# Patient Record
Sex: Female | Born: 1935 | Race: White | Hispanic: No | Smoking: Never smoker
Health system: Southern US, Community
[De-identification: ages and names within clinical notes are randomized; demographics above are authoritative.]

## PROBLEM LIST (undated history)

## (undated) DIAGNOSIS — D509 Iron deficiency anemia, unspecified: Secondary | ICD-10-CM

## (undated) DIAGNOSIS — I10 Essential (primary) hypertension: Secondary | ICD-10-CM

## (undated) DIAGNOSIS — K591 Functional diarrhea: Secondary | ICD-10-CM

## (undated) DIAGNOSIS — R1310 Dysphagia, unspecified: Secondary | ICD-10-CM

## (undated) DIAGNOSIS — R1319 Other dysphagia: Secondary | ICD-10-CM

## (undated) DIAGNOSIS — E039 Hypothyroidism, unspecified: Secondary | ICD-10-CM

## (undated) DIAGNOSIS — K219 Gastro-esophageal reflux disease without esophagitis: Secondary | ICD-10-CM

## (undated) HISTORY — DX: Gastro-esophageal reflux disease without esophagitis: K21.9

## (undated) HISTORY — DX: Iron deficiency anemia, unspecified: D50.9

## (undated) HISTORY — DX: Functional diarrhea: K59.1

## (undated) HISTORY — DX: Essential (primary) hypertension: I10

## (undated) HISTORY — PX: ABDOMINAL HYSTERECTOMY: SHX81

## (undated) HISTORY — DX: Other dysphagia: R13.19

## (undated) HISTORY — DX: Hypothyroidism, unspecified: E03.9

---
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
## 2011-01-09 DIAGNOSIS — R609 Edema, unspecified: Secondary | ICD-10-CM | POA: Diagnosis not present

## 2011-01-09 DIAGNOSIS — I10 Essential (primary) hypertension: Secondary | ICD-10-CM | POA: Diagnosis not present

## 2011-01-09 DIAGNOSIS — Z6828 Body mass index (BMI) 28.0-28.9, adult: Secondary | ICD-10-CM | POA: Diagnosis not present

## 2011-01-16 DIAGNOSIS — I8310 Varicose veins of unspecified lower extremity with inflammation: Secondary | ICD-10-CM | POA: Diagnosis not present

## 2011-01-16 DIAGNOSIS — J309 Allergic rhinitis, unspecified: Secondary | ICD-10-CM | POA: Diagnosis not present

## 2011-02-06 DIAGNOSIS — J309 Allergic rhinitis, unspecified: Secondary | ICD-10-CM | POA: Diagnosis not present

## 2011-02-27 DIAGNOSIS — J309 Allergic rhinitis, unspecified: Secondary | ICD-10-CM | POA: Diagnosis not present

## 2011-03-06 DIAGNOSIS — E785 Hyperlipidemia, unspecified: Secondary | ICD-10-CM | POA: Diagnosis not present

## 2011-03-06 DIAGNOSIS — I10 Essential (primary) hypertension: Secondary | ICD-10-CM | POA: Diagnosis not present

## 2011-03-06 DIAGNOSIS — E039 Hypothyroidism, unspecified: Secondary | ICD-10-CM | POA: Diagnosis not present

## 2011-03-06 DIAGNOSIS — Z79899 Other long term (current) drug therapy: Secondary | ICD-10-CM | POA: Diagnosis not present

## 2011-03-06 DIAGNOSIS — E119 Type 2 diabetes mellitus without complications: Secondary | ICD-10-CM | POA: Diagnosis not present

## 2011-03-06 DIAGNOSIS — Z6828 Body mass index (BMI) 28.0-28.9, adult: Secondary | ICD-10-CM | POA: Diagnosis not present

## 2011-03-06 DIAGNOSIS — R609 Edema, unspecified: Secondary | ICD-10-CM | POA: Diagnosis not present

## 2011-03-06 DIAGNOSIS — J309 Allergic rhinitis, unspecified: Secondary | ICD-10-CM | POA: Diagnosis not present

## 2011-03-10 DIAGNOSIS — M19049 Primary osteoarthritis, unspecified hand: Secondary | ICD-10-CM | POA: Diagnosis not present

## 2011-03-10 DIAGNOSIS — Z6827 Body mass index (BMI) 27.0-27.9, adult: Secondary | ICD-10-CM | POA: Diagnosis not present

## 2011-03-14 DIAGNOSIS — J309 Allergic rhinitis, unspecified: Secondary | ICD-10-CM | POA: Diagnosis not present

## 2011-04-09 DIAGNOSIS — J309 Allergic rhinitis, unspecified: Secondary | ICD-10-CM | POA: Diagnosis not present

## 2011-04-17 DIAGNOSIS — J309 Allergic rhinitis, unspecified: Secondary | ICD-10-CM | POA: Diagnosis not present

## 2011-04-24 DIAGNOSIS — J309 Allergic rhinitis, unspecified: Secondary | ICD-10-CM | POA: Diagnosis not present

## 2011-05-08 DIAGNOSIS — J309 Allergic rhinitis, unspecified: Secondary | ICD-10-CM | POA: Diagnosis not present

## 2011-05-16 DIAGNOSIS — K219 Gastro-esophageal reflux disease without esophagitis: Secondary | ICD-10-CM | POA: Diagnosis not present

## 2011-05-21 DIAGNOSIS — E039 Hypothyroidism, unspecified: Secondary | ICD-10-CM | POA: Diagnosis not present

## 2011-05-21 DIAGNOSIS — I10 Essential (primary) hypertension: Secondary | ICD-10-CM | POA: Diagnosis not present

## 2011-05-21 DIAGNOSIS — K222 Esophageal obstruction: Secondary | ICD-10-CM | POA: Diagnosis not present

## 2011-05-21 DIAGNOSIS — Z79899 Other long term (current) drug therapy: Secondary | ICD-10-CM | POA: Diagnosis not present

## 2011-05-21 DIAGNOSIS — K449 Diaphragmatic hernia without obstruction or gangrene: Secondary | ICD-10-CM | POA: Diagnosis not present

## 2011-05-21 DIAGNOSIS — Z7982 Long term (current) use of aspirin: Secondary | ICD-10-CM | POA: Diagnosis not present

## 2011-05-21 DIAGNOSIS — K219 Gastro-esophageal reflux disease without esophagitis: Secondary | ICD-10-CM | POA: Diagnosis not present

## 2011-05-21 DIAGNOSIS — Q391 Atresia of esophagus with tracheo-esophageal fistula: Secondary | ICD-10-CM | POA: Diagnosis not present

## 2011-05-21 DIAGNOSIS — R1310 Dysphagia, unspecified: Secondary | ICD-10-CM | POA: Diagnosis not present

## 2011-06-09 DIAGNOSIS — J309 Allergic rhinitis, unspecified: Secondary | ICD-10-CM | POA: Diagnosis not present

## 2011-06-11 DIAGNOSIS — R609 Edema, unspecified: Secondary | ICD-10-CM | POA: Diagnosis not present

## 2011-06-11 DIAGNOSIS — I10 Essential (primary) hypertension: Secondary | ICD-10-CM | POA: Diagnosis not present

## 2011-06-11 DIAGNOSIS — E039 Hypothyroidism, unspecified: Secondary | ICD-10-CM | POA: Diagnosis not present

## 2011-06-11 DIAGNOSIS — E785 Hyperlipidemia, unspecified: Secondary | ICD-10-CM | POA: Diagnosis not present

## 2011-06-11 DIAGNOSIS — Z6828 Body mass index (BMI) 28.0-28.9, adult: Secondary | ICD-10-CM | POA: Diagnosis not present

## 2011-06-11 DIAGNOSIS — E119 Type 2 diabetes mellitus without complications: Secondary | ICD-10-CM | POA: Diagnosis not present

## 2011-08-07 DIAGNOSIS — J309 Allergic rhinitis, unspecified: Secondary | ICD-10-CM | POA: Diagnosis not present

## 2011-08-12 DIAGNOSIS — E039 Hypothyroidism, unspecified: Secondary | ICD-10-CM | POA: Diagnosis not present

## 2011-09-03 DIAGNOSIS — H66009 Acute suppurative otitis media without spontaneous rupture of ear drum, unspecified ear: Secondary | ICD-10-CM | POA: Diagnosis not present

## 2011-09-03 DIAGNOSIS — H00019 Hordeolum externum unspecified eye, unspecified eyelid: Secondary | ICD-10-CM | POA: Diagnosis not present

## 2011-10-13 DIAGNOSIS — E119 Type 2 diabetes mellitus without complications: Secondary | ICD-10-CM | POA: Diagnosis not present

## 2011-10-13 DIAGNOSIS — H1045 Other chronic allergic conjunctivitis: Secondary | ICD-10-CM | POA: Diagnosis not present

## 2011-10-13 DIAGNOSIS — H2589 Other age-related cataract: Secondary | ICD-10-CM | POA: Diagnosis not present

## 2011-10-13 DIAGNOSIS — H524 Presbyopia: Secondary | ICD-10-CM | POA: Diagnosis not present

## 2011-10-13 DIAGNOSIS — H04129 Dry eye syndrome of unspecified lacrimal gland: Secondary | ICD-10-CM | POA: Diagnosis not present

## 2011-12-17 DIAGNOSIS — Z6829 Body mass index (BMI) 29.0-29.9, adult: Secondary | ICD-10-CM | POA: Diagnosis not present

## 2011-12-17 DIAGNOSIS — E119 Type 2 diabetes mellitus without complications: Secondary | ICD-10-CM | POA: Diagnosis not present

## 2011-12-17 DIAGNOSIS — I10 Essential (primary) hypertension: Secondary | ICD-10-CM | POA: Diagnosis not present

## 2011-12-17 DIAGNOSIS — R609 Edema, unspecified: Secondary | ICD-10-CM | POA: Diagnosis not present

## 2011-12-17 DIAGNOSIS — E785 Hyperlipidemia, unspecified: Secondary | ICD-10-CM | POA: Diagnosis not present

## 2011-12-17 DIAGNOSIS — E039 Hypothyroidism, unspecified: Secondary | ICD-10-CM | POA: Diagnosis not present

## 2012-01-06 DIAGNOSIS — Z1231 Encounter for screening mammogram for malignant neoplasm of breast: Secondary | ICD-10-CM | POA: Diagnosis not present

## 2012-02-10 DIAGNOSIS — R1013 Epigastric pain: Secondary | ICD-10-CM | POA: Diagnosis not present

## 2012-02-10 DIAGNOSIS — R112 Nausea with vomiting, unspecified: Secondary | ICD-10-CM | POA: Diagnosis not present

## 2012-02-10 DIAGNOSIS — K219 Gastro-esophageal reflux disease without esophagitis: Secondary | ICD-10-CM | POA: Diagnosis not present

## 2012-02-13 DIAGNOSIS — R1013 Epigastric pain: Secondary | ICD-10-CM | POA: Diagnosis not present

## 2012-04-01 DIAGNOSIS — I499 Cardiac arrhythmia, unspecified: Secondary | ICD-10-CM | POA: Diagnosis not present

## 2012-04-01 DIAGNOSIS — R112 Nausea with vomiting, unspecified: Secondary | ICD-10-CM | POA: Diagnosis not present

## 2012-04-01 DIAGNOSIS — K219 Gastro-esophageal reflux disease without esophagitis: Secondary | ICD-10-CM | POA: Diagnosis not present

## 2012-04-01 DIAGNOSIS — K2960 Other gastritis without bleeding: Secondary | ICD-10-CM | POA: Diagnosis not present

## 2012-04-01 DIAGNOSIS — K2289 Other specified disease of esophagus: Secondary | ICD-10-CM | POA: Diagnosis not present

## 2012-04-01 DIAGNOSIS — K449 Diaphragmatic hernia without obstruction or gangrene: Secondary | ICD-10-CM | POA: Diagnosis not present

## 2012-04-01 DIAGNOSIS — R1013 Epigastric pain: Secondary | ICD-10-CM | POA: Diagnosis not present

## 2012-04-01 DIAGNOSIS — R1310 Dysphagia, unspecified: Secondary | ICD-10-CM | POA: Diagnosis not present

## 2012-04-01 DIAGNOSIS — R9431 Abnormal electrocardiogram [ECG] [EKG]: Secondary | ICD-10-CM | POA: Diagnosis not present

## 2012-04-01 DIAGNOSIS — K222 Esophageal obstruction: Secondary | ICD-10-CM | POA: Diagnosis not present

## 2012-04-07 DIAGNOSIS — I10 Essential (primary) hypertension: Secondary | ICD-10-CM | POA: Diagnosis not present

## 2012-04-07 DIAGNOSIS — R9431 Abnormal electrocardiogram [ECG] [EKG]: Secondary | ICD-10-CM | POA: Diagnosis not present

## 2012-04-07 DIAGNOSIS — R0602 Shortness of breath: Secondary | ICD-10-CM | POA: Diagnosis not present

## 2012-04-07 DIAGNOSIS — Z809 Family history of malignant neoplasm, unspecified: Secondary | ICD-10-CM | POA: Diagnosis not present

## 2012-04-07 DIAGNOSIS — R002 Palpitations: Secondary | ICD-10-CM | POA: Diagnosis not present

## 2012-04-07 DIAGNOSIS — Z833 Family history of diabetes mellitus: Secondary | ICD-10-CM | POA: Diagnosis not present

## 2012-04-07 DIAGNOSIS — Z8249 Family history of ischemic heart disease and other diseases of the circulatory system: Secondary | ICD-10-CM | POA: Diagnosis not present

## 2012-04-08 DIAGNOSIS — R0602 Shortness of breath: Secondary | ICD-10-CM | POA: Diagnosis not present

## 2012-04-08 DIAGNOSIS — R9431 Abnormal electrocardiogram [ECG] [EKG]: Secondary | ICD-10-CM | POA: Diagnosis not present

## 2012-04-08 DIAGNOSIS — R002 Palpitations: Secondary | ICD-10-CM | POA: Diagnosis not present

## 2012-04-12 DIAGNOSIS — R112 Nausea with vomiting, unspecified: Secondary | ICD-10-CM | POA: Diagnosis not present

## 2012-04-12 DIAGNOSIS — K219 Gastro-esophageal reflux disease without esophagitis: Secondary | ICD-10-CM | POA: Diagnosis not present

## 2012-04-12 DIAGNOSIS — R1013 Epigastric pain: Secondary | ICD-10-CM | POA: Diagnosis not present

## 2012-05-06 DIAGNOSIS — D509 Iron deficiency anemia, unspecified: Secondary | ICD-10-CM | POA: Diagnosis not present

## 2012-05-12 DIAGNOSIS — D509 Iron deficiency anemia, unspecified: Secondary | ICD-10-CM | POA: Diagnosis not present

## 2012-05-12 DIAGNOSIS — R002 Palpitations: Secondary | ICD-10-CM | POA: Diagnosis not present

## 2012-05-12 DIAGNOSIS — Z6828 Body mass index (BMI) 28.0-28.9, adult: Secondary | ICD-10-CM | POA: Diagnosis not present

## 2012-05-12 DIAGNOSIS — E039 Hypothyroidism, unspecified: Secondary | ICD-10-CM | POA: Diagnosis not present

## 2012-05-12 DIAGNOSIS — I10 Essential (primary) hypertension: Secondary | ICD-10-CM | POA: Diagnosis not present

## 2012-05-12 DIAGNOSIS — R42 Dizziness and giddiness: Secondary | ICD-10-CM | POA: Diagnosis not present

## 2012-05-20 DIAGNOSIS — R002 Palpitations: Secondary | ICD-10-CM | POA: Diagnosis not present

## 2012-05-20 DIAGNOSIS — R0602 Shortness of breath: Secondary | ICD-10-CM | POA: Diagnosis not present

## 2012-05-20 DIAGNOSIS — R9431 Abnormal electrocardiogram [ECG] [EKG]: Secondary | ICD-10-CM | POA: Diagnosis not present

## 2012-06-07 DIAGNOSIS — I10 Essential (primary) hypertension: Secondary | ICD-10-CM | POA: Diagnosis not present

## 2012-06-07 DIAGNOSIS — D126 Benign neoplasm of colon, unspecified: Secondary | ICD-10-CM | POA: Diagnosis not present

## 2012-06-07 DIAGNOSIS — K219 Gastro-esophageal reflux disease without esophagitis: Secondary | ICD-10-CM | POA: Diagnosis not present

## 2012-06-07 DIAGNOSIS — D509 Iron deficiency anemia, unspecified: Secondary | ICD-10-CM | POA: Diagnosis not present

## 2012-06-07 DIAGNOSIS — K648 Other hemorrhoids: Secondary | ICD-10-CM | POA: Diagnosis not present

## 2012-06-07 DIAGNOSIS — E039 Hypothyroidism, unspecified: Secondary | ICD-10-CM | POA: Diagnosis not present

## 2012-06-07 DIAGNOSIS — Z79899 Other long term (current) drug therapy: Secondary | ICD-10-CM | POA: Diagnosis not present

## 2012-06-07 DIAGNOSIS — K5730 Diverticulosis of large intestine without perforation or abscess without bleeding: Secondary | ICD-10-CM | POA: Diagnosis not present

## 2012-06-07 DIAGNOSIS — I4891 Unspecified atrial fibrillation: Secondary | ICD-10-CM | POA: Diagnosis not present

## 2012-06-07 HISTORY — PX: COLONOSCOPY: SHX174

## 2012-06-24 DIAGNOSIS — Z1331 Encounter for screening for depression: Secondary | ICD-10-CM | POA: Diagnosis not present

## 2012-06-24 DIAGNOSIS — E039 Hypothyroidism, unspecified: Secondary | ICD-10-CM | POA: Diagnosis not present

## 2012-06-24 DIAGNOSIS — D509 Iron deficiency anemia, unspecified: Secondary | ICD-10-CM | POA: Diagnosis not present

## 2012-06-24 DIAGNOSIS — E119 Type 2 diabetes mellitus without complications: Secondary | ICD-10-CM | POA: Diagnosis not present

## 2012-06-24 DIAGNOSIS — I10 Essential (primary) hypertension: Secondary | ICD-10-CM | POA: Diagnosis not present

## 2012-06-24 DIAGNOSIS — Z6828 Body mass index (BMI) 28.0-28.9, adult: Secondary | ICD-10-CM | POA: Diagnosis not present

## 2012-06-24 DIAGNOSIS — Z9181 History of falling: Secondary | ICD-10-CM | POA: Diagnosis not present

## 2012-06-24 DIAGNOSIS — E785 Hyperlipidemia, unspecified: Secondary | ICD-10-CM | POA: Diagnosis not present

## 2013-01-07 DIAGNOSIS — Z23 Encounter for immunization: Secondary | ICD-10-CM | POA: Diagnosis not present

## 2013-01-07 DIAGNOSIS — E039 Hypothyroidism, unspecified: Secondary | ICD-10-CM | POA: Diagnosis not present

## 2013-01-07 DIAGNOSIS — E119 Type 2 diabetes mellitus without complications: Secondary | ICD-10-CM | POA: Diagnosis not present

## 2013-01-07 DIAGNOSIS — Z6829 Body mass index (BMI) 29.0-29.9, adult: Secondary | ICD-10-CM | POA: Diagnosis not present

## 2013-01-07 DIAGNOSIS — I10 Essential (primary) hypertension: Secondary | ICD-10-CM | POA: Diagnosis not present

## 2013-01-07 DIAGNOSIS — E785 Hyperlipidemia, unspecified: Secondary | ICD-10-CM | POA: Diagnosis not present

## 2013-01-07 DIAGNOSIS — D509 Iron deficiency anemia, unspecified: Secondary | ICD-10-CM | POA: Diagnosis not present

## 2013-01-26 DIAGNOSIS — H5789 Other specified disorders of eye and adnexa: Secondary | ICD-10-CM | POA: Diagnosis not present

## 2013-02-16 DIAGNOSIS — H01009 Unspecified blepharitis unspecified eye, unspecified eyelid: Secondary | ICD-10-CM | POA: Diagnosis not present

## 2013-03-30 DIAGNOSIS — M19039 Primary osteoarthritis, unspecified wrist: Secondary | ICD-10-CM | POA: Diagnosis not present

## 2013-03-30 DIAGNOSIS — M19049 Primary osteoarthritis, unspecified hand: Secondary | ICD-10-CM | POA: Diagnosis not present

## 2013-03-31 DIAGNOSIS — H04229 Epiphora due to insufficient drainage, unspecified lacrimal gland: Secondary | ICD-10-CM | POA: Diagnosis not present

## 2013-03-31 DIAGNOSIS — H04569 Stenosis of unspecified lacrimal punctum: Secondary | ICD-10-CM | POA: Diagnosis not present

## 2013-07-13 DIAGNOSIS — Z9181 History of falling: Secondary | ICD-10-CM | POA: Diagnosis not present

## 2013-07-13 DIAGNOSIS — Z6829 Body mass index (BMI) 29.0-29.9, adult: Secondary | ICD-10-CM | POA: Diagnosis not present

## 2013-07-13 DIAGNOSIS — E039 Hypothyroidism, unspecified: Secondary | ICD-10-CM | POA: Diagnosis not present

## 2013-07-13 DIAGNOSIS — E119 Type 2 diabetes mellitus without complications: Secondary | ICD-10-CM | POA: Diagnosis not present

## 2013-07-13 DIAGNOSIS — E785 Hyperlipidemia, unspecified: Secondary | ICD-10-CM | POA: Diagnosis not present

## 2013-07-13 DIAGNOSIS — I10 Essential (primary) hypertension: Secondary | ICD-10-CM | POA: Diagnosis not present

## 2013-07-13 DIAGNOSIS — D509 Iron deficiency anemia, unspecified: Secondary | ICD-10-CM | POA: Diagnosis not present

## 2013-07-13 DIAGNOSIS — Z1331 Encounter for screening for depression: Secondary | ICD-10-CM | POA: Diagnosis not present

## 2013-07-19 DIAGNOSIS — H04569 Stenosis of unspecified lacrimal punctum: Secondary | ICD-10-CM | POA: Diagnosis not present

## 2013-08-08 DIAGNOSIS — M810 Age-related osteoporosis without current pathological fracture: Secondary | ICD-10-CM | POA: Diagnosis not present

## 2013-08-08 DIAGNOSIS — Z13820 Encounter for screening for osteoporosis: Secondary | ICD-10-CM | POA: Diagnosis not present

## 2013-08-08 DIAGNOSIS — M899 Disorder of bone, unspecified: Secondary | ICD-10-CM | POA: Diagnosis not present

## 2013-08-16 DIAGNOSIS — H02839 Dermatochalasis of unspecified eye, unspecified eyelid: Secondary | ICD-10-CM | POA: Diagnosis not present

## 2013-08-23 DIAGNOSIS — E039 Hypothyroidism, unspecified: Secondary | ICD-10-CM | POA: Diagnosis not present

## 2013-08-23 DIAGNOSIS — D509 Iron deficiency anemia, unspecified: Secondary | ICD-10-CM | POA: Diagnosis not present

## 2013-09-20 DIAGNOSIS — E039 Hypothyroidism, unspecified: Secondary | ICD-10-CM | POA: Diagnosis not present

## 2013-10-18 DIAGNOSIS — E039 Hypothyroidism, unspecified: Secondary | ICD-10-CM | POA: Diagnosis not present

## 2013-11-01 DIAGNOSIS — H02834 Dermatochalasis of left upper eyelid: Secondary | ICD-10-CM | POA: Diagnosis not present

## 2013-11-01 DIAGNOSIS — H02831 Dermatochalasis of right upper eyelid: Secondary | ICD-10-CM | POA: Diagnosis not present

## 2013-11-22 DIAGNOSIS — E039 Hypothyroidism, unspecified: Secondary | ICD-10-CM | POA: Diagnosis not present

## 2013-11-22 DIAGNOSIS — L720 Epidermal cyst: Secondary | ICD-10-CM | POA: Diagnosis not present

## 2013-11-22 DIAGNOSIS — L821 Other seborrheic keratosis: Secondary | ICD-10-CM | POA: Diagnosis not present

## 2013-11-22 DIAGNOSIS — L820 Inflamed seborrheic keratosis: Secondary | ICD-10-CM | POA: Diagnosis not present

## 2013-12-15 DIAGNOSIS — D485 Neoplasm of uncertain behavior of skin: Secondary | ICD-10-CM | POA: Diagnosis not present

## 2013-12-21 DIAGNOSIS — J189 Pneumonia, unspecified organism: Secondary | ICD-10-CM | POA: Diagnosis not present

## 2013-12-21 DIAGNOSIS — J069 Acute upper respiratory infection, unspecified: Secondary | ICD-10-CM | POA: Diagnosis not present

## 2014-01-17 DIAGNOSIS — E611 Iron deficiency: Secondary | ICD-10-CM | POA: Diagnosis not present

## 2014-01-17 DIAGNOSIS — E039 Hypothyroidism, unspecified: Secondary | ICD-10-CM | POA: Diagnosis not present

## 2014-01-17 DIAGNOSIS — E785 Hyperlipidemia, unspecified: Secondary | ICD-10-CM | POA: Diagnosis not present

## 2014-01-17 DIAGNOSIS — Z23 Encounter for immunization: Secondary | ICD-10-CM | POA: Diagnosis not present

## 2014-01-17 DIAGNOSIS — I10 Essential (primary) hypertension: Secondary | ICD-10-CM | POA: Diagnosis not present

## 2014-01-17 DIAGNOSIS — Z6828 Body mass index (BMI) 28.0-28.9, adult: Secondary | ICD-10-CM | POA: Diagnosis not present

## 2014-01-17 DIAGNOSIS — E119 Type 2 diabetes mellitus without complications: Secondary | ICD-10-CM | POA: Diagnosis not present

## 2014-07-18 DIAGNOSIS — E611 Iron deficiency: Secondary | ICD-10-CM | POA: Diagnosis not present

## 2014-07-18 DIAGNOSIS — E119 Type 2 diabetes mellitus without complications: Secondary | ICD-10-CM | POA: Diagnosis not present

## 2014-07-18 DIAGNOSIS — E039 Hypothyroidism, unspecified: Secondary | ICD-10-CM | POA: Diagnosis not present

## 2014-07-18 DIAGNOSIS — I10 Essential (primary) hypertension: Secondary | ICD-10-CM | POA: Diagnosis not present

## 2014-07-18 DIAGNOSIS — E785 Hyperlipidemia, unspecified: Secondary | ICD-10-CM | POA: Diagnosis not present

## 2014-07-18 DIAGNOSIS — Z9181 History of falling: Secondary | ICD-10-CM | POA: Diagnosis not present

## 2014-07-18 DIAGNOSIS — Z6829 Body mass index (BMI) 29.0-29.9, adult: Secondary | ICD-10-CM | POA: Diagnosis not present

## 2014-08-09 DIAGNOSIS — M545 Low back pain: Secondary | ICD-10-CM | POA: Diagnosis not present

## 2014-08-09 DIAGNOSIS — M9905 Segmental and somatic dysfunction of pelvic region: Secondary | ICD-10-CM | POA: Diagnosis not present

## 2014-08-09 DIAGNOSIS — M9903 Segmental and somatic dysfunction of lumbar region: Secondary | ICD-10-CM | POA: Diagnosis not present

## 2014-08-09 DIAGNOSIS — M9902 Segmental and somatic dysfunction of thoracic region: Secondary | ICD-10-CM | POA: Diagnosis not present

## 2014-08-11 DIAGNOSIS — M545 Low back pain: Secondary | ICD-10-CM | POA: Diagnosis not present

## 2014-08-11 DIAGNOSIS — M9902 Segmental and somatic dysfunction of thoracic region: Secondary | ICD-10-CM | POA: Diagnosis not present

## 2014-08-11 DIAGNOSIS — M9903 Segmental and somatic dysfunction of lumbar region: Secondary | ICD-10-CM | POA: Diagnosis not present

## 2014-08-11 DIAGNOSIS — M9905 Segmental and somatic dysfunction of pelvic region: Secondary | ICD-10-CM | POA: Diagnosis not present

## 2014-08-16 DIAGNOSIS — M9903 Segmental and somatic dysfunction of lumbar region: Secondary | ICD-10-CM | POA: Diagnosis not present

## 2014-08-16 DIAGNOSIS — M9905 Segmental and somatic dysfunction of pelvic region: Secondary | ICD-10-CM | POA: Diagnosis not present

## 2014-08-16 DIAGNOSIS — M545 Low back pain: Secondary | ICD-10-CM | POA: Diagnosis not present

## 2014-08-16 DIAGNOSIS — M9902 Segmental and somatic dysfunction of thoracic region: Secondary | ICD-10-CM | POA: Diagnosis not present

## 2014-08-29 DIAGNOSIS — M25561 Pain in right knee: Secondary | ICD-10-CM | POA: Diagnosis not present

## 2014-08-29 DIAGNOSIS — M25861 Other specified joint disorders, right knee: Secondary | ICD-10-CM | POA: Diagnosis not present

## 2014-08-29 DIAGNOSIS — Z6829 Body mass index (BMI) 29.0-29.9, adult: Secondary | ICD-10-CM | POA: Diagnosis not present

## 2014-08-30 DIAGNOSIS — M25561 Pain in right knee: Secondary | ICD-10-CM | POA: Diagnosis not present

## 2014-08-30 DIAGNOSIS — M1711 Unilateral primary osteoarthritis, right knee: Secondary | ICD-10-CM | POA: Diagnosis not present

## 2014-08-30 DIAGNOSIS — R2241 Localized swelling, mass and lump, right lower limb: Secondary | ICD-10-CM | POA: Diagnosis not present

## 2014-08-30 DIAGNOSIS — M25861 Other specified joint disorders, right knee: Secondary | ICD-10-CM | POA: Diagnosis not present

## 2014-09-15 DIAGNOSIS — M179 Osteoarthritis of knee, unspecified: Secondary | ICD-10-CM | POA: Diagnosis not present

## 2014-09-15 DIAGNOSIS — X58XXXA Exposure to other specified factors, initial encounter: Secondary | ICD-10-CM | POA: Diagnosis not present

## 2014-09-15 DIAGNOSIS — S83281A Other tear of lateral meniscus, current injury, right knee, initial encounter: Secondary | ICD-10-CM | POA: Diagnosis not present

## 2014-09-15 DIAGNOSIS — M94261 Chondromalacia, right knee: Secondary | ICD-10-CM | POA: Diagnosis not present

## 2014-09-15 DIAGNOSIS — S83241A Other tear of medial meniscus, current injury, right knee, initial encounter: Secondary | ICD-10-CM | POA: Diagnosis not present

## 2014-09-15 DIAGNOSIS — M7121 Synovial cyst of popliteal space [Baker], right knee: Secondary | ICD-10-CM | POA: Diagnosis not present

## 2014-09-15 DIAGNOSIS — M25461 Effusion, right knee: Secondary | ICD-10-CM | POA: Diagnosis not present

## 2014-09-25 DIAGNOSIS — S83281A Other tear of lateral meniscus, current injury, right knee, initial encounter: Secondary | ICD-10-CM | POA: Diagnosis not present

## 2014-09-25 DIAGNOSIS — M1711 Unilateral primary osteoarthritis, right knee: Secondary | ICD-10-CM | POA: Diagnosis not present

## 2014-11-08 DIAGNOSIS — E119 Type 2 diabetes mellitus without complications: Secondary | ICD-10-CM | POA: Diagnosis not present

## 2014-11-08 DIAGNOSIS — Z6828 Body mass index (BMI) 28.0-28.9, adult: Secondary | ICD-10-CM | POA: Diagnosis not present

## 2014-11-08 DIAGNOSIS — I4510 Unspecified right bundle-branch block: Secondary | ICD-10-CM | POA: Diagnosis not present

## 2014-11-08 DIAGNOSIS — I10 Essential (primary) hypertension: Secondary | ICD-10-CM | POA: Diagnosis not present

## 2014-11-08 DIAGNOSIS — S83281A Other tear of lateral meniscus, current injury, right knee, initial encounter: Secondary | ICD-10-CM | POA: Diagnosis not present

## 2014-11-08 DIAGNOSIS — M1711 Unilateral primary osteoarthritis, right knee: Secondary | ICD-10-CM | POA: Diagnosis not present

## 2014-11-08 DIAGNOSIS — Z23 Encounter for immunization: Secondary | ICD-10-CM | POA: Diagnosis not present

## 2014-11-17 DIAGNOSIS — E119 Type 2 diabetes mellitus without complications: Secondary | ICD-10-CM | POA: Diagnosis not present

## 2014-11-17 DIAGNOSIS — I10 Essential (primary) hypertension: Secondary | ICD-10-CM | POA: Diagnosis not present

## 2014-11-17 DIAGNOSIS — M659 Synovitis and tenosynovitis, unspecified: Secondary | ICD-10-CM | POA: Diagnosis not present

## 2014-11-17 DIAGNOSIS — J309 Allergic rhinitis, unspecified: Secondary | ICD-10-CM | POA: Diagnosis not present

## 2014-11-17 DIAGNOSIS — S83271A Complex tear of lateral meniscus, current injury, right knee, initial encounter: Secondary | ICD-10-CM | POA: Diagnosis not present

## 2014-11-17 DIAGNOSIS — E785 Hyperlipidemia, unspecified: Secondary | ICD-10-CM | POA: Diagnosis not present

## 2014-11-17 DIAGNOSIS — G4700 Insomnia, unspecified: Secondary | ICD-10-CM | POA: Diagnosis not present

## 2014-11-17 DIAGNOSIS — S83281A Other tear of lateral meniscus, current injury, right knee, initial encounter: Secondary | ICD-10-CM | POA: Diagnosis not present

## 2014-11-17 DIAGNOSIS — Z7982 Long term (current) use of aspirin: Secondary | ICD-10-CM | POA: Diagnosis not present

## 2014-11-17 DIAGNOSIS — E039 Hypothyroidism, unspecified: Secondary | ICD-10-CM | POA: Diagnosis not present

## 2014-11-17 DIAGNOSIS — M23200 Derangement of unspecified lateral meniscus due to old tear or injury, right knee: Secondary | ICD-10-CM | POA: Diagnosis not present

## 2014-11-17 DIAGNOSIS — M1711 Unilateral primary osteoarthritis, right knee: Secondary | ICD-10-CM | POA: Diagnosis not present

## 2014-11-17 DIAGNOSIS — G2581 Restless legs syndrome: Secondary | ICD-10-CM | POA: Diagnosis not present

## 2014-11-17 DIAGNOSIS — Z79899 Other long term (current) drug therapy: Secondary | ICD-10-CM | POA: Diagnosis not present

## 2014-11-17 DIAGNOSIS — K219 Gastro-esophageal reflux disease without esophagitis: Secondary | ICD-10-CM | POA: Diagnosis not present

## 2014-11-20 DIAGNOSIS — M25561 Pain in right knee: Secondary | ICD-10-CM | POA: Diagnosis not present

## 2014-11-20 DIAGNOSIS — R262 Difficulty in walking, not elsewhere classified: Secondary | ICD-10-CM | POA: Diagnosis not present

## 2014-11-22 DIAGNOSIS — J208 Acute bronchitis due to other specified organisms: Secondary | ICD-10-CM | POA: Diagnosis not present

## 2014-11-22 DIAGNOSIS — Z6829 Body mass index (BMI) 29.0-29.9, adult: Secondary | ICD-10-CM | POA: Diagnosis not present

## 2014-11-23 DIAGNOSIS — M25561 Pain in right knee: Secondary | ICD-10-CM | POA: Diagnosis not present

## 2014-11-23 DIAGNOSIS — R262 Difficulty in walking, not elsewhere classified: Secondary | ICD-10-CM | POA: Diagnosis not present

## 2014-11-27 DIAGNOSIS — M79604 Pain in right leg: Secondary | ICD-10-CM | POA: Diagnosis not present

## 2014-11-27 DIAGNOSIS — M79661 Pain in right lower leg: Secondary | ICD-10-CM | POA: Diagnosis not present

## 2014-11-27 DIAGNOSIS — R600 Localized edema: Secondary | ICD-10-CM | POA: Diagnosis not present

## 2014-11-27 DIAGNOSIS — R609 Edema, unspecified: Secondary | ICD-10-CM | POA: Diagnosis not present

## 2014-11-27 DIAGNOSIS — M25561 Pain in right knee: Secondary | ICD-10-CM | POA: Diagnosis not present

## 2014-11-28 DIAGNOSIS — R262 Difficulty in walking, not elsewhere classified: Secondary | ICD-10-CM | POA: Diagnosis not present

## 2014-11-28 DIAGNOSIS — M25561 Pain in right knee: Secondary | ICD-10-CM | POA: Diagnosis not present

## 2014-12-07 DIAGNOSIS — G8929 Other chronic pain: Secondary | ICD-10-CM | POA: Diagnosis not present

## 2014-12-07 DIAGNOSIS — M25561 Pain in right knee: Secondary | ICD-10-CM | POA: Diagnosis not present

## 2014-12-12 DIAGNOSIS — R262 Difficulty in walking, not elsewhere classified: Secondary | ICD-10-CM | POA: Diagnosis not present

## 2014-12-12 DIAGNOSIS — M25561 Pain in right knee: Secondary | ICD-10-CM | POA: Diagnosis not present

## 2014-12-14 DIAGNOSIS — R262 Difficulty in walking, not elsewhere classified: Secondary | ICD-10-CM | POA: Diagnosis not present

## 2014-12-14 DIAGNOSIS — M25561 Pain in right knee: Secondary | ICD-10-CM | POA: Diagnosis not present

## 2014-12-18 DIAGNOSIS — M1711 Unilateral primary osteoarthritis, right knee: Secondary | ICD-10-CM | POA: Diagnosis not present

## 2014-12-21 DIAGNOSIS — Z79899 Other long term (current) drug therapy: Secondary | ICD-10-CM | POA: Diagnosis not present

## 2014-12-21 DIAGNOSIS — Z01818 Encounter for other preprocedural examination: Secondary | ICD-10-CM | POA: Diagnosis not present

## 2014-12-21 DIAGNOSIS — Z01810 Encounter for preprocedural cardiovascular examination: Secondary | ICD-10-CM | POA: Diagnosis not present

## 2014-12-21 DIAGNOSIS — M79609 Pain in unspecified limb: Secondary | ICD-10-CM | POA: Diagnosis not present

## 2014-12-21 DIAGNOSIS — I4510 Unspecified right bundle-branch block: Secondary | ICD-10-CM | POA: Diagnosis not present

## 2014-12-21 DIAGNOSIS — R001 Bradycardia, unspecified: Secondary | ICD-10-CM | POA: Diagnosis not present

## 2015-01-02 DIAGNOSIS — K219 Gastro-esophageal reflux disease without esophagitis: Secondary | ICD-10-CM | POA: Diagnosis present

## 2015-01-02 DIAGNOSIS — M25569 Pain in unspecified knee: Secondary | ICD-10-CM | POA: Diagnosis not present

## 2015-01-02 DIAGNOSIS — Z471 Aftercare following joint replacement surgery: Secondary | ICD-10-CM | POA: Diagnosis not present

## 2015-01-02 DIAGNOSIS — Z96651 Presence of right artificial knee joint: Secondary | ICD-10-CM | POA: Diagnosis not present

## 2015-01-02 DIAGNOSIS — Z96652 Presence of left artificial knee joint: Secondary | ICD-10-CM | POA: Diagnosis not present

## 2015-01-02 DIAGNOSIS — J309 Allergic rhinitis, unspecified: Secondary | ICD-10-CM | POA: Diagnosis present

## 2015-01-02 DIAGNOSIS — Z862 Personal history of diseases of the blood and blood-forming organs and certain disorders involving the immune mechanism: Secondary | ICD-10-CM | POA: Diagnosis not present

## 2015-01-02 DIAGNOSIS — E119 Type 2 diabetes mellitus without complications: Secondary | ICD-10-CM | POA: Diagnosis not present

## 2015-01-02 DIAGNOSIS — Z79899 Other long term (current) drug therapy: Secondary | ICD-10-CM | POA: Diagnosis not present

## 2015-01-02 DIAGNOSIS — Z7982 Long term (current) use of aspirin: Secondary | ICD-10-CM | POA: Diagnosis not present

## 2015-01-02 DIAGNOSIS — I4891 Unspecified atrial fibrillation: Secondary | ICD-10-CM | POA: Diagnosis present

## 2015-01-02 DIAGNOSIS — I10 Essential (primary) hypertension: Secondary | ICD-10-CM | POA: Diagnosis present

## 2015-01-02 DIAGNOSIS — E039 Hypothyroidism, unspecified: Secondary | ICD-10-CM | POA: Diagnosis present

## 2015-01-02 DIAGNOSIS — M1711 Unilateral primary osteoarthritis, right knee: Secondary | ICD-10-CM | POA: Diagnosis not present

## 2015-01-02 DIAGNOSIS — G2581 Restless legs syndrome: Secondary | ICD-10-CM | POA: Diagnosis present

## 2015-01-02 DIAGNOSIS — E785 Hyperlipidemia, unspecified: Secondary | ICD-10-CM | POA: Diagnosis present

## 2015-01-02 HISTORY — PX: TOTAL KNEE ARTHROPLASTY: SHX125

## 2015-01-05 DIAGNOSIS — Z96652 Presence of left artificial knee joint: Secondary | ICD-10-CM | POA: Diagnosis not present

## 2015-01-05 DIAGNOSIS — Z471 Aftercare following joint replacement surgery: Secondary | ICD-10-CM | POA: Diagnosis not present

## 2015-01-05 DIAGNOSIS — R262 Difficulty in walking, not elsewhere classified: Secondary | ICD-10-CM | POA: Diagnosis not present

## 2015-01-05 DIAGNOSIS — M25569 Pain in unspecified knee: Secondary | ICD-10-CM | POA: Diagnosis not present

## 2015-01-05 DIAGNOSIS — G8918 Other acute postprocedural pain: Secondary | ICD-10-CM | POA: Diagnosis not present

## 2015-01-05 DIAGNOSIS — D649 Anemia, unspecified: Secondary | ICD-10-CM | POA: Diagnosis not present

## 2015-01-08 DIAGNOSIS — R262 Difficulty in walking, not elsewhere classified: Secondary | ICD-10-CM | POA: Diagnosis not present

## 2015-01-08 DIAGNOSIS — G8918 Other acute postprocedural pain: Secondary | ICD-10-CM | POA: Diagnosis not present

## 2015-01-08 DIAGNOSIS — D649 Anemia, unspecified: Secondary | ICD-10-CM | POA: Diagnosis not present

## 2015-01-08 DIAGNOSIS — Z471 Aftercare following joint replacement surgery: Secondary | ICD-10-CM | POA: Diagnosis not present

## 2015-01-16 DIAGNOSIS — Z7982 Long term (current) use of aspirin: Secondary | ICD-10-CM | POA: Diagnosis not present

## 2015-01-16 DIAGNOSIS — I10 Essential (primary) hypertension: Secondary | ICD-10-CM | POA: Diagnosis not present

## 2015-01-16 DIAGNOSIS — Z471 Aftercare following joint replacement surgery: Secondary | ICD-10-CM | POA: Diagnosis not present

## 2015-01-16 DIAGNOSIS — Z96651 Presence of right artificial knee joint: Secondary | ICD-10-CM | POA: Diagnosis not present

## 2015-01-16 DIAGNOSIS — Z602 Problems related to living alone: Secondary | ICD-10-CM | POA: Diagnosis not present

## 2015-01-16 DIAGNOSIS — F419 Anxiety disorder, unspecified: Secondary | ICD-10-CM | POA: Diagnosis not present

## 2015-01-18 DIAGNOSIS — Z471 Aftercare following joint replacement surgery: Secondary | ICD-10-CM | POA: Diagnosis not present

## 2015-01-18 DIAGNOSIS — Z7982 Long term (current) use of aspirin: Secondary | ICD-10-CM | POA: Diagnosis not present

## 2015-01-18 DIAGNOSIS — I10 Essential (primary) hypertension: Secondary | ICD-10-CM | POA: Diagnosis not present

## 2015-01-18 DIAGNOSIS — Z602 Problems related to living alone: Secondary | ICD-10-CM | POA: Diagnosis not present

## 2015-01-18 DIAGNOSIS — F419 Anxiety disorder, unspecified: Secondary | ICD-10-CM | POA: Diagnosis not present

## 2015-01-18 DIAGNOSIS — Z96651 Presence of right artificial knee joint: Secondary | ICD-10-CM | POA: Diagnosis not present

## 2015-01-22 DIAGNOSIS — M25561 Pain in right knee: Secondary | ICD-10-CM | POA: Diagnosis not present

## 2015-01-22 DIAGNOSIS — M6281 Muscle weakness (generalized): Secondary | ICD-10-CM | POA: Diagnosis not present

## 2015-01-22 DIAGNOSIS — R2689 Other abnormalities of gait and mobility: Secondary | ICD-10-CM | POA: Diagnosis not present

## 2015-01-22 DIAGNOSIS — M25661 Stiffness of right knee, not elsewhere classified: Secondary | ICD-10-CM | POA: Diagnosis not present

## 2015-01-23 DIAGNOSIS — E039 Hypothyroidism, unspecified: Secondary | ICD-10-CM | POA: Diagnosis not present

## 2015-01-23 DIAGNOSIS — Z6826 Body mass index (BMI) 26.0-26.9, adult: Secondary | ICD-10-CM | POA: Diagnosis not present

## 2015-01-23 DIAGNOSIS — I10 Essential (primary) hypertension: Secondary | ICD-10-CM | POA: Diagnosis not present

## 2015-01-23 DIAGNOSIS — D62 Acute posthemorrhagic anemia: Secondary | ICD-10-CM | POA: Diagnosis not present

## 2015-01-23 DIAGNOSIS — G250 Essential tremor: Secondary | ICD-10-CM | POA: Diagnosis not present

## 2015-01-24 DIAGNOSIS — M25561 Pain in right knee: Secondary | ICD-10-CM | POA: Diagnosis not present

## 2015-01-24 DIAGNOSIS — M25661 Stiffness of right knee, not elsewhere classified: Secondary | ICD-10-CM | POA: Diagnosis not present

## 2015-01-24 DIAGNOSIS — M6281 Muscle weakness (generalized): Secondary | ICD-10-CM | POA: Diagnosis not present

## 2015-01-24 DIAGNOSIS — R2689 Other abnormalities of gait and mobility: Secondary | ICD-10-CM | POA: Diagnosis not present

## 2015-01-26 DIAGNOSIS — R197 Diarrhea, unspecified: Secondary | ICD-10-CM | POA: Diagnosis not present

## 2015-01-30 DIAGNOSIS — R197 Diarrhea, unspecified: Secondary | ICD-10-CM | POA: Diagnosis not present

## 2015-01-30 DIAGNOSIS — R2689 Other abnormalities of gait and mobility: Secondary | ICD-10-CM | POA: Diagnosis not present

## 2015-01-30 DIAGNOSIS — M6281 Muscle weakness (generalized): Secondary | ICD-10-CM | POA: Diagnosis not present

## 2015-01-30 DIAGNOSIS — M25561 Pain in right knee: Secondary | ICD-10-CM | POA: Diagnosis not present

## 2015-01-30 DIAGNOSIS — M25661 Stiffness of right knee, not elsewhere classified: Secondary | ICD-10-CM | POA: Diagnosis not present

## 2015-02-01 DIAGNOSIS — M25561 Pain in right knee: Secondary | ICD-10-CM | POA: Diagnosis not present

## 2015-02-01 DIAGNOSIS — M6281 Muscle weakness (generalized): Secondary | ICD-10-CM | POA: Diagnosis not present

## 2015-02-01 DIAGNOSIS — R2689 Other abnormalities of gait and mobility: Secondary | ICD-10-CM | POA: Diagnosis not present

## 2015-02-01 DIAGNOSIS — M25661 Stiffness of right knee, not elsewhere classified: Secondary | ICD-10-CM | POA: Diagnosis not present

## 2015-02-05 DIAGNOSIS — M25661 Stiffness of right knee, not elsewhere classified: Secondary | ICD-10-CM | POA: Diagnosis not present

## 2015-02-05 DIAGNOSIS — M6281 Muscle weakness (generalized): Secondary | ICD-10-CM | POA: Diagnosis not present

## 2015-02-05 DIAGNOSIS — M25561 Pain in right knee: Secondary | ICD-10-CM | POA: Diagnosis not present

## 2015-02-05 DIAGNOSIS — R2689 Other abnormalities of gait and mobility: Secondary | ICD-10-CM | POA: Diagnosis not present

## 2015-02-08 DIAGNOSIS — M25661 Stiffness of right knee, not elsewhere classified: Secondary | ICD-10-CM | POA: Diagnosis not present

## 2015-02-08 DIAGNOSIS — K591 Functional diarrhea: Secondary | ICD-10-CM | POA: Diagnosis not present

## 2015-02-08 DIAGNOSIS — M6281 Muscle weakness (generalized): Secondary | ICD-10-CM | POA: Diagnosis not present

## 2015-02-08 DIAGNOSIS — R2689 Other abnormalities of gait and mobility: Secondary | ICD-10-CM | POA: Diagnosis not present

## 2015-02-08 DIAGNOSIS — K219 Gastro-esophageal reflux disease without esophagitis: Secondary | ICD-10-CM | POA: Diagnosis not present

## 2015-02-08 DIAGNOSIS — M25561 Pain in right knee: Secondary | ICD-10-CM | POA: Diagnosis not present

## 2015-02-12 DIAGNOSIS — M25661 Stiffness of right knee, not elsewhere classified: Secondary | ICD-10-CM | POA: Diagnosis not present

## 2015-02-12 DIAGNOSIS — R2689 Other abnormalities of gait and mobility: Secondary | ICD-10-CM | POA: Diagnosis not present

## 2015-02-12 DIAGNOSIS — M6281 Muscle weakness (generalized): Secondary | ICD-10-CM | POA: Diagnosis not present

## 2015-02-12 DIAGNOSIS — M25561 Pain in right knee: Secondary | ICD-10-CM | POA: Diagnosis not present

## 2015-02-19 DIAGNOSIS — M25561 Pain in right knee: Secondary | ICD-10-CM | POA: Diagnosis not present

## 2015-02-19 DIAGNOSIS — M6281 Muscle weakness (generalized): Secondary | ICD-10-CM | POA: Diagnosis not present

## 2015-02-19 DIAGNOSIS — M25661 Stiffness of right knee, not elsewhere classified: Secondary | ICD-10-CM | POA: Diagnosis not present

## 2015-02-19 DIAGNOSIS — R2689 Other abnormalities of gait and mobility: Secondary | ICD-10-CM | POA: Diagnosis not present

## 2015-02-20 DIAGNOSIS — Z6826 Body mass index (BMI) 26.0-26.9, adult: Secondary | ICD-10-CM | POA: Diagnosis not present

## 2015-02-20 DIAGNOSIS — D509 Iron deficiency anemia, unspecified: Secondary | ICD-10-CM | POA: Diagnosis not present

## 2015-02-20 DIAGNOSIS — G250 Essential tremor: Secondary | ICD-10-CM | POA: Diagnosis not present

## 2015-02-20 DIAGNOSIS — I10 Essential (primary) hypertension: Secondary | ICD-10-CM | POA: Diagnosis not present

## 2015-02-20 DIAGNOSIS — E119 Type 2 diabetes mellitus without complications: Secondary | ICD-10-CM | POA: Diagnosis not present

## 2015-02-20 DIAGNOSIS — E039 Hypothyroidism, unspecified: Secondary | ICD-10-CM | POA: Diagnosis not present

## 2015-02-20 DIAGNOSIS — Z1389 Encounter for screening for other disorder: Secondary | ICD-10-CM | POA: Diagnosis not present

## 2015-02-20 DIAGNOSIS — E785 Hyperlipidemia, unspecified: Secondary | ICD-10-CM | POA: Diagnosis not present

## 2015-02-22 DIAGNOSIS — M6281 Muscle weakness (generalized): Secondary | ICD-10-CM | POA: Diagnosis not present

## 2015-02-22 DIAGNOSIS — Z96651 Presence of right artificial knee joint: Secondary | ICD-10-CM | POA: Diagnosis not present

## 2015-02-22 DIAGNOSIS — R2689 Other abnormalities of gait and mobility: Secondary | ICD-10-CM | POA: Diagnosis not present

## 2015-02-22 DIAGNOSIS — M25661 Stiffness of right knee, not elsewhere classified: Secondary | ICD-10-CM | POA: Diagnosis not present

## 2015-02-22 DIAGNOSIS — M25561 Pain in right knee: Secondary | ICD-10-CM | POA: Diagnosis not present

## 2015-02-27 DIAGNOSIS — M6281 Muscle weakness (generalized): Secondary | ICD-10-CM | POA: Diagnosis not present

## 2015-02-27 DIAGNOSIS — M25661 Stiffness of right knee, not elsewhere classified: Secondary | ICD-10-CM | POA: Diagnosis not present

## 2015-02-27 DIAGNOSIS — R2689 Other abnormalities of gait and mobility: Secondary | ICD-10-CM | POA: Diagnosis not present

## 2015-02-27 DIAGNOSIS — M25561 Pain in right knee: Secondary | ICD-10-CM | POA: Diagnosis not present

## 2015-03-01 DIAGNOSIS — M6281 Muscle weakness (generalized): Secondary | ICD-10-CM | POA: Diagnosis not present

## 2015-03-01 DIAGNOSIS — R2689 Other abnormalities of gait and mobility: Secondary | ICD-10-CM | POA: Diagnosis not present

## 2015-03-01 DIAGNOSIS — M25661 Stiffness of right knee, not elsewhere classified: Secondary | ICD-10-CM | POA: Diagnosis not present

## 2015-03-01 DIAGNOSIS — M25561 Pain in right knee: Secondary | ICD-10-CM | POA: Diagnosis not present

## 2015-03-06 DIAGNOSIS — M25661 Stiffness of right knee, not elsewhere classified: Secondary | ICD-10-CM | POA: Diagnosis not present

## 2015-03-06 DIAGNOSIS — R2689 Other abnormalities of gait and mobility: Secondary | ICD-10-CM | POA: Diagnosis not present

## 2015-03-06 DIAGNOSIS — M25561 Pain in right knee: Secondary | ICD-10-CM | POA: Diagnosis not present

## 2015-03-06 DIAGNOSIS — M6281 Muscle weakness (generalized): Secondary | ICD-10-CM | POA: Diagnosis not present

## 2015-03-08 DIAGNOSIS — M6281 Muscle weakness (generalized): Secondary | ICD-10-CM | POA: Diagnosis not present

## 2015-03-08 DIAGNOSIS — M25661 Stiffness of right knee, not elsewhere classified: Secondary | ICD-10-CM | POA: Diagnosis not present

## 2015-03-08 DIAGNOSIS — M25561 Pain in right knee: Secondary | ICD-10-CM | POA: Diagnosis not present

## 2015-03-08 DIAGNOSIS — R2689 Other abnormalities of gait and mobility: Secondary | ICD-10-CM | POA: Diagnosis not present

## 2015-03-12 DIAGNOSIS — H40009 Preglaucoma, unspecified, unspecified eye: Secondary | ICD-10-CM | POA: Diagnosis not present

## 2015-03-13 DIAGNOSIS — M25661 Stiffness of right knee, not elsewhere classified: Secondary | ICD-10-CM | POA: Diagnosis not present

## 2015-03-13 DIAGNOSIS — R2689 Other abnormalities of gait and mobility: Secondary | ICD-10-CM | POA: Diagnosis not present

## 2015-03-13 DIAGNOSIS — M25561 Pain in right knee: Secondary | ICD-10-CM | POA: Diagnosis not present

## 2015-03-13 DIAGNOSIS — M6281 Muscle weakness (generalized): Secondary | ICD-10-CM | POA: Diagnosis not present

## 2015-03-15 DIAGNOSIS — M6281 Muscle weakness (generalized): Secondary | ICD-10-CM | POA: Diagnosis not present

## 2015-03-15 DIAGNOSIS — M25661 Stiffness of right knee, not elsewhere classified: Secondary | ICD-10-CM | POA: Diagnosis not present

## 2015-03-15 DIAGNOSIS — M25561 Pain in right knee: Secondary | ICD-10-CM | POA: Diagnosis not present

## 2015-03-15 DIAGNOSIS — R2689 Other abnormalities of gait and mobility: Secondary | ICD-10-CM | POA: Diagnosis not present

## 2015-03-20 DIAGNOSIS — M25661 Stiffness of right knee, not elsewhere classified: Secondary | ICD-10-CM | POA: Diagnosis not present

## 2015-03-20 DIAGNOSIS — M6281 Muscle weakness (generalized): Secondary | ICD-10-CM | POA: Diagnosis not present

## 2015-03-20 DIAGNOSIS — R2689 Other abnormalities of gait and mobility: Secondary | ICD-10-CM | POA: Diagnosis not present

## 2015-03-20 DIAGNOSIS — M25561 Pain in right knee: Secondary | ICD-10-CM | POA: Diagnosis not present

## 2015-03-21 DIAGNOSIS — M25661 Stiffness of right knee, not elsewhere classified: Secondary | ICD-10-CM | POA: Diagnosis not present

## 2015-03-21 DIAGNOSIS — R2689 Other abnormalities of gait and mobility: Secondary | ICD-10-CM | POA: Diagnosis not present

## 2015-03-21 DIAGNOSIS — M25561 Pain in right knee: Secondary | ICD-10-CM | POA: Diagnosis not present

## 2015-03-21 DIAGNOSIS — M6281 Muscle weakness (generalized): Secondary | ICD-10-CM | POA: Diagnosis not present

## 2015-04-03 DIAGNOSIS — K1370 Unspecified lesions of oral mucosa: Secondary | ICD-10-CM | POA: Diagnosis not present

## 2015-04-16 DIAGNOSIS — B001 Herpesviral vesicular dermatitis: Secondary | ICD-10-CM | POA: Diagnosis not present

## 2015-04-16 DIAGNOSIS — Z6826 Body mass index (BMI) 26.0-26.9, adult: Secondary | ICD-10-CM | POA: Diagnosis not present

## 2015-05-17 DIAGNOSIS — Z96651 Presence of right artificial knee joint: Secondary | ICD-10-CM | POA: Diagnosis not present

## 2015-05-17 DIAGNOSIS — M7051 Other bursitis of knee, right knee: Secondary | ICD-10-CM | POA: Diagnosis not present

## 2015-05-17 DIAGNOSIS — M7121 Synovial cyst of popliteal space [Baker], right knee: Secondary | ICD-10-CM | POA: Diagnosis not present

## 2015-05-29 DIAGNOSIS — M7121 Synovial cyst of popliteal space [Baker], right knee: Secondary | ICD-10-CM | POA: Diagnosis not present

## 2015-06-28 DIAGNOSIS — M7121 Synovial cyst of popliteal space [Baker], right knee: Secondary | ICD-10-CM | POA: Diagnosis not present

## 2015-06-28 DIAGNOSIS — M7051 Other bursitis of knee, right knee: Secondary | ICD-10-CM | POA: Diagnosis not present

## 2015-06-28 DIAGNOSIS — Z96651 Presence of right artificial knee joint: Secondary | ICD-10-CM | POA: Diagnosis not present

## 2015-07-05 DIAGNOSIS — S76311D Strain of muscle, fascia and tendon of the posterior muscle group at thigh level, right thigh, subsequent encounter: Secondary | ICD-10-CM | POA: Diagnosis not present

## 2015-07-05 DIAGNOSIS — R2689 Other abnormalities of gait and mobility: Secondary | ICD-10-CM | POA: Diagnosis not present

## 2015-07-05 DIAGNOSIS — M25561 Pain in right knee: Secondary | ICD-10-CM | POA: Diagnosis not present

## 2015-07-18 DIAGNOSIS — R2689 Other abnormalities of gait and mobility: Secondary | ICD-10-CM | POA: Diagnosis not present

## 2015-07-18 DIAGNOSIS — S76311D Strain of muscle, fascia and tendon of the posterior muscle group at thigh level, right thigh, subsequent encounter: Secondary | ICD-10-CM | POA: Diagnosis not present

## 2015-07-18 DIAGNOSIS — M25561 Pain in right knee: Secondary | ICD-10-CM | POA: Diagnosis not present

## 2015-07-23 DIAGNOSIS — R2689 Other abnormalities of gait and mobility: Secondary | ICD-10-CM | POA: Diagnosis not present

## 2015-07-23 DIAGNOSIS — M25561 Pain in right knee: Secondary | ICD-10-CM | POA: Diagnosis not present

## 2015-07-23 DIAGNOSIS — S76311D Strain of muscle, fascia and tendon of the posterior muscle group at thigh level, right thigh, subsequent encounter: Secondary | ICD-10-CM | POA: Diagnosis not present

## 2015-07-25 DIAGNOSIS — R2689 Other abnormalities of gait and mobility: Secondary | ICD-10-CM | POA: Diagnosis not present

## 2015-07-25 DIAGNOSIS — M25561 Pain in right knee: Secondary | ICD-10-CM | POA: Diagnosis not present

## 2015-07-25 DIAGNOSIS — S76311D Strain of muscle, fascia and tendon of the posterior muscle group at thigh level, right thigh, subsequent encounter: Secondary | ICD-10-CM | POA: Diagnosis not present

## 2015-08-02 DIAGNOSIS — S76311D Strain of muscle, fascia and tendon of the posterior muscle group at thigh level, right thigh, subsequent encounter: Secondary | ICD-10-CM | POA: Diagnosis not present

## 2015-08-02 DIAGNOSIS — M25561 Pain in right knee: Secondary | ICD-10-CM | POA: Diagnosis not present

## 2015-08-02 DIAGNOSIS — R2689 Other abnormalities of gait and mobility: Secondary | ICD-10-CM | POA: Diagnosis not present

## 2015-08-08 DIAGNOSIS — S76311D Strain of muscle, fascia and tendon of the posterior muscle group at thigh level, right thigh, subsequent encounter: Secondary | ICD-10-CM | POA: Diagnosis not present

## 2015-08-08 DIAGNOSIS — M25561 Pain in right knee: Secondary | ICD-10-CM | POA: Diagnosis not present

## 2015-08-08 DIAGNOSIS — R2689 Other abnormalities of gait and mobility: Secondary | ICD-10-CM | POA: Diagnosis not present

## 2015-08-16 DIAGNOSIS — S76311D Strain of muscle, fascia and tendon of the posterior muscle group at thigh level, right thigh, subsequent encounter: Secondary | ICD-10-CM | POA: Diagnosis not present

## 2015-08-16 DIAGNOSIS — R2689 Other abnormalities of gait and mobility: Secondary | ICD-10-CM | POA: Diagnosis not present

## 2015-08-16 DIAGNOSIS — M25561 Pain in right knee: Secondary | ICD-10-CM | POA: Diagnosis not present

## 2015-08-21 DIAGNOSIS — J309 Allergic rhinitis, unspecified: Secondary | ICD-10-CM | POA: Diagnosis not present

## 2015-08-21 DIAGNOSIS — M8580 Other specified disorders of bone density and structure, unspecified site: Secondary | ICD-10-CM | POA: Diagnosis not present

## 2015-08-21 DIAGNOSIS — E119 Type 2 diabetes mellitus without complications: Secondary | ICD-10-CM | POA: Diagnosis not present

## 2015-08-21 DIAGNOSIS — M8589 Other specified disorders of bone density and structure, multiple sites: Secondary | ICD-10-CM | POA: Diagnosis not present

## 2015-08-21 DIAGNOSIS — D509 Iron deficiency anemia, unspecified: Secondary | ICD-10-CM | POA: Diagnosis not present

## 2015-08-21 DIAGNOSIS — E663 Overweight: Secondary | ICD-10-CM | POA: Diagnosis not present

## 2015-08-21 DIAGNOSIS — I10 Essential (primary) hypertension: Secondary | ICD-10-CM | POA: Diagnosis not present

## 2015-08-21 DIAGNOSIS — G250 Essential tremor: Secondary | ICD-10-CM | POA: Diagnosis not present

## 2015-08-21 DIAGNOSIS — E039 Hypothyroidism, unspecified: Secondary | ICD-10-CM | POA: Diagnosis not present

## 2015-08-21 DIAGNOSIS — Z6827 Body mass index (BMI) 27.0-27.9, adult: Secondary | ICD-10-CM | POA: Diagnosis not present

## 2015-08-21 DIAGNOSIS — E785 Hyperlipidemia, unspecified: Secondary | ICD-10-CM | POA: Diagnosis not present

## 2015-08-21 DIAGNOSIS — Z9181 History of falling: Secondary | ICD-10-CM | POA: Diagnosis not present

## 2015-08-22 DIAGNOSIS — D509 Iron deficiency anemia, unspecified: Secondary | ICD-10-CM | POA: Diagnosis not present

## 2015-08-22 DIAGNOSIS — E119 Type 2 diabetes mellitus without complications: Secondary | ICD-10-CM | POA: Diagnosis not present

## 2015-08-22 DIAGNOSIS — E039 Hypothyroidism, unspecified: Secondary | ICD-10-CM | POA: Diagnosis not present

## 2015-08-22 DIAGNOSIS — E785 Hyperlipidemia, unspecified: Secondary | ICD-10-CM | POA: Diagnosis not present

## 2015-08-29 DIAGNOSIS — M810 Age-related osteoporosis without current pathological fracture: Secondary | ICD-10-CM | POA: Diagnosis not present

## 2015-08-29 DIAGNOSIS — M8589 Other specified disorders of bone density and structure, multiple sites: Secondary | ICD-10-CM | POA: Diagnosis not present

## 2015-10-23 DIAGNOSIS — E039 Hypothyroidism, unspecified: Secondary | ICD-10-CM | POA: Diagnosis not present

## 2015-11-20 DIAGNOSIS — M810 Age-related osteoporosis without current pathological fracture: Secondary | ICD-10-CM | POA: Diagnosis not present

## 2016-01-02 DIAGNOSIS — Z23 Encounter for immunization: Secondary | ICD-10-CM | POA: Diagnosis not present

## 2016-02-18 DIAGNOSIS — M545 Low back pain: Secondary | ICD-10-CM | POA: Diagnosis not present

## 2016-02-18 DIAGNOSIS — M9902 Segmental and somatic dysfunction of thoracic region: Secondary | ICD-10-CM | POA: Diagnosis not present

## 2016-02-18 DIAGNOSIS — M9905 Segmental and somatic dysfunction of pelvic region: Secondary | ICD-10-CM | POA: Diagnosis not present

## 2016-02-18 DIAGNOSIS — M9903 Segmental and somatic dysfunction of lumbar region: Secondary | ICD-10-CM | POA: Diagnosis not present

## 2016-02-26 DIAGNOSIS — M4726 Other spondylosis with radiculopathy, lumbar region: Secondary | ICD-10-CM | POA: Diagnosis not present

## 2016-02-26 DIAGNOSIS — M810 Age-related osteoporosis without current pathological fracture: Secondary | ICD-10-CM | POA: Diagnosis not present

## 2016-02-26 DIAGNOSIS — E785 Hyperlipidemia, unspecified: Secondary | ICD-10-CM | POA: Diagnosis not present

## 2016-02-26 DIAGNOSIS — Z6827 Body mass index (BMI) 27.0-27.9, adult: Secondary | ICD-10-CM | POA: Diagnosis not present

## 2016-02-26 DIAGNOSIS — K219 Gastro-esophageal reflux disease without esophagitis: Secondary | ICD-10-CM | POA: Diagnosis not present

## 2016-02-26 DIAGNOSIS — E039 Hypothyroidism, unspecified: Secondary | ICD-10-CM | POA: Diagnosis not present

## 2016-02-26 DIAGNOSIS — D509 Iron deficiency anemia, unspecified: Secondary | ICD-10-CM | POA: Diagnosis not present

## 2016-02-26 DIAGNOSIS — E119 Type 2 diabetes mellitus without complications: Secondary | ICD-10-CM | POA: Diagnosis not present

## 2016-02-26 DIAGNOSIS — I10 Essential (primary) hypertension: Secondary | ICD-10-CM | POA: Diagnosis not present

## 2016-02-29 DIAGNOSIS — M4726 Other spondylosis with radiculopathy, lumbar region: Secondary | ICD-10-CM | POA: Diagnosis not present

## 2016-02-29 DIAGNOSIS — M545 Low back pain: Secondary | ICD-10-CM | POA: Diagnosis not present

## 2016-03-03 DIAGNOSIS — Z6827 Body mass index (BMI) 27.0-27.9, adult: Secondary | ICD-10-CM | POA: Diagnosis not present

## 2016-03-03 DIAGNOSIS — R600 Localized edema: Secondary | ICD-10-CM | POA: Diagnosis not present

## 2016-03-04 DIAGNOSIS — R600 Localized edema: Secondary | ICD-10-CM | POA: Diagnosis not present

## 2016-03-04 DIAGNOSIS — M7989 Other specified soft tissue disorders: Secondary | ICD-10-CM | POA: Diagnosis not present

## 2016-03-18 DIAGNOSIS — Z1231 Encounter for screening mammogram for malignant neoplasm of breast: Secondary | ICD-10-CM | POA: Diagnosis not present

## 2016-03-18 DIAGNOSIS — Z1389 Encounter for screening for other disorder: Secondary | ICD-10-CM | POA: Diagnosis not present

## 2016-03-18 DIAGNOSIS — Z136 Encounter for screening for cardiovascular disorders: Secondary | ICD-10-CM | POA: Diagnosis not present

## 2016-03-18 DIAGNOSIS — Z9181 History of falling: Secondary | ICD-10-CM | POA: Diagnosis not present

## 2016-03-18 DIAGNOSIS — N959 Unspecified menopausal and perimenopausal disorder: Secondary | ICD-10-CM | POA: Diagnosis not present

## 2016-03-18 DIAGNOSIS — Z0000 Encounter for general adult medical examination without abnormal findings: Secondary | ICD-10-CM | POA: Diagnosis not present

## 2016-03-18 DIAGNOSIS — Z23 Encounter for immunization: Secondary | ICD-10-CM | POA: Diagnosis not present

## 2016-04-07 DIAGNOSIS — M7051 Other bursitis of knee, right knee: Secondary | ICD-10-CM | POA: Diagnosis not present

## 2016-05-06 DIAGNOSIS — H04123 Dry eye syndrome of bilateral lacrimal glands: Secondary | ICD-10-CM | POA: Diagnosis not present

## 2016-05-06 DIAGNOSIS — H25813 Combined forms of age-related cataract, bilateral: Secondary | ICD-10-CM | POA: Diagnosis not present

## 2016-05-06 DIAGNOSIS — H524 Presbyopia: Secondary | ICD-10-CM | POA: Diagnosis not present

## 2016-05-20 DIAGNOSIS — M810 Age-related osteoporosis without current pathological fracture: Secondary | ICD-10-CM | POA: Diagnosis not present

## 2016-06-10 DIAGNOSIS — E039 Hypothyroidism, unspecified: Secondary | ICD-10-CM | POA: Diagnosis not present

## 2016-06-10 DIAGNOSIS — Z6827 Body mass index (BMI) 27.0-27.9, adult: Secondary | ICD-10-CM | POA: Diagnosis not present

## 2016-06-10 DIAGNOSIS — M419 Scoliosis, unspecified: Secondary | ICD-10-CM | POA: Diagnosis not present

## 2016-07-10 DIAGNOSIS — E039 Hypothyroidism, unspecified: Secondary | ICD-10-CM | POA: Diagnosis not present

## 2016-07-15 DIAGNOSIS — K219 Gastro-esophageal reflux disease without esophagitis: Secondary | ICD-10-CM | POA: Diagnosis not present

## 2016-07-15 DIAGNOSIS — R1310 Dysphagia, unspecified: Secondary | ICD-10-CM | POA: Diagnosis not present

## 2016-07-30 DIAGNOSIS — K2970 Gastritis, unspecified, without bleeding: Secondary | ICD-10-CM | POA: Diagnosis not present

## 2016-07-30 DIAGNOSIS — Q393 Congenital stenosis and stricture of esophagus: Secondary | ICD-10-CM | POA: Diagnosis not present

## 2016-07-30 DIAGNOSIS — K219 Gastro-esophageal reflux disease without esophagitis: Secondary | ICD-10-CM | POA: Diagnosis not present

## 2016-07-30 DIAGNOSIS — D509 Iron deficiency anemia, unspecified: Secondary | ICD-10-CM | POA: Diagnosis not present

## 2016-07-30 DIAGNOSIS — E039 Hypothyroidism, unspecified: Secondary | ICD-10-CM | POA: Diagnosis not present

## 2016-07-30 DIAGNOSIS — Z79899 Other long term (current) drug therapy: Secondary | ICD-10-CM | POA: Diagnosis not present

## 2016-07-30 DIAGNOSIS — K449 Diaphragmatic hernia without obstruction or gangrene: Secondary | ICD-10-CM | POA: Diagnosis not present

## 2016-07-30 DIAGNOSIS — R1310 Dysphagia, unspecified: Secondary | ICD-10-CM | POA: Diagnosis not present

## 2016-07-30 DIAGNOSIS — K222 Esophageal obstruction: Secondary | ICD-10-CM | POA: Diagnosis not present

## 2016-07-30 DIAGNOSIS — K228 Other specified diseases of esophagus: Secondary | ICD-10-CM | POA: Diagnosis not present

## 2016-07-30 DIAGNOSIS — I10 Essential (primary) hypertension: Secondary | ICD-10-CM | POA: Diagnosis not present

## 2016-07-30 HISTORY — PX: ESOPHAGOGASTRODUODENOSCOPY: SHX1529

## 2016-08-19 DIAGNOSIS — E039 Hypothyroidism, unspecified: Secondary | ICD-10-CM | POA: Diagnosis not present

## 2016-08-27 DIAGNOSIS — M810 Age-related osteoporosis without current pathological fracture: Secondary | ICD-10-CM | POA: Diagnosis not present

## 2016-08-27 DIAGNOSIS — E119 Type 2 diabetes mellitus without complications: Secondary | ICD-10-CM | POA: Diagnosis not present

## 2016-08-27 DIAGNOSIS — E785 Hyperlipidemia, unspecified: Secondary | ICD-10-CM | POA: Diagnosis not present

## 2016-08-27 DIAGNOSIS — D509 Iron deficiency anemia, unspecified: Secondary | ICD-10-CM | POA: Diagnosis not present

## 2016-08-27 DIAGNOSIS — Z6827 Body mass index (BMI) 27.0-27.9, adult: Secondary | ICD-10-CM | POA: Diagnosis not present

## 2016-08-27 DIAGNOSIS — I10 Essential (primary) hypertension: Secondary | ICD-10-CM | POA: Diagnosis not present

## 2016-08-27 DIAGNOSIS — E039 Hypothyroidism, unspecified: Secondary | ICD-10-CM | POA: Diagnosis not present

## 2016-09-25 DIAGNOSIS — E039 Hypothyroidism, unspecified: Secondary | ICD-10-CM | POA: Diagnosis not present

## 2016-10-04 DIAGNOSIS — J309 Allergic rhinitis, unspecified: Secondary | ICD-10-CM | POA: Diagnosis not present

## 2016-10-04 DIAGNOSIS — J069 Acute upper respiratory infection, unspecified: Secondary | ICD-10-CM | POA: Diagnosis not present

## 2016-11-26 DIAGNOSIS — M810 Age-related osteoporosis without current pathological fracture: Secondary | ICD-10-CM | POA: Diagnosis not present

## 2017-03-04 DIAGNOSIS — D509 Iron deficiency anemia, unspecified: Secondary | ICD-10-CM | POA: Diagnosis not present

## 2017-03-04 DIAGNOSIS — Z6826 Body mass index (BMI) 26.0-26.9, adult: Secondary | ICD-10-CM | POA: Diagnosis not present

## 2017-03-04 DIAGNOSIS — I10 Essential (primary) hypertension: Secondary | ICD-10-CM | POA: Diagnosis not present

## 2017-03-04 DIAGNOSIS — E785 Hyperlipidemia, unspecified: Secondary | ICD-10-CM | POA: Diagnosis not present

## 2017-03-04 DIAGNOSIS — E039 Hypothyroidism, unspecified: Secondary | ICD-10-CM | POA: Diagnosis not present

## 2017-03-04 DIAGNOSIS — M810 Age-related osteoporosis without current pathological fracture: Secondary | ICD-10-CM | POA: Diagnosis not present

## 2017-03-04 DIAGNOSIS — E119 Type 2 diabetes mellitus without complications: Secondary | ICD-10-CM | POA: Diagnosis not present

## 2017-03-18 DIAGNOSIS — E785 Hyperlipidemia, unspecified: Secondary | ICD-10-CM | POA: Diagnosis not present

## 2017-03-18 DIAGNOSIS — Z1231 Encounter for screening mammogram for malignant neoplasm of breast: Secondary | ICD-10-CM | POA: Diagnosis not present

## 2017-03-18 DIAGNOSIS — N959 Unspecified menopausal and perimenopausal disorder: Secondary | ICD-10-CM | POA: Diagnosis not present

## 2017-03-18 DIAGNOSIS — Z9181 History of falling: Secondary | ICD-10-CM | POA: Diagnosis not present

## 2017-03-18 DIAGNOSIS — Z0000 Encounter for general adult medical examination without abnormal findings: Secondary | ICD-10-CM | POA: Diagnosis not present

## 2017-03-18 DIAGNOSIS — Z1331 Encounter for screening for depression: Secondary | ICD-10-CM | POA: Diagnosis not present

## 2017-03-18 DIAGNOSIS — Z136 Encounter for screening for cardiovascular disorders: Secondary | ICD-10-CM | POA: Diagnosis not present

## 2017-04-20 DIAGNOSIS — H40013 Open angle with borderline findings, low risk, bilateral: Secondary | ICD-10-CM | POA: Diagnosis not present

## 2017-04-20 DIAGNOSIS — E119 Type 2 diabetes mellitus without complications: Secondary | ICD-10-CM | POA: Diagnosis not present

## 2017-04-20 DIAGNOSIS — H25813 Combined forms of age-related cataract, bilateral: Secondary | ICD-10-CM | POA: Diagnosis not present

## 2017-05-01 ENCOUNTER — Encounter (HOSPITAL_COMMUNITY): Payer: Self-pay | Admitting: Certified Registered Nurse Anesthetist

## 2017-05-01 ENCOUNTER — Ambulatory Visit: Admit: 2017-05-01 | Payer: Self-pay | Admitting: Cardiovascular Disease

## 2017-05-01 SURGERY — CARDIOVERSION
Anesthesia: Choice

## 2017-06-08 DIAGNOSIS — M810 Age-related osteoporosis without current pathological fracture: Secondary | ICD-10-CM | POA: Diagnosis not present

## 2017-06-26 DIAGNOSIS — M272 Inflammatory conditions of jaws: Secondary | ICD-10-CM | POA: Diagnosis not present

## 2017-06-26 DIAGNOSIS — M898X8 Other specified disorders of bone, other site: Secondary | ICD-10-CM | POA: Diagnosis not present

## 2017-06-26 DIAGNOSIS — K121 Other forms of stomatitis: Secondary | ICD-10-CM | POA: Diagnosis not present

## 2017-09-02 DIAGNOSIS — I10 Essential (primary) hypertension: Secondary | ICD-10-CM | POA: Diagnosis not present

## 2017-09-02 DIAGNOSIS — E119 Type 2 diabetes mellitus without complications: Secondary | ICD-10-CM | POA: Diagnosis not present

## 2017-09-02 DIAGNOSIS — E785 Hyperlipidemia, unspecified: Secondary | ICD-10-CM | POA: Diagnosis not present

## 2017-09-02 DIAGNOSIS — E039 Hypothyroidism, unspecified: Secondary | ICD-10-CM | POA: Diagnosis not present

## 2017-09-02 DIAGNOSIS — D509 Iron deficiency anemia, unspecified: Secondary | ICD-10-CM | POA: Diagnosis not present

## 2017-12-14 DIAGNOSIS — M810 Age-related osteoporosis without current pathological fracture: Secondary | ICD-10-CM | POA: Diagnosis not present

## 2017-12-16 DIAGNOSIS — M810 Age-related osteoporosis without current pathological fracture: Secondary | ICD-10-CM | POA: Diagnosis not present

## 2017-12-16 DIAGNOSIS — Z23 Encounter for immunization: Secondary | ICD-10-CM | POA: Diagnosis not present

## 2018-03-10 DIAGNOSIS — K122 Cellulitis and abscess of mouth: Secondary | ICD-10-CM | POA: Diagnosis not present

## 2018-03-11 DIAGNOSIS — E119 Type 2 diabetes mellitus without complications: Secondary | ICD-10-CM | POA: Diagnosis not present

## 2018-03-11 DIAGNOSIS — D509 Iron deficiency anemia, unspecified: Secondary | ICD-10-CM | POA: Diagnosis not present

## 2018-03-11 DIAGNOSIS — E785 Hyperlipidemia, unspecified: Secondary | ICD-10-CM | POA: Diagnosis not present

## 2018-03-11 DIAGNOSIS — I10 Essential (primary) hypertension: Secondary | ICD-10-CM | POA: Diagnosis not present

## 2018-03-11 DIAGNOSIS — E039 Hypothyroidism, unspecified: Secondary | ICD-10-CM | POA: Diagnosis not present

## 2018-03-18 DIAGNOSIS — M419 Scoliosis, unspecified: Secondary | ICD-10-CM | POA: Diagnosis not present

## 2018-03-18 DIAGNOSIS — M4156 Other secondary scoliosis, lumbar region: Secondary | ICD-10-CM | POA: Diagnosis not present

## 2018-03-18 DIAGNOSIS — M549 Dorsalgia, unspecified: Secondary | ICD-10-CM | POA: Diagnosis not present

## 2018-03-23 DIAGNOSIS — Z9181 History of falling: Secondary | ICD-10-CM | POA: Diagnosis not present

## 2018-03-23 DIAGNOSIS — Z136 Encounter for screening for cardiovascular disorders: Secondary | ICD-10-CM | POA: Diagnosis not present

## 2018-03-23 DIAGNOSIS — Z0000 Encounter for general adult medical examination without abnormal findings: Secondary | ICD-10-CM | POA: Diagnosis not present

## 2018-03-23 DIAGNOSIS — E785 Hyperlipidemia, unspecified: Secondary | ICD-10-CM | POA: Diagnosis not present

## 2018-03-23 DIAGNOSIS — Z1331 Encounter for screening for depression: Secondary | ICD-10-CM | POA: Diagnosis not present

## 2018-03-23 DIAGNOSIS — Z1231 Encounter for screening mammogram for malignant neoplasm of breast: Secondary | ICD-10-CM | POA: Diagnosis not present

## 2018-05-28 DIAGNOSIS — E119 Type 2 diabetes mellitus without complications: Secondary | ICD-10-CM | POA: Diagnosis not present

## 2018-05-28 DIAGNOSIS — H40013 Open angle with borderline findings, low risk, bilateral: Secondary | ICD-10-CM | POA: Diagnosis not present

## 2018-05-28 DIAGNOSIS — H25813 Combined forms of age-related cataract, bilateral: Secondary | ICD-10-CM | POA: Diagnosis not present

## 2018-06-17 DIAGNOSIS — M419 Scoliosis, unspecified: Secondary | ICD-10-CM | POA: Diagnosis not present

## 2018-06-17 DIAGNOSIS — Z6824 Body mass index (BMI) 24.0-24.9, adult: Secondary | ICD-10-CM | POA: Diagnosis not present

## 2018-06-17 DIAGNOSIS — M4156 Other secondary scoliosis, lumbar region: Secondary | ICD-10-CM | POA: Diagnosis not present

## 2018-08-11 DIAGNOSIS — Z1231 Encounter for screening mammogram for malignant neoplasm of breast: Secondary | ICD-10-CM | POA: Diagnosis not present

## 2018-09-10 DIAGNOSIS — E785 Hyperlipidemia, unspecified: Secondary | ICD-10-CM | POA: Diagnosis not present

## 2018-09-10 DIAGNOSIS — I10 Essential (primary) hypertension: Secondary | ICD-10-CM | POA: Diagnosis not present

## 2018-09-10 DIAGNOSIS — E1169 Type 2 diabetes mellitus with other specified complication: Secondary | ICD-10-CM | POA: Diagnosis not present

## 2018-09-10 DIAGNOSIS — E039 Hypothyroidism, unspecified: Secondary | ICD-10-CM | POA: Diagnosis not present

## 2018-09-10 DIAGNOSIS — Z139 Encounter for screening, unspecified: Secondary | ICD-10-CM | POA: Diagnosis not present

## 2018-09-10 DIAGNOSIS — D509 Iron deficiency anemia, unspecified: Secondary | ICD-10-CM | POA: Diagnosis not present

## 2018-09-30 DIAGNOSIS — M419 Scoliosis, unspecified: Secondary | ICD-10-CM | POA: Diagnosis not present

## 2018-09-30 DIAGNOSIS — M4156 Other secondary scoliosis, lumbar region: Secondary | ICD-10-CM | POA: Diagnosis not present

## 2018-10-14 DIAGNOSIS — Z23 Encounter for immunization: Secondary | ICD-10-CM | POA: Diagnosis not present

## 2018-11-22 ENCOUNTER — Encounter: Payer: Self-pay | Admitting: Gastroenterology

## 2018-11-30 ENCOUNTER — Telehealth: Payer: Self-pay

## 2018-11-30 NOTE — Telephone Encounter (Signed)
Called patient and lvm to call back.  Patient ov for 11/30 needs to be changed to virtual and phone numbers need to be checked to see if they are correct

## 2018-12-06 ENCOUNTER — Ambulatory Visit: Payer: Self-pay | Admitting: Gastroenterology

## 2019-03-10 DIAGNOSIS — D509 Iron deficiency anemia, unspecified: Secondary | ICD-10-CM | POA: Diagnosis not present

## 2019-03-10 DIAGNOSIS — E039 Hypothyroidism, unspecified: Secondary | ICD-10-CM | POA: Diagnosis not present

## 2019-03-10 DIAGNOSIS — E1169 Type 2 diabetes mellitus with other specified complication: Secondary | ICD-10-CM | POA: Diagnosis not present

## 2019-03-10 DIAGNOSIS — I10 Essential (primary) hypertension: Secondary | ICD-10-CM | POA: Diagnosis not present

## 2019-03-10 DIAGNOSIS — E785 Hyperlipidemia, unspecified: Secondary | ICD-10-CM | POA: Diagnosis not present

## 2019-04-12 DIAGNOSIS — E039 Hypothyroidism, unspecified: Secondary | ICD-10-CM | POA: Diagnosis not present

## 2019-04-21 DIAGNOSIS — M4156 Other secondary scoliosis, lumbar region: Secondary | ICD-10-CM | POA: Diagnosis not present

## 2019-04-21 DIAGNOSIS — M419 Scoliosis, unspecified: Secondary | ICD-10-CM | POA: Diagnosis not present

## 2019-05-16 DIAGNOSIS — E039 Hypothyroidism, unspecified: Secondary | ICD-10-CM | POA: Diagnosis not present

## 2019-09-13 DIAGNOSIS — E039 Hypothyroidism, unspecified: Secondary | ICD-10-CM | POA: Diagnosis not present

## 2019-09-13 DIAGNOSIS — M810 Age-related osteoporosis without current pathological fracture: Secondary | ICD-10-CM | POA: Diagnosis not present

## 2019-09-13 DIAGNOSIS — I10 Essential (primary) hypertension: Secondary | ICD-10-CM | POA: Diagnosis not present

## 2019-09-13 DIAGNOSIS — Z9181 History of falling: Secondary | ICD-10-CM | POA: Diagnosis not present

## 2019-09-13 DIAGNOSIS — Z79899 Other long term (current) drug therapy: Secondary | ICD-10-CM | POA: Diagnosis not present

## 2019-09-13 DIAGNOSIS — E1169 Type 2 diabetes mellitus with other specified complication: Secondary | ICD-10-CM | POA: Diagnosis not present

## 2019-09-13 DIAGNOSIS — D509 Iron deficiency anemia, unspecified: Secondary | ICD-10-CM | POA: Diagnosis not present

## 2019-09-13 DIAGNOSIS — G2581 Restless legs syndrome: Secondary | ICD-10-CM | POA: Diagnosis not present

## 2019-09-13 DIAGNOSIS — E785 Hyperlipidemia, unspecified: Secondary | ICD-10-CM | POA: Diagnosis not present

## 2019-10-05 DIAGNOSIS — Z139 Encounter for screening, unspecified: Secondary | ICD-10-CM | POA: Diagnosis not present

## 2019-10-05 DIAGNOSIS — Z0000 Encounter for general adult medical examination without abnormal findings: Secondary | ICD-10-CM | POA: Diagnosis not present

## 2019-10-05 DIAGNOSIS — Z9181 History of falling: Secondary | ICD-10-CM | POA: Diagnosis not present

## 2019-10-05 DIAGNOSIS — E785 Hyperlipidemia, unspecified: Secondary | ICD-10-CM | POA: Diagnosis not present

## 2019-10-05 DIAGNOSIS — Z1331 Encounter for screening for depression: Secondary | ICD-10-CM | POA: Diagnosis not present

## 2019-10-07 DIAGNOSIS — Z23 Encounter for immunization: Secondary | ICD-10-CM | POA: Diagnosis not present

## 2019-10-17 DIAGNOSIS — E039 Hypothyroidism, unspecified: Secondary | ICD-10-CM | POA: Diagnosis not present

## 2019-10-20 DIAGNOSIS — M419 Scoliosis, unspecified: Secondary | ICD-10-CM | POA: Diagnosis not present

## 2019-10-20 DIAGNOSIS — M4156 Other secondary scoliosis, lumbar region: Secondary | ICD-10-CM | POA: Diagnosis not present

## 2019-11-15 DIAGNOSIS — H25813 Combined forms of age-related cataract, bilateral: Secondary | ICD-10-CM | POA: Diagnosis not present

## 2019-11-15 DIAGNOSIS — H18413 Arcus senilis, bilateral: Secondary | ICD-10-CM | POA: Diagnosis not present

## 2019-11-15 DIAGNOSIS — H25043 Posterior subcapsular polar age-related cataract, bilateral: Secondary | ICD-10-CM | POA: Diagnosis not present

## 2019-11-15 DIAGNOSIS — H02831 Dermatochalasis of right upper eyelid: Secondary | ICD-10-CM | POA: Diagnosis not present

## 2019-11-28 DIAGNOSIS — E039 Hypothyroidism, unspecified: Secondary | ICD-10-CM | POA: Diagnosis not present

## 2020-03-16 DIAGNOSIS — G4700 Insomnia, unspecified: Secondary | ICD-10-CM | POA: Diagnosis not present

## 2020-03-16 DIAGNOSIS — E785 Hyperlipidemia, unspecified: Secondary | ICD-10-CM | POA: Diagnosis not present

## 2020-03-16 DIAGNOSIS — M810 Age-related osteoporosis without current pathological fracture: Secondary | ICD-10-CM | POA: Diagnosis not present

## 2020-03-16 DIAGNOSIS — I10 Essential (primary) hypertension: Secondary | ICD-10-CM | POA: Diagnosis not present

## 2020-03-16 DIAGNOSIS — D509 Iron deficiency anemia, unspecified: Secondary | ICD-10-CM | POA: Diagnosis not present

## 2020-03-16 DIAGNOSIS — E1169 Type 2 diabetes mellitus with other specified complication: Secondary | ICD-10-CM | POA: Diagnosis not present

## 2020-03-16 DIAGNOSIS — E039 Hypothyroidism, unspecified: Secondary | ICD-10-CM | POA: Diagnosis not present

## 2020-06-12 DIAGNOSIS — N390 Urinary tract infection, site not specified: Secondary | ICD-10-CM | POA: Diagnosis not present

## 2020-09-05 ENCOUNTER — Encounter: Payer: Self-pay | Admitting: Gastroenterology

## 2020-09-17 DIAGNOSIS — M810 Age-related osteoporosis without current pathological fracture: Secondary | ICD-10-CM | POA: Diagnosis not present

## 2020-09-17 DIAGNOSIS — E1169 Type 2 diabetes mellitus with other specified complication: Secondary | ICD-10-CM | POA: Diagnosis not present

## 2020-09-17 DIAGNOSIS — I10 Essential (primary) hypertension: Secondary | ICD-10-CM | POA: Diagnosis not present

## 2020-09-17 DIAGNOSIS — D509 Iron deficiency anemia, unspecified: Secondary | ICD-10-CM | POA: Diagnosis not present

## 2020-09-17 DIAGNOSIS — E039 Hypothyroidism, unspecified: Secondary | ICD-10-CM | POA: Diagnosis not present

## 2020-09-17 DIAGNOSIS — G4700 Insomnia, unspecified: Secondary | ICD-10-CM | POA: Diagnosis not present

## 2020-09-17 DIAGNOSIS — F419 Anxiety disorder, unspecified: Secondary | ICD-10-CM | POA: Diagnosis not present

## 2020-09-17 DIAGNOSIS — E785 Hyperlipidemia, unspecified: Secondary | ICD-10-CM | POA: Diagnosis not present

## 2020-09-17 DIAGNOSIS — Z79899 Other long term (current) drug therapy: Secondary | ICD-10-CM | POA: Diagnosis not present

## 2020-09-17 DIAGNOSIS — G2581 Restless legs syndrome: Secondary | ICD-10-CM | POA: Diagnosis not present

## 2020-10-08 DIAGNOSIS — E785 Hyperlipidemia, unspecified: Secondary | ICD-10-CM | POA: Diagnosis not present

## 2020-10-08 DIAGNOSIS — Z139 Encounter for screening, unspecified: Secondary | ICD-10-CM | POA: Diagnosis not present

## 2020-10-08 DIAGNOSIS — Z1331 Encounter for screening for depression: Secondary | ICD-10-CM | POA: Diagnosis not present

## 2020-10-08 DIAGNOSIS — Z9181 History of falling: Secondary | ICD-10-CM | POA: Diagnosis not present

## 2020-10-08 DIAGNOSIS — Z0000 Encounter for general adult medical examination without abnormal findings: Secondary | ICD-10-CM | POA: Diagnosis not present

## 2020-10-18 DIAGNOSIS — M419 Scoliosis, unspecified: Secondary | ICD-10-CM | POA: Diagnosis not present

## 2020-10-18 DIAGNOSIS — M4156 Other secondary scoliosis, lumbar region: Secondary | ICD-10-CM | POA: Diagnosis not present

## 2020-10-19 DIAGNOSIS — E039 Hypothyroidism, unspecified: Secondary | ICD-10-CM | POA: Diagnosis not present

## 2020-10-24 DIAGNOSIS — E039 Hypothyroidism, unspecified: Secondary | ICD-10-CM | POA: Diagnosis not present

## 2020-11-05 ENCOUNTER — Ambulatory Visit: Payer: Medicare Other | Admitting: Gastroenterology

## 2020-11-22 ENCOUNTER — Encounter: Payer: Self-pay | Admitting: Nurse Practitioner

## 2020-11-22 ENCOUNTER — Ambulatory Visit (INDEPENDENT_AMBULATORY_CARE_PROVIDER_SITE_OTHER): Payer: Medicare Other | Admitting: Nurse Practitioner

## 2020-11-22 VITALS — BP 104/60 | HR 54 | Ht 63.0 in | Wt 125.2 lb

## 2020-11-22 DIAGNOSIS — R1310 Dysphagia, unspecified: Secondary | ICD-10-CM | POA: Diagnosis not present

## 2020-11-22 DIAGNOSIS — K219 Gastro-esophageal reflux disease without esophagitis: Secondary | ICD-10-CM | POA: Diagnosis not present

## 2020-11-22 NOTE — Patient Instructions (Signed)
If you are age 85 or older, your body mass index should be between 23-30. Your Body mass index is 22.19 kg/m. If this is out of the aforementioned range listed, please consider follow up with your Primary Care Provider.  The [place] [facility] providers would like to encourage you to use [facility] to communicate with providers for non-urgent requests or questions.  Due to long hold times on the telephone, sending your provider a message by [facility] may be faster and more efficient way to get a response. Please allow 48 business hours for a response.  Please remember that this is for non-urgent requests/questions.  PROCEDURES: You have been scheduled for a EGD. Please follow the written instructions given to you at your visit today. If you use inhalers (even only as needed), please bring them with you on the day of your procedure.  It was great seeing you today! Thank you for entrusting me with your care and choosing [facility].  Tye Savoy, NP

## 2020-11-22 NOTE — Progress Notes (Signed)
Agree with assessment/plan RG 

## 2020-11-22 NOTE — Progress Notes (Addendum)
ASSESSMENT AND PLAN    # 87 year old female with intermittent solid food dysphagia and breakthrough reflux symptoms on PPI.  Previously followed by Dr. Lyndel Safe in [place].  She has a known history of a large hiatal hernia, Schatzki's and a highly tortuous esophagus in 2018. Recurrent dysphagia probably multifactorial --Anti-reflux measures discussed including avoidance of trigger foods and evening meals / bedtime snacks. If able, elevate head of bed 6-8 inches. If unable to elevate the head of the bed consider a wedge pillow --Schedule for EGD for possible dilation.The risks and benefits of EGD with possible biopsies were discussed with the patient who agrees to proceed. Her advanced age increases risk of procedures but she seems to be in relatively good health.  --Continue daily PPI and Mylanta as needed for now. At some point we may add famotidine at bedtime to help better control nocturnal symptoms.    HISTORY OF PRESENT ILLNESS     Chief Complaint : heartburn and recurrent problems swallowing.   Cheryl Stafford is a 87 y.o. female with a past medical history significant for DM,  hypothyroidism, GERD, hiatal hernia, Schatzki's ring, diverticulosis, IBS, arthritis, hysterectomy.  See PMH below for any additional history.   Patient previously followed by Dr. Lyndel Safe when he was in [place]. Patient tells me Dr. Lyndel Safe has stretched her esophagus twice. I do not have access to those records but she per [facility] she had an EGD 2018>>> highly tortuous esophagus suggestive of motility disorder / presbyesophagus. Schatzki's ring. Large hiatal hernia  Over the last few months patient has begun having recurrent dysphagia to solids and also medications. Bread and meat are main culprits. No problems swallowing liquid. She is also having breakthrough pyrosis, mainly postprandial. She has nocturnal regurgitation of "hot acid". Sleeps on several pillow. She takes daily protonix and taking Mylanta as needed which  helps.   Received some records / labs from Dr. Nelda Bucks with [facility].  September -October 2022 TSH 23 CBC normal.  Not very legible but hemoglobin is 12. 1 Creatinine 0.71 Liver chemistries normal TIBC 308 Iron percent saturation 31%    Past Medical History:  Diagnosis Date   Esophageal dysphagia    Functional diarrhea    GERD (gastroesophageal reflux disease)    Hypertension    Hypothyroid    IDA (iron deficiency anemia)     Past Surgical History:  Procedure Laterality Date   ABDOMINAL HYSTERECTOMY     COLONOSCOPY  06/07/2012   Colonic polyp status post polypectomy, Pancolonic diverticulosis redominantly in the sigmoid colon. Examination was limited due to qualitiy of preparation.   ESOPHAGOGASTRODUODENOSCOPY  07/30/2016   Highly torturous esophagus suggestive of motility disorder/presbyesophagus. Schatzki's ring status post esophageal dilatation. Large hiatal hernia. Mild gastritis.    TOTAL KNEE ARTHROPLASTY  01/02/2015   Family History  Problem Relation Age of Onset   Colon cancer Neg Hx    Stomach cancer Neg Hx    Social History   Tobacco Use   Smoking status: Never   Smokeless tobacco: Never  Vaping Use   Vaping Use: Never used  Substance Use Topics   Alcohol use: Never   Drug use: Never   Current Outpatient Medications  Medication Sig Dispense Refill   amLODipine-valsartan (EXFORGE) 5-160 MG tablet Take 1 tablet by mouth daily.     escitalopram (LEXAPRO) 20 MG tablet Take 20 mg by mouth daily.     gabapentin (NEURONTIN) 300 MG capsule Take 300 mg by mouth daily.  levothyroxine (SYNTHROID) 137 MCG tablet Take 137 mcg by mouth daily.     LORazepam (ATIVAN) 1 MG tablet Take 1 mg by mouth 3 (three) times daily.     pantoprazole (PROTONIX) 40 MG tablet 40 mg daily.     pramipexole (MIRAPEX) 0.25 MG tablet Take 0.25 mg by mouth at bedtime.     propranolol (INDERAL) 10 MG tablet Take 10 mg by mouth 2 (two) times daily.      rOPINIRole (REQUIP) 0.5 MG tablet 0.5 mg.     zolpidem (AMBIEN) 10 MG tablet 10 mg at bedtime as needed.     No current facility-administered medications for this visit.   Allergies  Allergen Reactions   Codeine Nausea Only     Review of Systems: Positive for arthritis, back pain, muscle pain and cramps, swelling of feet and legs. All other systems reviewed and negative except where noted in HPI.    PHYSICAL EXAM :    Wt Readings from Last 3 Encounters:  11/22/20 125 lb 4 oz (56.8 kg)    BP 104/60   Pulse (!) 54   Ht 5\' 3"  (1.6 m)   Wt 125 lb 4 oz (56.8 kg)   BMI 22.19 kg/m  Constitutional:  Generally well appearing female in no acute distress. Psychiatric: Pleasant. Normal mood and affect. Behavior is normal. EENT: Pupils normal.  Conjunctivae are normal. No scleral icterus. Neck supple.  Cardiovascular: Normal rate, regular rhythm. No edema Pulmonary/chest: Effort normal and breath sounds normal. No wheezing, rales or rhonchi. Abdominal: Soft, nondistended, nontender. Bowel sounds active throughout. There are no masses palpable. No hepatomegaly. Neurological: Alert and oriented to person place and time. Skin: Skin is warm and dry. No rashes noted.  Tye Savoy, NP  11/22/2020, 11:43 AM

## 2020-11-26 DIAGNOSIS — E039 Hypothyroidism, unspecified: Secondary | ICD-10-CM | POA: Diagnosis not present

## 2020-12-07 ENCOUNTER — Ambulatory Visit: Payer: Medicare Other | Admitting: Gastroenterology

## 2020-12-11 ENCOUNTER — Emergency Department (HOSPITAL_COMMUNITY): Payer: Medicare Other

## 2020-12-11 ENCOUNTER — Other Ambulatory Visit: Payer: Self-pay

## 2020-12-11 ENCOUNTER — Inpatient Hospital Stay (HOSPITAL_COMMUNITY)
Admission: EM | Admit: 2020-12-11 | Discharge: 2020-12-18 | DRG: 086 | Disposition: A | Discharge status: Rehabilitation Facility | Payer: Medicare Other | Attending: Internal Medicine | Admitting: Internal Medicine

## 2020-12-11 DIAGNOSIS — Z885 Allergy status to narcotic agent status: Secondary | ICD-10-CM | POA: Diagnosis not present

## 2020-12-11 DIAGNOSIS — S0990XA Unspecified injury of head, initial encounter: Secondary | ICD-10-CM | POA: Diagnosis not present

## 2020-12-11 DIAGNOSIS — K5901 Slow transit constipation: Secondary | ICD-10-CM | POA: Diagnosis not present

## 2020-12-11 DIAGNOSIS — W1830XA Fall on same level, unspecified, initial encounter: Secondary | ICD-10-CM | POA: Diagnosis present

## 2020-12-11 DIAGNOSIS — I340 Nonrheumatic mitral (valve) insufficiency: Secondary | ICD-10-CM | POA: Diagnosis not present

## 2020-12-11 DIAGNOSIS — Z79899 Other long term (current) drug therapy: Secondary | ICD-10-CM

## 2020-12-11 DIAGNOSIS — E039 Hypothyroidism, unspecified: Secondary | ICD-10-CM | POA: Diagnosis present

## 2020-12-11 DIAGNOSIS — S066X1S Traumatic subarachnoid hemorrhage with loss of consciousness of 30 minutes or less, sequela: Secondary | ICD-10-CM | POA: Diagnosis not present

## 2020-12-11 DIAGNOSIS — Z96659 Presence of unspecified artificial knee joint: Secondary | ICD-10-CM | POA: Diagnosis present

## 2020-12-11 DIAGNOSIS — F0781 Postconcussional syndrome: Secondary | ICD-10-CM | POA: Diagnosis not present

## 2020-12-11 DIAGNOSIS — S066X0A Traumatic subarachnoid hemorrhage without loss of consciousness, initial encounter: Secondary | ICD-10-CM | POA: Diagnosis not present

## 2020-12-11 DIAGNOSIS — R001 Bradycardia, unspecified: Secondary | ICD-10-CM | POA: Diagnosis not present

## 2020-12-11 DIAGNOSIS — E119 Type 2 diabetes mellitus without complications: Secondary | ICD-10-CM | POA: Diagnosis present

## 2020-12-11 DIAGNOSIS — K5900 Constipation, unspecified: Secondary | ICD-10-CM | POA: Diagnosis present

## 2020-12-11 DIAGNOSIS — K219 Gastro-esophageal reflux disease without esophagitis: Secondary | ICD-10-CM | POA: Diagnosis present

## 2020-12-11 DIAGNOSIS — S066XAA Traumatic subarachnoid hemorrhage with loss of consciousness status unknown, initial encounter: Secondary | ICD-10-CM | POA: Diagnosis present

## 2020-12-11 DIAGNOSIS — S066X1D Traumatic subarachnoid hemorrhage with loss of consciousness of 30 minutes or less, subsequent encounter: Secondary | ICD-10-CM | POA: Diagnosis not present

## 2020-12-11 DIAGNOSIS — Z79890 Hormone replacement therapy (postmenopausal): Secondary | ICD-10-CM

## 2020-12-11 DIAGNOSIS — Z90710 Acquired absence of both cervix and uterus: Secondary | ICD-10-CM | POA: Diagnosis not present

## 2020-12-11 DIAGNOSIS — G2581 Restless legs syndrome: Secondary | ICD-10-CM | POA: Diagnosis present

## 2020-12-11 DIAGNOSIS — D509 Iron deficiency anemia, unspecified: Secondary | ICD-10-CM | POA: Diagnosis present

## 2020-12-11 DIAGNOSIS — J984 Other disorders of lung: Secondary | ICD-10-CM | POA: Diagnosis not present

## 2020-12-11 DIAGNOSIS — I959 Hypotension, unspecified: Secondary | ICD-10-CM | POA: Diagnosis not present

## 2020-12-11 DIAGNOSIS — I517 Cardiomegaly: Secondary | ICD-10-CM | POA: Diagnosis present

## 2020-12-11 DIAGNOSIS — Y92009 Unspecified place in unspecified non-institutional (private) residence as the place of occurrence of the external cause: Secondary | ICD-10-CM

## 2020-12-11 DIAGNOSIS — S06310A Contusion and laceration of right cerebrum without loss of consciousness, initial encounter: Secondary | ICD-10-CM | POA: Diagnosis not present

## 2020-12-11 DIAGNOSIS — S065X0A Traumatic subdural hemorrhage without loss of consciousness, initial encounter: Principal | ICD-10-CM | POA: Diagnosis present

## 2020-12-11 DIAGNOSIS — Z043 Encounter for examination and observation following other accident: Secondary | ICD-10-CM | POA: Diagnosis not present

## 2020-12-11 DIAGNOSIS — I10 Essential (primary) hypertension: Secondary | ICD-10-CM | POA: Diagnosis not present

## 2020-12-11 DIAGNOSIS — R079 Chest pain, unspecified: Secondary | ICD-10-CM | POA: Diagnosis not present

## 2020-12-11 DIAGNOSIS — G8929 Other chronic pain: Secondary | ICD-10-CM | POA: Diagnosis present

## 2020-12-11 DIAGNOSIS — S2243XA Multiple fractures of ribs, bilateral, initial encounter for closed fracture: Secondary | ICD-10-CM | POA: Diagnosis present

## 2020-12-11 DIAGNOSIS — R0781 Pleurodynia: Secondary | ICD-10-CM | POA: Diagnosis not present

## 2020-12-11 DIAGNOSIS — K449 Diaphragmatic hernia without obstruction or gangrene: Secondary | ICD-10-CM | POA: Diagnosis present

## 2020-12-11 DIAGNOSIS — I361 Nonrheumatic tricuspid (valve) insufficiency: Secondary | ICD-10-CM | POA: Diagnosis not present

## 2020-12-11 DIAGNOSIS — J90 Pleural effusion, not elsewhere classified: Secondary | ICD-10-CM | POA: Diagnosis not present

## 2020-12-11 DIAGNOSIS — R0789 Other chest pain: Secondary | ICD-10-CM | POA: Diagnosis not present

## 2020-12-11 DIAGNOSIS — Z7984 Long term (current) use of oral hypoglycemic drugs: Secondary | ICD-10-CM

## 2020-12-11 DIAGNOSIS — Z20822 Contact with and (suspected) exposure to covid-19: Secondary | ICD-10-CM | POA: Diagnosis present

## 2020-12-11 DIAGNOSIS — I6200 Nontraumatic subdural hemorrhage, unspecified: Secondary | ICD-10-CM | POA: Diagnosis not present

## 2020-12-11 DIAGNOSIS — I700 Atherosclerosis of aorta: Secondary | ICD-10-CM | POA: Diagnosis not present

## 2020-12-11 DIAGNOSIS — R0902 Hypoxemia: Secondary | ICD-10-CM | POA: Diagnosis not present

## 2020-12-11 DIAGNOSIS — R42 Dizziness and giddiness: Secondary | ICD-10-CM | POA: Diagnosis not present

## 2020-12-11 DIAGNOSIS — R1314 Dysphagia, pharyngoesophageal phase: Secondary | ICD-10-CM | POA: Diagnosis present

## 2020-12-11 DIAGNOSIS — I351 Nonrheumatic aortic (valve) insufficiency: Secondary | ICD-10-CM | POA: Diagnosis not present

## 2020-12-11 DIAGNOSIS — S299XXA Unspecified injury of thorax, initial encounter: Secondary | ICD-10-CM | POA: Diagnosis not present

## 2020-12-11 DIAGNOSIS — M545 Low back pain, unspecified: Secondary | ICD-10-CM | POA: Diagnosis not present

## 2020-12-11 DIAGNOSIS — S066X0D Traumatic subarachnoid hemorrhage without loss of consciousness, subsequent encounter: Secondary | ICD-10-CM | POA: Diagnosis not present

## 2020-12-11 DIAGNOSIS — W19XXXA Unspecified fall, initial encounter: Secondary | ICD-10-CM

## 2020-12-11 DIAGNOSIS — M419 Scoliosis, unspecified: Secondary | ICD-10-CM | POA: Diagnosis not present

## 2020-12-11 DIAGNOSIS — S0101XA Laceration without foreign body of scalp, initial encounter: Secondary | ICD-10-CM | POA: Diagnosis not present

## 2020-12-11 DIAGNOSIS — W1830XD Fall on same level, unspecified, subsequent encounter: Secondary | ICD-10-CM | POA: Diagnosis not present

## 2020-12-11 DIAGNOSIS — E041 Nontoxic single thyroid nodule: Secondary | ICD-10-CM | POA: Diagnosis not present

## 2020-12-11 DIAGNOSIS — G319 Degenerative disease of nervous system, unspecified: Secondary | ICD-10-CM | POA: Diagnosis not present

## 2020-12-11 DIAGNOSIS — I615 Nontraumatic intracerebral hemorrhage, intraventricular: Secondary | ICD-10-CM | POA: Diagnosis not present

## 2020-12-11 DIAGNOSIS — S199XXA Unspecified injury of neck, initial encounter: Secondary | ICD-10-CM | POA: Diagnosis not present

## 2020-12-11 DIAGNOSIS — S066X0S Traumatic subarachnoid hemorrhage without loss of consciousness, sequela: Secondary | ICD-10-CM | POA: Diagnosis not present

## 2020-12-11 MED ORDER — GABAPENTIN 300 MG PO CAPS
300.0000 mg | ORAL_CAPSULE | Freq: Once | ORAL | Status: AC
  Administered 2020-12-12: 300 mg via ORAL
  Filled 2020-12-11: qty 1

## 2020-12-11 MED ORDER — ONDANSETRON HCL 4 MG/2ML IJ SOLN
INTRAMUSCULAR | Status: AC
  Administered 2020-12-11: 4 mg
  Filled 2020-12-11: qty 2

## 2020-12-11 MED ORDER — ONDANSETRON HCL 4 MG/2ML IJ SOLN: 4.0000 mg | Freq: Once | INTRAMUSCULAR | Status: DC

## 2020-12-11 MED ORDER — ONDANSETRON 4 MG PO TBDP: 4.0000 mg | ORAL_TABLET | Freq: Once | ORAL | Status: DC

## 2020-12-11 MED ORDER — ONDANSETRON HCL 4 MG/2ML IJ SOLN
4.0000 mg | Freq: Once | INTRAMUSCULAR | Status: AC
  Filled 2020-12-11: qty 2

## 2020-12-11 NOTE — ED Triage Notes (Signed)
Pt brought to  ED triage by [facility] via stretcher and placed in wheelchair after experiencing mechanical fall at personal residence. Pt was outside attempting to pick package up off round when she lost her footing on pavement and fell backwards, striking head. Currently complains of dizziness and nausea. No LOC reported and denies taking blood thinners. Pt Currently AOX4.

## 2020-12-11 NOTE — ED Notes (Signed)
Patient transported to X-ray 

## 2020-12-11 NOTE — ED Provider Notes (Signed)
Received call from radiology. CT head positive for small right temporal subarachnoid hemorrhage. Charge aware of need for room. Family updated at bedside.    [name] 12/11/20 2215    [name], MD 12/15/20 [phone]

## 2020-12-11 NOTE — ED Provider Notes (Signed)
[facility] EMERGENCY DEPARTMENT Provider Note   CSN: [id number] Arrival date & time: 12/11/20  2111     History Chief Complaint  Patient presents with   Cheryl Stafford is a 87 y.o. female.  Patient to ED after mechanical fall backward, hitting her head on hard surface around 6:30 pm. She struggled to separate 2 packages she was trying to lift and fell back when they came apart. She states she got up afterward, no LOC, and felt significantly dizzy, vomiting and had onset worsening headache, frontal area greater than impact area in occipital area. No neck pain. She denies other injury. She reports exacerbation of chronic low back pain and chest pain that is familiar to her as severe reflux. Not anticoagulated.  The history is provided by the patient and a relative. No language interpreter was used.  Fall Associated symptoms include chest pain and headaches. Pertinent negatives include no abdominal pain and no shortness of breath.      Past Medical History:  Diagnosis Date   Esophageal dysphagia    Functional diarrhea    GERD (gastroesophageal reflux disease)    Hypertension    Hypothyroid    IDA (iron deficiency anemia)     There are no problems to display for this patient.   Past Surgical History:  Procedure Laterality Date   ABDOMINAL HYSTERECTOMY     COLONOSCOPY  06/07/2012   Colonic polyp status post polypectomy, Pancolonic diverticulosis redominantly in the sigmoid colon. Examination was limited due to qualitiy of preparation.   ESOPHAGOGASTRODUODENOSCOPY  07/30/2016   Highly torturous esophagus suggestive of motility disorder/presbyesophagus. Schatzki's ring status post esophageal dilatation. Large hiatal hernia. Mild gastritis.    TOTAL KNEE ARTHROPLASTY  01/02/2015     OB History   No obstetric history on file.     Family History  Problem Relation Age of Onset   Colon cancer Neg Hx    Stomach cancer Neg Hx     Social History    Tobacco Use   Smoking status: Never   Smokeless tobacco: Never  Vaping Use   Vaping Use: Never used  Substance Use Topics   Alcohol use: Never   Drug use: Never    Home Medications Prior to Admission medications   Medication Sig Start Date End Date Taking? Authorizing Provider  amLODipine-valsartan (EXFORGE) 5-160 MG tablet Take 1 tablet by mouth daily.    [provider]  escitalopram (LEXAPRO) 20 MG tablet Take 20 mg by mouth daily. 09/25/20   [provider]  gabapentin (NEURONTIN) 300 MG capsule Take 300 mg by mouth daily. 10/18/20   [provider]  levothyroxine (SYNTHROID) 137 MCG tablet Take 137 mcg by mouth daily. 10/25/20   [provider]  LORazepam (ATIVAN) 1 MG tablet Take 1 mg by mouth 3 (three) times daily. 09/17/20   [provider]  pantoprazole (PROTONIX) 40 MG tablet 40 mg daily. 04/05/20   [provider]  pramipexole (MIRAPEX) 0.25 MG tablet Take 0.25 mg by mouth at bedtime. 09/17/20   [provider]  propranolol (INDERAL) 10 MG tablet Take 10 mg by mouth 2 (two) times daily. 10/08/20   [provider]  rOPINIRole (REQUIP) 0.5 MG tablet 0.5 mg. 09/13/19   [provider]  zolpidem (AMBIEN) 10 MG tablet 10 mg at bedtime as needed. 03/16/20   [provider]    Allergies    Codeine  Review of Systems   Review of Systems  Constitutional:  Negative for fever.  HENT: Negative.  Negative for congestion and nosebleeds.   Eyes:  Negative for visual disturbance.  Respiratory:  Negative for cough and shortness of breath.   Cardiovascular:  Positive for chest pain.  Gastrointestinal:  Positive for nausea and vomiting. Negative for abdominal pain and diarrhea.  Genitourinary:  Negative for dysuria.  Musculoskeletal:  Positive for back pain. Negative for neck pain.  Skin:  Positive for wound.  Neurological:  Positive for dizziness and headaches. Negative for syncope.   Psychiatric/Behavioral:  Negative for confusion.    Physical Exam Updated Vital Signs BP 109/73   Pulse 69   Temp 98.3 F (36.8 C) (Oral)   Resp 15   SpO2 95%   Physical Exam Vitals and nursing note reviewed.  Constitutional:      General: She is not in acute distress.    Appearance: She is well-developed. She is not diaphoretic.  HENT:     Head: Normocephalic.     Comments: Left occipital hematoma with small laceration    Nose: Nose normal.     Mouth/Throat:     Mouth: Mucous membranes are moist.  Eyes:     Pupils: Pupils are equal, round, and reactive to light.  Cardiovascular:     Rate and Rhythm: Normal rate and regular rhythm.     Heart sounds: No murmur heard. Pulmonary:     Effort: Pulmonary effort is normal.     Breath sounds: Normal breath sounds. No wheezing, rhonchi or rales.  Chest:     Chest wall: Tenderness (Center chest) present.  Abdominal:     General: Bowel sounds are normal.     Palpations: Abdomen is soft.     Tenderness: There is no abdominal tenderness. There is no guarding or rebound.  Musculoskeletal:        General: Normal range of motion.     Cervical back: Normal range of motion and neck supple. No tenderness.     Comments: Moves all extremities. No hip or pelvic tenderness. Lumbar midline tenderness without stepoff. No midline cervical tenderness.   Skin:    General: Skin is warm and dry.  Neurological:     General: No focal deficit present.     Mental Status: She is alert and oriented to person, place, and time.     Sensory: No sensory deficit.     Coordination: Coordination normal.     Comments: Baseline tremor.     ED Results / Procedures / Treatments   Labs (all labs ordered are listed, but only abnormal results are displayed) Labs Reviewed - No data to display  EKG None  Radiology CT Head Wo Contrast  Result Date: 12/11/2020 CLINICAL DATA:  Trauma. EXAM: CT HEAD WITHOUT CONTRAST CT CERVICAL SPINE WITHOUT CONTRAST  TECHNIQUE: Multidetector CT imaging of the head and cervical spine was performed following the standard protocol without intravenous contrast. Multiplanar CT image reconstructions of the cervical spine were also generated. COMPARISON:  None. FINDINGS: CT HEAD FINDINGS Brain: There is minimal right temporal subarachnoid hemorrhage (12/3, 33/5). No mass effect or midline shift. There is mild age-related atrophy and chronic microvascular ischemic changes. Forty Vascular: No hyperdense vessel or unexpected calcification. Skull: Normal. Negative for fracture or focal lesion. Sinuses/Orbits: No acute finding. Other: Left posterior parietal scalp laceration and hematoma. CT CERVICAL SPINE FINDINGS Alignment: No acute subluxation. There is straightening of normal cervical lordosis which may be positional or due to muscle spasm. Skull base and vertebrae: No acute fracture. Soft tissues  and spinal canal: No prevertebral fluid or swelling. No visible canal hematoma. Disc levels:  Multilevel degenerative changes Upper chest: Biapical subpleural scarring. Other: A 2 cm rim calcified right thyroid nodule present since 2005. Stability for greater than 5 years implies benignity; no biopsy or followup indicated (ref: J Am Coll Radiol. 2015 Feb;12(2): 143-50). IMPRESSION: 1. Minimal right temporal subarachnoid hemorrhage. No mass effect or midline shift. 2. No acute/traumatic cervical spine pathology. These results were called by telephone at the time of interpretation on 12/11/2020 at 9:57 pm to provider [facility] , who verbally acknowledged these results. Electronically Signed   By: Anner Crete M.D.   On: 12/11/2020 22:03   CT Cervical Spine Wo Contrast  Result Date: 12/11/2020 CLINICAL DATA:  Trauma. EXAM: CT HEAD WITHOUT CONTRAST CT CERVICAL SPINE WITHOUT CONTRAST TECHNIQUE: Multidetector CT imaging of the head and cervical spine was performed following the standard protocol without intravenous contrast.  Multiplanar CT image reconstructions of the cervical spine were also generated. COMPARISON:  None. FINDINGS: CT HEAD FINDINGS Brain: There is minimal right temporal subarachnoid hemorrhage (12/3, 33/5). No mass effect or midline shift. There is mild age-related atrophy and chronic microvascular ischemic changes. Forty Vascular: No hyperdense vessel or unexpected calcification. Skull: Normal. Negative for fracture or focal lesion. Sinuses/Orbits: No acute finding. Other: Left posterior parietal scalp laceration and hematoma. CT CERVICAL SPINE FINDINGS Alignment: No acute subluxation. There is straightening of normal cervical lordosis which may be positional or due to muscle spasm. Skull base and vertebrae: No acute fracture. Soft tissues and spinal canal: No prevertebral fluid or swelling. No visible canal hematoma. Disc levels:  Multilevel degenerative changes Upper chest: Biapical subpleural scarring. Other: A 2 cm rim calcified right thyroid nodule present since 2005. Stability for greater than 5 years implies benignity; no biopsy or followup indicated (ref: J Am Coll Radiol. 2015 Feb;12(2): 143-50). IMPRESSION: 1. Minimal right temporal subarachnoid hemorrhage. No mass effect or midline shift. 2. No acute/traumatic cervical spine pathology. These results were called by telephone at the time of interpretation on 12/11/2020 at 9:57 pm to provider [facility] , who verbally acknowledged these results. Electronically Signed   By: Anner Crete M.D.   On: 12/11/2020 22:03    Procedures Procedures   Medications Ordered in ED Medications  ondansetron (ZOFRAN-ODT) disintegrating tablet 4 mg (4 mg Oral Not Given 12/11/20 2209)  ondansetron (ZOFRAN) 4 MG/2ML injection (4 mg  Given 12/11/20 2209)    ED Course  I have reviewed the triage vital signs and the nursing notes.  Pertinent labs & imaging results that were available during my care of the patient were reviewed by me and considered in my medical  decision making (see chart for details).    MDM Rules/Calculators/A&P                           Patient to eD after mechanical fall with head injury. No LOC, as detailed in the HPI.   Frail patient, awake, alert, oriented. Complains of headache, persistent nausea without persistent vomiting, chest pain and lower back pain. Chest and back are chronic symptoms from before the fall. No trouble breathing.   CT head and neck ordered during the triage process are reviewed. C-spine negative, collar removed for patient comfort. CT head shows small SAH right temporal area, per radiology. Findings discussed with neurosurgery, Dr. Zada Finders, who advises nothing to be done from his standpoint, favors discharge home if no other reason to  admit.   The patient is nauseous. She vomited earlier but no emesis since. She becomes dizzy/lightheaded with any attempt to sit up. Standing not attempted. Zofran provided with relief of nausea. Lumbar film negative. Patient's gabapentin dose for chronic pain provided and back pain improved. Injury from fall seem isolated to head injury. She remains oriented without mental status changes. No change on serial neuro exams.   On final recheck, the patient remains significantly dizzy when asked to sit up, onset nausea. Better when lying down. Will discuss admission with hospitalist.  Final Clinical Impression(s) / ED Diagnoses Final diagnoses:  None   Post-concussive Syndrome Head injury Fall at home  Rx / DC Orders ED Discharge Orders     None        Charlann Lange, Hershal Coria 12/12/20 4859    Drenda Freeze, MD 12/18/20 [phone]

## 2020-12-11 NOTE — ED Notes (Signed)
Patient is resting comfortably. Daughter at bedside.  Discussed POC. Waiting to be seen by provider

## 2020-12-11 NOTE — ED Provider Notes (Signed)
Emergency Medicine Provider Triage Evaluation Note  Cheryl Stafford , a 87 y.o. female  was evaluated in triage.  Pt complains of mechanical fall while attempting to pick up a package.  Patient fell backwards and struck the posterior aspect of her head.  She is not currently on any blood thinners.  No loss of consciousness.  Patient endorses nausea, vomiting, and dizziness.  Review of Systems  Positive: Headache, N/D, dizziness Negative: CP  Physical Exam  BP 113/61 (BP Location: Left Arm)   Pulse 73   Temp 98.3 F (36.8 C) (Oral)   Resp 17   SpO2 93%  Gen:   Awake, no distress   Resp:  Normal effort  MSK:   Moves extremities without difficulty  Other:  Small laceration to posterior aspect of head. Normal speech, no facial droop, equal grip strength. AAOx4.  Medical Decision Making  Medically screening exam initiated at 9:27 PM.  Appropriate orders placed.  [name] was informed that the remainder of the evaluation will be completed by another provider, this initial triage assessment does not replace that evaluation, and the importance of remaining in the ED until their evaluation is complete.  Called CT to get patient to the scanner to rule out intracranial hemorrhage.  Charge RN informed of need of room.   [name] 12/11/20 2132    [name], MD 12/11/20 2136

## 2020-12-11 NOTE — ED Notes (Signed)
ED Provider at bedside. 

## 2020-12-12 ENCOUNTER — Encounter (HOSPITAL_COMMUNITY): Payer: Self-pay | Admitting: Internal Medicine

## 2020-12-12 ENCOUNTER — Observation Stay (HOSPITAL_COMMUNITY): Payer: Medicare Other

## 2020-12-12 DIAGNOSIS — Z79899 Other long term (current) drug therapy: Secondary | ICD-10-CM | POA: Diagnosis not present

## 2020-12-12 DIAGNOSIS — G2581 Restless legs syndrome: Secondary | ICD-10-CM | POA: Diagnosis present

## 2020-12-12 DIAGNOSIS — R0789 Other chest pain: Secondary | ICD-10-CM | POA: Diagnosis not present

## 2020-12-12 DIAGNOSIS — J90 Pleural effusion, not elsewhere classified: Secondary | ICD-10-CM | POA: Diagnosis not present

## 2020-12-12 DIAGNOSIS — I340 Nonrheumatic mitral (valve) insufficiency: Secondary | ICD-10-CM | POA: Diagnosis not present

## 2020-12-12 DIAGNOSIS — K449 Diaphragmatic hernia without obstruction or gangrene: Secondary | ICD-10-CM | POA: Diagnosis present

## 2020-12-12 DIAGNOSIS — Y92009 Unspecified place in unspecified non-institutional (private) residence as the place of occurrence of the external cause: Secondary | ICD-10-CM | POA: Diagnosis not present

## 2020-12-12 DIAGNOSIS — S066X0S Traumatic subarachnoid hemorrhage without loss of consciousness, sequela: Secondary | ICD-10-CM | POA: Diagnosis not present

## 2020-12-12 DIAGNOSIS — S066X0D Traumatic subarachnoid hemorrhage without loss of consciousness, subsequent encounter: Secondary | ICD-10-CM | POA: Diagnosis present

## 2020-12-12 DIAGNOSIS — R079 Chest pain, unspecified: Secondary | ICD-10-CM | POA: Diagnosis not present

## 2020-12-12 DIAGNOSIS — S0101XA Laceration without foreign body of scalp, initial encounter: Secondary | ICD-10-CM | POA: Diagnosis present

## 2020-12-12 DIAGNOSIS — G8929 Other chronic pain: Secondary | ICD-10-CM | POA: Diagnosis present

## 2020-12-12 DIAGNOSIS — I351 Nonrheumatic aortic (valve) insufficiency: Secondary | ICD-10-CM | POA: Diagnosis not present

## 2020-12-12 DIAGNOSIS — E039 Hypothyroidism, unspecified: Secondary | ICD-10-CM | POA: Diagnosis present

## 2020-12-12 DIAGNOSIS — I517 Cardiomegaly: Secondary | ICD-10-CM | POA: Diagnosis present

## 2020-12-12 DIAGNOSIS — M545 Low back pain, unspecified: Secondary | ICD-10-CM | POA: Diagnosis not present

## 2020-12-12 DIAGNOSIS — S066X1S Traumatic subarachnoid hemorrhage with loss of consciousness of 30 minutes or less, sequela: Secondary | ICD-10-CM | POA: Diagnosis not present

## 2020-12-12 DIAGNOSIS — Z90710 Acquired absence of both cervix and uterus: Secondary | ICD-10-CM | POA: Diagnosis not present

## 2020-12-12 DIAGNOSIS — W1830XA Fall on same level, unspecified, initial encounter: Secondary | ICD-10-CM | POA: Diagnosis present

## 2020-12-12 DIAGNOSIS — S066X1D Traumatic subarachnoid hemorrhage with loss of consciousness of 30 minutes or less, subsequent encounter: Secondary | ICD-10-CM | POA: Diagnosis not present

## 2020-12-12 DIAGNOSIS — S2243XA Multiple fractures of ribs, bilateral, initial encounter for closed fracture: Secondary | ICD-10-CM | POA: Diagnosis present

## 2020-12-12 DIAGNOSIS — I959 Hypotension, unspecified: Secondary | ICD-10-CM | POA: Diagnosis present

## 2020-12-12 DIAGNOSIS — S06310A Contusion and laceration of right cerebrum without loss of consciousness, initial encounter: Secondary | ICD-10-CM | POA: Diagnosis not present

## 2020-12-12 DIAGNOSIS — I615 Nontraumatic intracerebral hemorrhage, intraventricular: Secondary | ICD-10-CM | POA: Diagnosis not present

## 2020-12-12 DIAGNOSIS — F0781 Postconcussional syndrome: Secondary | ICD-10-CM | POA: Diagnosis present

## 2020-12-12 DIAGNOSIS — S299XXA Unspecified injury of thorax, initial encounter: Secondary | ICD-10-CM | POA: Diagnosis not present

## 2020-12-12 DIAGNOSIS — S065X0A Traumatic subdural hemorrhage without loss of consciousness, initial encounter: Secondary | ICD-10-CM | POA: Diagnosis present

## 2020-12-12 DIAGNOSIS — I361 Nonrheumatic tricuspid (valve) insufficiency: Secondary | ICD-10-CM | POA: Diagnosis not present

## 2020-12-12 DIAGNOSIS — E119 Type 2 diabetes mellitus without complications: Secondary | ICD-10-CM | POA: Diagnosis present

## 2020-12-12 DIAGNOSIS — D509 Iron deficiency anemia, unspecified: Secondary | ICD-10-CM | POA: Diagnosis present

## 2020-12-12 DIAGNOSIS — Z79890 Hormone replacement therapy (postmenopausal): Secondary | ICD-10-CM | POA: Diagnosis not present

## 2020-12-12 DIAGNOSIS — I10 Essential (primary) hypertension: Secondary | ICD-10-CM | POA: Diagnosis present

## 2020-12-12 DIAGNOSIS — W1830XD Fall on same level, unspecified, subsequent encounter: Secondary | ICD-10-CM | POA: Diagnosis not present

## 2020-12-12 DIAGNOSIS — Z885 Allergy status to narcotic agent status: Secondary | ICD-10-CM | POA: Diagnosis not present

## 2020-12-12 DIAGNOSIS — I6200 Nontraumatic subdural hemorrhage, unspecified: Secondary | ICD-10-CM | POA: Diagnosis not present

## 2020-12-12 DIAGNOSIS — S066X0A Traumatic subarachnoid hemorrhage without loss of consciousness, initial encounter: Secondary | ICD-10-CM | POA: Diagnosis present

## 2020-12-12 DIAGNOSIS — I700 Atherosclerosis of aorta: Secondary | ICD-10-CM | POA: Diagnosis not present

## 2020-12-12 DIAGNOSIS — K219 Gastro-esophageal reflux disease without esophagitis: Secondary | ICD-10-CM | POA: Diagnosis present

## 2020-12-12 DIAGNOSIS — K5900 Constipation, unspecified: Secondary | ICD-10-CM | POA: Diagnosis present

## 2020-12-12 DIAGNOSIS — M419 Scoliosis, unspecified: Secondary | ICD-10-CM | POA: Diagnosis not present

## 2020-12-12 DIAGNOSIS — R7303 Prediabetes: Secondary | ICD-10-CM

## 2020-12-12 DIAGNOSIS — K5901 Slow transit constipation: Secondary | ICD-10-CM | POA: Diagnosis not present

## 2020-12-12 DIAGNOSIS — G319 Degenerative disease of nervous system, unspecified: Secondary | ICD-10-CM | POA: Diagnosis not present

## 2020-12-12 DIAGNOSIS — S066XAA Traumatic subarachnoid hemorrhage with loss of consciousness status unknown, initial encounter: Secondary | ICD-10-CM | POA: Diagnosis present

## 2020-12-12 DIAGNOSIS — Z20822 Contact with and (suspected) exposure to covid-19: Secondary | ICD-10-CM | POA: Diagnosis present

## 2020-12-12 DIAGNOSIS — R001 Bradycardia, unspecified: Secondary | ICD-10-CM | POA: Diagnosis not present

## 2020-12-12 DIAGNOSIS — Z7984 Long term (current) use of oral hypoglycemic drugs: Secondary | ICD-10-CM | POA: Diagnosis not present

## 2020-12-12 DIAGNOSIS — Z96659 Presence of unspecified artificial knee joint: Secondary | ICD-10-CM | POA: Diagnosis present

## 2020-12-12 DIAGNOSIS — R1314 Dysphagia, pharyngoesophageal phase: Secondary | ICD-10-CM | POA: Diagnosis present

## 2020-12-12 HISTORY — DX: Prediabetes: R73.03

## 2020-12-12 LAB — CBC WITH DIFFERENTIAL/PLATELET
Abs Immature Granulocytes: 0.07 10*3/uL (ref 0.00–0.07)
Abs Immature Granulocytes: 0.02 10*3/uL (ref 0.00–0.07)
Basophils Absolute: 0.1 10*3/uL (ref 0.0–0.1)
Basophils Absolute: 0 10*3/uL (ref 0.0–0.1)
Basophils Relative: 1 %
Basophils Relative: 0 %
Eosinophils Absolute: 0 10*3/uL (ref 0.0–0.5)
Eosinophils Absolute: 0 10*3/uL (ref 0.0–0.5)
Eosinophils Relative: 0 %
Eosinophils Relative: 0 %
HCT: 35.5 % — ABNORMAL LOW (ref 36.0–46.0)
HCT: 33.3 % — ABNORMAL LOW (ref 36.0–46.0)
Hemoglobin: 11.7 g/dL — ABNORMAL LOW (ref 12.0–15.0)
Hemoglobin: 10.8 g/dL — ABNORMAL LOW (ref 12.0–15.0)
Immature Granulocytes: 1 %
Immature Granulocytes: 0 %
Lymphocytes Relative: 8 %
Lymphocytes Relative: 19 %
Lymphs Abs: 1 10*3/uL (ref 0.7–4.0)
Lymphs Abs: 1.8 10*3/uL (ref 0.7–4.0)
MCH: 31 pg (ref 26.0–34.0)
MCH: 30.9 pg (ref 26.0–34.0)
MCHC: 33 g/dL (ref 30.0–36.0)
MCHC: 32.4 g/dL (ref 30.0–36.0)
MCV: 94.2 fL (ref 80.0–100.0)
MCV: 95.1 fL (ref 80.0–100.0)
Monocytes Absolute: 0.4 10*3/uL (ref 0.1–1.0)
Monocytes Absolute: 0.7 10*3/uL (ref 0.1–1.0)
Monocytes Relative: 3 %
Monocytes Relative: 8 %
Neutro Abs: 11.5 10*3/uL — ABNORMAL HIGH (ref 1.7–7.7)
Neutro Abs: 6.7 10*3/uL (ref 1.7–7.7)
Neutrophils Relative %: 87 %
Neutrophils Relative %: 73 %
Platelets: 229 10*3/uL (ref 150–400)
Platelets: 218 10*3/uL (ref 150–400)
RBC: 3.77 MIL/uL — ABNORMAL LOW (ref 3.87–5.11)
RBC: 3.5 MIL/uL — ABNORMAL LOW (ref 3.87–5.11)
RDW: 12.6 % (ref 11.5–15.5)
RDW: 12.7 % (ref 11.5–15.5)
WBC: 13.1 10*3/uL — ABNORMAL HIGH (ref 4.0–10.5)
WBC: 9.2 10*3/uL (ref 4.0–10.5)
nRBC: 0 % (ref 0.0–0.2)
nRBC: 0 % (ref 0.0–0.2)

## 2020-12-12 LAB — ECHOCARDIOGRAM COMPLETE
AV Vena cont: 0.3 cm
Area-P 1/2: 3.08 cm2
Calc EF: 55.7 %
P 1/2 time: 873 msec
S' Lateral: 2.5 cm
Single Plane A2C EF: 46 %
Single Plane A4C EF: 62.2 %

## 2020-12-12 LAB — URINALYSIS, COMPLETE (UACMP) WITH MICROSCOPIC
Bilirubin Urine: NEGATIVE
Glucose, UA: NEGATIVE mg/dL
Hgb urine dipstick: NEGATIVE
Ketones, ur: NEGATIVE mg/dL
Leukocytes,Ua: NEGATIVE
Nitrite: NEGATIVE
Protein, ur: NEGATIVE mg/dL
Specific Gravity, Urine: 1.015 (ref 1.005–1.030)
pH: 6.5 (ref 5.0–8.0)

## 2020-12-12 LAB — COMPREHENSIVE METABOLIC PANEL
ALT: 15 U/L (ref 0–44)
ALT: 14 U/L (ref 0–44)
AST: 22 U/L (ref 15–41)
AST: 19 U/L (ref 15–41)
Albumin: 3.4 g/dL — ABNORMAL LOW (ref 3.5–5.0)
Albumin: 3.3 g/dL — ABNORMAL LOW (ref 3.5–5.0)
Alkaline Phosphatase: 46 U/L (ref 38–126)
Alkaline Phosphatase: 39 U/L (ref 38–126)
Anion gap: 7 (ref 5–15)
Anion gap: 6 (ref 5–15)
BUN: 17 mg/dL (ref 8–23)
BUN: 15 mg/dL (ref 8–23)
CO2: 25 mmol/L (ref 22–32)
CO2: 29 mmol/L (ref 22–32)
Calcium: 8.8 mg/dL — ABNORMAL LOW (ref 8.9–10.3)
Calcium: 8.9 mg/dL (ref 8.9–10.3)
Chloride: 101 mmol/L (ref 98–111)
Chloride: 98 mmol/L (ref 98–111)
Creatinine, Ser: 0.73 mg/dL (ref 0.44–1.00)
Creatinine, Ser: 0.84 mg/dL (ref 0.44–1.00)
GFR, Estimated: >60 mL/min (ref >60)
GFR, Estimated: >60 mL/min (ref >60)
Glucose, Bld: 150 mg/dL — ABNORMAL HIGH (ref 70–99)
Glucose, Bld: 114 mg/dL — ABNORMAL HIGH (ref 70–99)
Potassium: 3.9 mmol/L (ref 3.5–5.1)
Potassium: 4 mmol/L (ref 3.5–5.1)
Sodium: 133 mmol/L — ABNORMAL LOW (ref 135–145)
Sodium: 133 mmol/L — ABNORMAL LOW (ref 135–145)
Total Bilirubin: 0.5 mg/dL (ref 0.3–1.2)
Total Bilirubin: 0.8 mg/dL (ref 0.3–1.2)
Total Protein: 6.1 g/dL — ABNORMAL LOW (ref 6.5–8.1)
Total Protein: 5.8 g/dL — ABNORMAL LOW (ref 6.5–8.1)

## 2020-12-12 LAB — APTT
aPTT: <20 seconds — ABNORMAL LOW (ref 24–36)
aPTT: 25 seconds (ref 24–36)

## 2020-12-12 LAB — CBG MONITORING, ED
Glucose-Capillary: 98 mg/dL (ref 70–99)
Glucose-Capillary: 114 mg/dL — ABNORMAL HIGH (ref 70–99)
Glucose-Capillary: 131 mg/dL — ABNORMAL HIGH (ref 70–99)

## 2020-12-12 LAB — PROTIME-INR
INR: 1.1 (ref 0.8–1.2)
Prothrombin Time: 13.7 seconds (ref 11.4–15.2)

## 2020-12-12 LAB — RESP PANEL BY RT-PCR (FLU A&B, COVID) ARPGX2
Influenza A by PCR: NEGATIVE
Influenza B by PCR: NEGATIVE
SARS Coronavirus 2 by RT PCR: NEGATIVE

## 2020-12-12 LAB — TROPONIN I (HIGH SENSITIVITY)
Troponin I (High Sensitivity): 7 ng/L (ref <18)
Troponin I (High Sensitivity): 6 ng/L (ref <18)

## 2020-12-12 LAB — GLUCOSE, CAPILLARY: Glucose-Capillary: 120 mg/dL — ABNORMAL HIGH (ref 70–99)

## 2020-12-12 LAB — SEDIMENTATION RATE: Sed Rate: 5 mm/hr (ref 0–22)

## 2020-12-12 LAB — MAGNESIUM: Magnesium: 2.4 mg/dL (ref 1.7–2.4)

## 2020-12-12 MED ORDER — PANTOPRAZOLE SODIUM 40 MG PO TBEC
40.0000 mg | DELAYED_RELEASE_TABLET | Freq: Every day | ORAL | Status: DC
  Administered 2020-12-12 – 2020-12-18 (×7): 40 mg via ORAL
  Filled 2020-12-12 (×7): qty 1

## 2020-12-12 MED ORDER — PRAMIPEXOLE DIHYDROCHLORIDE 0.25 MG PO TABS
0.2500 mg | ORAL_TABLET | Freq: Every day | ORAL | Status: DC
  Administered 2020-12-12 – 2020-12-17 (×6): 0.25 mg via ORAL
  Filled 2020-12-12 (×7): qty 1

## 2020-12-12 MED ORDER — AMLODIPINE BESYLATE-VALSARTAN 5-160 MG PO TABS: 1.0000 | ORAL_TABLET | Freq: Every day | ORAL | Status: DC

## 2020-12-12 MED ORDER — IRBESARTAN 300 MG PO TABS
300.0000 mg | ORAL_TABLET | Freq: Every day | ORAL | Status: DC
  Administered 2020-12-12: 300 mg via ORAL
  Filled 2020-12-12 (×2): qty 1

## 2020-12-12 MED ORDER — GABAPENTIN 300 MG PO CAPS
300.0000 mg | ORAL_CAPSULE | Freq: Every day | ORAL | Status: DC
  Administered 2020-12-12: 300 mg via ORAL
  Filled 2020-12-12: qty 1

## 2020-12-12 MED ORDER — GABAPENTIN 300 MG PO CAPS
300.0000 mg | ORAL_CAPSULE | Freq: Every day | ORAL | Status: DC
  Administered 2020-12-12 – 2020-12-17 (×6): 300 mg via ORAL
  Filled 2020-12-12 (×6): qty 1

## 2020-12-12 MED ORDER — INSULIN ASPART 100 UNIT/ML IJ SOLN
0.0000 [IU] | Freq: Three times a day (TID) | INTRAMUSCULAR | Status: DC
Start: 2020-12-12 — End: 2020-12-13
  Administered 2020-12-12: 2 [IU] via SUBCUTANEOUS

## 2020-12-12 MED ORDER — SODIUM CHLORIDE 0.9 % IV BOLUS
500.0000 mL | Freq: Once | INTRAVENOUS | Status: AC
  Administered 2020-12-12: 500 mL via INTRAVENOUS

## 2020-12-12 MED ORDER — LABETALOL HCL 5 MG/ML IV SOLN: 10.0000 mg | INTRAVENOUS | Status: DC | PRN

## 2020-12-12 MED ORDER — ONDANSETRON HCL 4 MG/2ML IJ SOLN
4.0000 mg | Freq: Four times a day (QID) | INTRAMUSCULAR | Status: DC | PRN
  Administered 2020-12-12 – 2020-12-15 (×2): 4 mg via INTRAVENOUS
  Filled 2020-12-12 (×2): qty 2

## 2020-12-12 MED ORDER — AMLODIPINE BESYLATE 5 MG PO TABS
5.0000 mg | ORAL_TABLET | Freq: Every day | ORAL | Status: DC
  Administered 2020-12-12: 5 mg via ORAL
  Filled 2020-12-12: qty 1

## 2020-12-12 MED ORDER — LEVOTHYROXINE SODIUM 25 MCG PO TABS
137.0000 ug | ORAL_TABLET | Freq: Every day | ORAL | Status: DC
  Administered 2020-12-12 – 2020-12-18 (×7): 137 ug via ORAL
  Filled 2020-12-12 (×7): qty 1

## 2020-12-12 MED ORDER — ESCITALOPRAM OXALATE 10 MG PO TABS
20.0000 mg | ORAL_TABLET | Freq: Every day | ORAL | Status: DC
  Administered 2020-12-12 – 2020-12-18 (×7): 20 mg via ORAL
  Filled 2020-12-12 (×7): qty 2

## 2020-12-12 MED ORDER — ACETAMINOPHEN 325 MG PO TABS
650.0000 mg | ORAL_TABLET | Freq: Four times a day (QID) | ORAL | Status: DC | PRN
  Administered 2020-12-12 – 2020-12-17 (×11): 650 mg via ORAL
  Filled 2020-12-12 (×12): qty 2

## 2020-12-12 MED ORDER — PROPRANOLOL HCL 10 MG PO TABS
10.0000 mg | ORAL_TABLET | Freq: Two times a day (BID) | ORAL | Status: DC
  Administered 2020-12-12 – 2020-12-17 (×10): 10 mg via ORAL
  Filled 2020-12-12 (×14): qty 1

## 2020-12-12 MED ORDER — ONDANSETRON HCL 4 MG PO TABS
4.0000 mg | ORAL_TABLET | Freq: Four times a day (QID) | ORAL | Status: DC | PRN
  Administered 2020-12-18: 4 mg via ORAL
  Filled 2020-12-12: qty 1

## 2020-12-12 MED ORDER — ACETAMINOPHEN 650 MG RE SUPP: 650.0000 mg | Freq: Four times a day (QID) | RECTAL | Status: DC | PRN

## 2020-12-12 MED ORDER — SODIUM CHLORIDE 0.9 % IV SOLN: INTRAVENOUS | Status: AC

## 2020-12-12 MED ORDER — POLYETHYLENE GLYCOL 3350 17 G PO PACK: 17.0000 g | PACK | Freq: Every day | ORAL | Status: DC | PRN

## 2020-12-12 NOTE — ED Notes (Signed)
O2 sat 90% on RA while pt sleeping. O2 started at 3L via [place].

## 2020-12-12 NOTE — H&P (Addendum)
History and Physical    Cheryl Stafford ZHY:[id number] DOB: 1929-11-25 DOA: 12/11/2020  PCP: Nicoletta Dress, MD  Patient coming from: Home via EMS   Chief Complaint:  Chief Complaint  Patient presents with   Fall     HPI:    87 year old female with past medical history of gastroesophageal reflux disease, large hiatal hernia with Schatzki's ring, hypertension, hypothyroidism and iron deficiency anemia, non-insulin-dependent diabetes mellitus type 2 who presents to [facility] emergency department via EMS status post fall at home.  Patient explains that she was attempting to pick up a package in the front of her house when she fell backwards, striking her head as she fell.  Patient complains of bleeding from her scalp afterwards.  This is associated with moderate headache located on the top of her scalp with associated nausea but no vomiting.  Patient denies any loss of consciousness any focal weakness any vertigo or any changes in vision.  EMS was contacted who promptly came to evaluate the patient and brought the patient into [facility] emergency department for evaluation.  Upon evaluation in the emergency department a trauma survey was performed revealing a minimal right temporal subarachnoid hemorrhage with no evidence of mass or affect or midline shift.  ER provider spoke to Dr. Venetia Constable with neurosurgery who reviewed the images and stated that neurosurgical involvement was not necessary and the patient did not need any follow-up CT imaging or outpatient neurosurgical follow-up.  Patient continued to experience weakness and intermittent vertigo throughout the emergency department stay with ER concerns for postconcussive syndrome and therefore the hospitalist group was then called to assess the patient for admission the hospital.  Review of Systems:   Review of Systems  Gastrointestinal:  Positive for nausea.  Musculoskeletal:  Positive for falls.  Neurological:   Positive for dizziness.  All other systems reviewed and are negative.  Past Medical History:  Diagnosis Date   Esophageal dysphagia    Functional diarrhea    GERD (gastroesophageal reflux disease)    Hypertension    Hypothyroid    IDA (iron deficiency anemia)    Prediabetes 12/12/2020    Past Surgical History:  Procedure Laterality Date   ABDOMINAL HYSTERECTOMY     COLONOSCOPY  06/07/2012   Colonic polyp status post polypectomy, Pancolonic diverticulosis redominantly in the sigmoid colon. Examination was limited due to qualitiy of preparation.   ESOPHAGOGASTRODUODENOSCOPY  07/30/2016   Highly torturous esophagus suggestive of motility disorder/presbyesophagus. Schatzki's ring status post esophageal dilatation. Large hiatal hernia. Mild gastritis.    TOTAL KNEE ARTHROPLASTY  01/02/2015     reports that she has never smoked. She has never used smokeless tobacco. She reports that she does not drink alcohol and does not use drugs.  Allergies  Allergen Reactions   Codeine Nausea Only    Family History  Problem Relation Age of Onset   Colon cancer Neg Hx    Stomach cancer Neg Hx      Prior to Admission medications   Medication Sig Start Date End Date Taking? Authorizing Provider  amLODipine-valsartan (EXFORGE) 5-160 MG tablet Take 1 tablet by mouth daily.    [provider]  escitalopram (LEXAPRO) 20 MG tablet Take 20 mg by mouth daily. 09/25/20   [provider]  gabapentin (NEURONTIN) 300 MG capsule Take 300 mg by mouth daily. 10/18/20   [provider]  levothyroxine (SYNTHROID) 137 MCG tablet Take 137 mcg by mouth daily. 10/25/20   [provider]  LORazepam (ATIVAN)  1 MG tablet Take 1 mg by mouth 3 (three) times daily. 09/17/20   [provider]  pantoprazole (PROTONIX) 40 MG tablet 40 mg daily. 04/05/20   [provider]  pramipexole (MIRAPEX) 0.25 MG tablet Take 0.25 mg by mouth at bedtime. 09/17/20   [provider]  propranolol (INDERAL) 10 MG tablet Take 10 mg by mouth 2 (two) times daily. 10/08/20   [provider]  rOPINIRole (REQUIP) 0.5 MG tablet 0.5 mg. 09/13/19   [provider]  zolpidem (AMBIEN) 10 MG tablet 10 mg at bedtime as needed. 03/16/20   [provider]    Physical Exam: Vitals:   12/12/20 0600 12/12/20 0615 12/12/20 0715 12/12/20 0830  BP: (!) 113/58 (!) 116/56 117/63 100/65  Pulse: 64 64 67 63  Resp: 20 (!) 21 19 (!) 22  Temp:      TempSrc:      SpO2: 98% 98% 98% 98%    Constitutional: Awake alert and oriented x3, no associated distress.   Skin: no rashes, no lesions, good skin turgor noted. Eyes: Pupils are equally reactive to light.  No evidence of scleral icterus or conjunctival pallor.  HEENT: Some dried blood on top of scalp from injury. Hemostasis is achieved.  Moist mucous membranes noted.  Posterior pharynx clear of any exudate or lesions.   Neck: normal, supple, no masses, no thyromegaly.  No evidence of jugular venous distension.   Respiratory: clear to auscultation bilaterally, no wheezing, no crackles. Normal respiratory effort. No accessory muscle use.  Cardiovascular: Regular rate and rhythm, no murmurs / rubs / gallops. No extremity edema. 2+ pedal pulses. No carotid bruits.  Chest:   Nontender without crepitus or deformity.   Back:   Nontender without crepitus or deformity. Abdomen: Abdomen is soft and nontender.  No evidence of intra-abdominal masses.  Positive bowel sounds noted in all quadrants.   Musculoskeletal: No joint deformity upper and lower extremities. Good ROM, no contractures. Normal muscle tone.  Neurologic: CN 2-12 grossly intact. Sensation intact.  Patient moving all 4 extremities spontaneously.  Patient is following all commands.  Patient is responsive to verbal stimuli.   Psychiatric: Patient exhibits normal mood with appropriate affect.  Patient seems to possess insight as to their current situation.      Labs on Admission: I have personally reviewed following labs and imaging studies -   CBC: Recent Labs  Lab 12/11/20 0050  WBC 13.1*  NEUTROABS 11.5*  HGB 11.7*  HCT 35.5*  MCV 94.2  PLT 660   Basic Metabolic Panel: Recent Labs  Lab 12/11/20 0050  NA 133*  K 3.9  CL 101  CO2 25  GLUCOSE 150*  BUN 17  CREATININE 0.73  CALCIUM 8.8*   GFR: CrCl cannot be calculated (Unknown ideal weight.). Liver Function Tests: Recent Labs  Lab 12/11/20 0050  AST 22  ALT 15  ALKPHOS 46  BILITOT 0.5  PROT 6.1*  ALBUMIN 3.4*   No results for input(s): LIPASE, AMYLASE in the last 168 hours. No results for input(s): AMMONIA in the last 168 hours. Coagulation Profile: Recent Labs  Lab 12/11/20 0050  INR 1.1   Cardiac Enzymes: No results for input(s): CKTOTAL, CKMB, CKMBINDEX, TROPONINI in the last 168 hours. BNP (last 3 results) No results for input(s): PROBNP in the last 8760 hours. HbA1C: No results for input(s): HGBA1C in the last 72 hours. CBG: Recent Labs  Lab 12/12/20 0733  GLUCAP 98   Lipid Profile: No results for input(s):  CHOL, HDL, LDLCALC, TRIG, CHOLHDL, LDLDIRECT in the last 72 hours. Thyroid Function Tests: No results for input(s): TSH, T4TOTAL, FREET4, T3FREE, THYROIDAB in the last 72 hours. Anemia Panel: No results for input(s): VITAMINB12, FOLATE, FERRITIN, TIBC, IRON, RETICCTPCT in the last 72 hours. Urine analysis: No results found for: COLORURINE, APPEARANCEUR, [place], [place], GLUCOSEU, HGBUR, BILIRUBINUR, KETONESUR, PROTEINUR, UROBILINOGEN, NITRITE, LEUKOCYTESUR  Radiological Exams on Admission - Personally Reviewed: DG Chest 1 View  Result Date: 12/11/2020 CLINICAL DATA:  Fall. EXAM: CHEST  1 VIEW COMPARISON:  Chest x-ray 08/14/2007.  CT PE protocol 04/27/2003. FINDINGS: The cardiac silhouette is moderately enlarged, a new finding. The lungs and costophrenic angles are clear. There is no pneumothorax. Peripherally calcified rounded density in  the lower right neck measuring 2 cm is unchanged from prior examination corresponding to peripherally calcified right thyroid nodule. The osseous structures are within normal limits. IMPRESSION: 1. New moderate enlargement of the cardiac silhouette may represent cardiomegaly and/or pericardial effusion. 2. Lungs are clear. Electronically Signed   By: Ronney Asters M.D.   On: 12/11/2020 23:59   DG Lumbar Spine Complete  Result Date: 12/11/2020 CLINICAL DATA:  Fall EXAM: LUMBAR SPINE - COMPLETE 4+ VIEW COMPARISON:  02/29/2016 FINDINGS: Dextroscoliosis. Vertebral body heights are maintained. Multilevel facet arthrosis. IMPRESSION: No acute fracture or static subluxation of the lumbar spine. Marked right convex scoliosis. Electronically Signed   By: Ulyses Jarred M.D.   On: 12/11/2020 23:59   CT Head Wo Contrast  Result Date: 12/11/2020 CLINICAL DATA:  Trauma. EXAM: CT HEAD WITHOUT CONTRAST CT CERVICAL SPINE WITHOUT CONTRAST TECHNIQUE: Multidetector CT imaging of the head and cervical spine was performed following the standard protocol without intravenous contrast. Multiplanar CT image reconstructions of the cervical spine were also generated. COMPARISON:  None. FINDINGS: CT HEAD FINDINGS Brain: There is minimal right temporal subarachnoid hemorrhage (12/3, 33/5). No mass effect or midline shift. There is mild age-related atrophy and chronic microvascular ischemic changes. [facility] Vascular: No hyperdense vessel or unexpected calcification. Skull: Normal. Negative for fracture or focal lesion. Sinuses/Orbits: No acute finding. Other: Left posterior parietal scalp laceration and hematoma. CT CERVICAL SPINE FINDINGS Alignment: No acute subluxation. There is straightening of normal cervical lordosis which may be positional or due to muscle spasm. Skull base and vertebrae: No acute fracture. Soft tissues and spinal canal: No prevertebral fluid or swelling. No visible canal hematoma. Disc levels:  Multilevel  degenerative changes Upper chest: Biapical subpleural scarring. Other: A 2 cm rim calcified right thyroid nodule present since 2005. Stability for greater than 5 years implies benignity; no biopsy or followup indicated (ref: J Am Coll Radiol. 2015 Feb;12(2): 143-50). IMPRESSION: 1. Minimal right temporal subarachnoid hemorrhage. No mass effect or midline shift. 2. No acute/traumatic cervical spine pathology. These results were called by telephone at the time of interpretation on 12/11/2020 at 9:57 pm to provider [facility] , who verbally acknowledged these results. Electronically Signed   By: Anner Crete M.D.   On: 12/11/2020 22:03   CT Cervical Spine Wo Contrast  Result Date: 12/11/2020 CLINICAL DATA:  Trauma. EXAM: CT HEAD WITHOUT CONTRAST CT CERVICAL SPINE WITHOUT CONTRAST TECHNIQUE: Multidetector CT imaging of the head and cervical spine was performed following the standard protocol without intravenous contrast. Multiplanar CT image reconstructions of the cervical spine were also generated. COMPARISON:  None. FINDINGS: CT HEAD FINDINGS Brain: There is minimal right temporal subarachnoid hemorrhage (12/3, 33/5). No mass effect or midline shift. There is mild age-related atrophy and chronic microvascular ischemic changes. [place]  Vascular: No hyperdense vessel or unexpected calcification. Skull: Normal. Negative for fracture or focal lesion. Sinuses/Orbits: No acute finding. Other: Left posterior parietal scalp laceration and hematoma. CT CERVICAL SPINE FINDINGS Alignment: No acute subluxation. There is straightening of normal cervical lordosis which may be positional or due to muscle spasm. Skull base and vertebrae: No acute fracture. Soft tissues and spinal canal: No prevertebral fluid or swelling. No visible canal hematoma. Disc levels:  Multilevel degenerative changes Upper chest: Biapical subpleural scarring. Other: A 2 cm rim calcified right thyroid nodule present since 2005. Stability for  greater than 5 years implies benignity; no biopsy or followup indicated (ref: J Am Coll Radiol. 2015 Feb;12(2): 143-50). IMPRESSION: 1. Minimal right temporal subarachnoid hemorrhage. No mass effect or midline shift. 2. No acute/traumatic cervical spine pathology. These results were called by telephone at the time of interpretation on 12/11/2020 at 9:57 pm to provider [facility] , who verbally acknowledged these results. Electronically Signed   By: Anner Crete M.D.   On: 12/11/2020 22:03    EKG: Personally reviewed.  Rhythm is normal sinus rhythm with heart rate of 65 bpm.  Evidence of right bundle branch block.  No dynamic ST segment changes appreciated.  Assessment/Plan  * Subarachnoid hemorrhage following injury with concussion Patient suffering from an extremely small subarachnoid hemorrhage status post mechanical fall and traumatic injury ER provider reviewed images and discussed case with Dr. Venetia Constable with neurosurgery who recommended that there was surgical intervention, no follow-up imaging and no need for outpatient follow-up and that patient could be discharged. Unfortunately, patient continued to feel quite weak with ongoing vertigo and intermittent headache with one episode of vomiting likely secondary to an associated postconcussive syndrome Considering patient's advanced age, treatment is conservative with as needed antiemetics and mild analgesics for associated symptoms PT evaluation ordered for the morning If patient exhibits any new neurologic findings we will consider new discussion with neurosurgery and repeat imaging   Essential hypertension Resume patients home regimen of oral antihypertensives Titrate antihypertensive regimen as necessary to achieve adequate BP control Typically in the setting of intracranial hemorrhage systolic blood pressure target is 140 however considering how mild the hemorrhage is there is no real need for this.  We will order a as needed  antihypertensive with a relaxed antihypertensive target of less than 160    GERD without esophagitis Continuing home regimen of daily PPI therapy.   Hypothyroidism Resume home regimen of Synthroid    Cardiomegaly Incidental finding of cardiomegaly or possible pericardial effusion on chest x-ray Considering patient's fall we will go ahead and obtain an echocardiogram to further evaluate Monitoring patient on telemetry      Code Status:  Full code  code status decision has been confirmed with: patient Family Communication: deferred   Status is: Observation  The patient remains OBS appropriate and will d/c before 2 midnights.       Vernelle Emerald MD [facility] Pager [phone]  If 7PM-7AM, please contact night-coverage [email or web address] Use universal [place] password for that web site. If you do not have the password, please call the hospital operator.  12/12/2020, 9:01 AM

## 2020-12-12 NOTE — ED Notes (Signed)
Admit MD paged at this time regarding pt bp. Adjusted and taken to both arms with same results. Waiting for MD to call back. Will continue to monitor. Remains alert and oriented x 4. Cardiac monitoring continued.

## 2020-12-12 NOTE — Assessment & Plan Note (Signed)
.   Resume home regimen of Synthroid 

## 2020-12-12 NOTE — ED Notes (Signed)
Admit team paged at this time regarding pt bp. Will continue to monitor. Pending orders from admit team.

## 2020-12-12 NOTE — ED Notes (Signed)
Amy Roessing daughter [phone] requesting an update on the patient

## 2020-12-12 NOTE — ED Notes (Signed)
Patient is resting comfortably. Repositioned in bed for  comfort. In NAD

## 2020-12-12 NOTE — Progress Notes (Addendum)
PROGRESS NOTE    Cheryl Stafford  NAT:[id number] DOB: December 14, 1929 DOA: 12/11/2020 PCP: Nicoletta Dress, MD  Brief Narrative: 89/F with history of type 2 diabetes mellitus, iron deficiency anemia, hypertension, hypothyroidism, GERD, hiatal hernia fell backwards while picking up a package, struck her head, had small laceration with bleeding from her scalp, was brought to [facility] ED. -CT head noted tiny right temporal SAH, EDP discussed with neurosurgery, no further work-up recommended. -Subsequently patient had dizziness, nausea and an episode of vomiting, TRH was consulted for admission, chest x-ray noted cardiomegaly   Assessment & Plan:   Principal Problem:   Subarachnoid hemorrhage following injury with concussion  -Following mechanical fall -case d/w Neurosurgery on admission, no workup or follow-up imaging recommended -Patient with vomiting, dizziness and postconcussion symptoms -add gentle IVF -Supportive care, PT OT eval -If symptoms persist will repeat imaging  Cardiomegaly -Noted on x-ray, no symptoms of CHF, pericardial effusion -Follow-up echo  Hypertension -Blood pressure soft, continue propranolol, hold amlodipine and ARB -gentle iVF today  Hypothyroidism -Continue Synthroid  DVT prophylaxis: SCDs Code Status: Full Code Family Communication: NO family at bedside Disposition Plan:  Status is: Observation   Consultants:  D/w neurosurgery on admission  Procedures:   Antimicrobials:    Subjective: -Reports dizziness when trying to get up, vomited last night, nauseous this morning  Objective: Vitals:   12/12/20 0715 12/12/20 0830 12/12/20 0945 12/12/20 1100  BP: 117/63 100/65 (!) 112/53 (!) 100/51  Pulse: 67 63 64 70  Resp: 19 (!) 22 20 17   Temp:      TempSrc:      SpO2: 98% 98% 100% 91%   No intake or output data in the 24 hours ending 12/12/20 1151 There were no vitals filed for this visit.  Examination:  General exam: Pleasant elderly female,  laying in bed, AAOx3, no distress HEENT: Large scab on her scalp CVS: S1-S2, regular rate rhythm, distant heart sounds Lungs: Decreased at the bases otherwise clear Abdomen: Soft, nontender, bowel sounds present Extremities: No edema Skin: No rash on exposed skin Psych: Appropriate mood and affect   Data Reviewed:   CBC: Recent Labs  Lab 12/11/20 0050 12/12/20 0802  WBC 13.1* 9.2  NEUTROABS 11.5* 6.7  HGB 11.7* 10.8*  HCT 35.5* 33.3*  MCV 94.2 95.1  PLT 229 427   Basic Metabolic Panel: Recent Labs  Lab 12/11/20 0050 12/12/20 0802  NA 133* 133*  K 3.9 4.0  CL 101 98  CO2 25 29  GLUCOSE 150* 114*  BUN 17 15  CREATININE 0.73 0.84  CALCIUM 8.8* 8.9  MG  --  2.4   GFR: CrCl cannot be calculated (Unknown ideal weight.). Liver Function Tests: Recent Labs  Lab 12/11/20 0050 12/12/20 0802  AST 22 19  ALT 15 14  ALKPHOS 46 39  BILITOT 0.5 0.8  PROT 6.1* 5.8*  ALBUMIN 3.4* 3.3*   No results for input(s): LIPASE, AMYLASE in the last 168 hours. No results for input(s): AMMONIA in the last 168 hours. Coagulation Profile: Recent Labs  Lab 12/11/20 0050  INR 1.1   Cardiac Enzymes: No results for input(s): CKTOTAL, CKMB, CKMBINDEX, TROPONINI in the last 168 hours. BNP (last 3 results) No results for input(s): PROBNP in the last 8760 hours. HbA1C: No results for input(s): HGBA1C in the last 72 hours. CBG: Recent Labs  Lab 12/12/20 0733 12/12/20 1141  GLUCAP 98 114*   Lipid Profile: No results for input(s): CHOL, HDL, LDLCALC, TRIG, CHOLHDL, LDLDIRECT in the last 72  hours. Thyroid Function Tests: No results for input(s): TSH, T4TOTAL, FREET4, T3FREE, THYROIDAB in the last 72 hours. Anemia Panel: No results for input(s): VITAMINB12, FOLATE, FERRITIN, TIBC, IRON, RETICCTPCT in the last 72 hours. Urine analysis: No results found for: COLORURINE, APPEARANCEUR, LABSPEC, PHURINE, GLUCOSEU, HGBUR, BILIRUBINUR, KETONESUR, PROTEINUR, UROBILINOGEN, NITRITE,  LEUKOCYTESUR Sepsis Labs: @LABRCNTIP (procalcitonin:4,lacticidven:4)  ) Recent Results (from the past 240 hour(s))  Resp Panel by RT-PCR (Flu A&B, Covid) Nasopharyngeal Swab     Status: None   Collection Time: 12/12/20  3:10 AM   Specimen: Nasopharyngeal Swab; Nasopharyngeal(NP) swabs in vial transport medium  Result Value Ref Range Status   SARS Coronavirus 2 by RT PCR NEGATIVE NEGATIVE Final    Comment: (NOTE) SARS-CoV-2 target nucleic acids are NOT DETECTED.  The SARS-CoV-2 RNA is generally detectable in upper respiratory specimens during the acute phase of infection. The lowest concentration of SARS-CoV-2 viral copies this assay can detect is 138 copies/mL. A negative result does not preclude SARS-Cov-2 infection and should not be used as the sole basis for treatment or other patient management decisions. A negative result may occur with  improper specimen collection/handling, submission of specimen other than nasopharyngeal swab, presence of viral mutation(s) within the areas targeted by this assay, and inadequate number of viral copies(<138 copies/mL). A negative result must be combined with clinical observations, patient history, and epidemiological information. The expected result is Negative.  Fact Sheet for Patients:  EntrepreneurPulse.com.au  Fact Sheet for Healthcare Providers:  IncredibleEmployment.be  This test is no t yet approved or cleared by the [place] FDA and  has been authorized for detection and/or diagnosis of SARS-CoV-2 by FDA under an Emergency Use Authorization (EUA). This EUA will remain  in effect (meaning this test can be used) for the duration of the COVID-19 declaration under Section 564(b)(1) of the Act, 21 U.S.C.section 360bbb-3(b)(1), unless the authorization is terminated  or revoked sooner.       Influenza A by PCR NEGATIVE NEGATIVE Final   Influenza B by PCR NEGATIVE NEGATIVE Final    Comment:  (NOTE) The Xpert Xpress SARS-CoV-2/FLU/RSV plus assay is intended as an aid in the diagnosis of influenza from Nasopharyngeal swab specimens and should not be used as a sole basis for treatment. Nasal washings and aspirates are unacceptable for Xpert Xpress SARS-CoV-2/FLU/RSV testing.  Fact Sheet for Patients: EntrepreneurPulse.com.au  Fact Sheet for Healthcare Providers: IncredibleEmployment.be  This test is not yet approved or cleared by the [place] FDA and has been authorized for detection and/or diagnosis of SARS-CoV-2 by FDA under an Emergency Use Authorization (EUA). This EUA will remain in effect (meaning this test can be used) for the duration of the COVID-19 declaration under Section 564(b)(1) of the Act, 21 U.S.C. section 360bbb-3(b)(1), unless the authorization is terminated or revoked.  Performed at [facility], [place] [address], [place], [place] [address]          Radiology Studies: DG Chest 1 View  Result Date: 12/11/2020 CLINICAL DATA:  Fall. EXAM: CHEST  1 VIEW COMPARISON:  Chest x-ray 08/14/2007.  CT PE protocol 04/27/2003. FINDINGS: The cardiac silhouette is moderately enlarged, a new finding. The lungs and costophrenic angles are clear. There is no pneumothorax. Peripherally calcified rounded density in the lower right neck measuring 2 cm is unchanged from prior examination corresponding to peripherally calcified right thyroid nodule. The osseous structures are within normal limits. IMPRESSION: 1. New moderate enlargement of the cardiac silhouette may represent cardiomegaly and/or pericardial effusion. 2. Lungs are clear.  Electronically Signed   By: Ronney Asters M.D.   On: 12/11/2020 23:59   DG Lumbar Spine Complete  Result Date: 12/11/2020 CLINICAL DATA:  Fall EXAM: LUMBAR SPINE - COMPLETE 4+ VIEW COMPARISON:  02/29/2016 FINDINGS: Dextroscoliosis. Vertebral body heights are maintained. Multilevel facet arthrosis.  IMPRESSION: No acute fracture or static subluxation of the lumbar spine. Marked right convex scoliosis. Electronically Signed   By: Ulyses Jarred M.D.   On: 12/11/2020 23:59   CT Head Wo Contrast  Result Date: 12/11/2020 CLINICAL DATA:  Trauma. EXAM: CT HEAD WITHOUT CONTRAST CT CERVICAL SPINE WITHOUT CONTRAST TECHNIQUE: Multidetector CT imaging of the head and cervical spine was performed following the standard protocol without intravenous contrast. Multiplanar CT image reconstructions of the cervical spine were also generated. COMPARISON:  None. FINDINGS: CT HEAD FINDINGS Brain: There is minimal right temporal subarachnoid hemorrhage (12/3, 33/5). No mass effect or midline shift. There is mild age-related atrophy and chronic microvascular ischemic changes. Forty Vascular: No hyperdense vessel or unexpected calcification. Skull: Normal. Negative for fracture or focal lesion. Sinuses/Orbits: No acute finding. Other: Left posterior parietal scalp laceration and hematoma. CT CERVICAL SPINE FINDINGS Alignment: No acute subluxation. There is straightening of normal cervical lordosis which may be positional or due to muscle spasm. Skull base and vertebrae: No acute fracture. Soft tissues and spinal canal: No prevertebral fluid or swelling. No visible canal hematoma. Disc levels:  Multilevel degenerative changes Upper chest: Biapical subpleural scarring. Other: A 2 cm rim calcified right thyroid nodule present since 2005. Stability for greater than 5 years implies benignity; no biopsy or followup indicated (ref: J Am Coll Radiol. 2015 Feb;12(2): 143-50). IMPRESSION: 1. Minimal right temporal subarachnoid hemorrhage. No mass effect or midline shift. 2. No acute/traumatic cervical spine pathology. These results were called by telephone at the time of interpretation on 12/11/2020 at 9:57 pm to provider [facility] , who verbally acknowledged these results. Electronically Signed   By: Anner Crete M.D.   On:  12/11/2020 22:03   CT Cervical Spine Wo Contrast  Result Date: 12/11/2020 CLINICAL DATA:  Trauma. EXAM: CT HEAD WITHOUT CONTRAST CT CERVICAL SPINE WITHOUT CONTRAST TECHNIQUE: Multidetector CT imaging of the head and cervical spine was performed following the standard protocol without intravenous contrast. Multiplanar CT image reconstructions of the cervical spine were also generated. COMPARISON:  None. FINDINGS: CT HEAD FINDINGS Brain: There is minimal right temporal subarachnoid hemorrhage (12/3, 33/5). No mass effect or midline shift. There is mild age-related atrophy and chronic microvascular ischemic changes. Forty Vascular: No hyperdense vessel or unexpected calcification. Skull: Normal. Negative for fracture or focal lesion. Sinuses/Orbits: No acute finding. Other: Left posterior parietal scalp laceration and hematoma. CT CERVICAL SPINE FINDINGS Alignment: No acute subluxation. There is straightening of normal cervical lordosis which may be positional or due to muscle spasm. Skull base and vertebrae: No acute fracture. Soft tissues and spinal canal: No prevertebral fluid or swelling. No visible canal hematoma. Disc levels:  Multilevel degenerative changes Upper chest: Biapical subpleural scarring. Other: A 2 cm rim calcified right thyroid nodule present since 2005. Stability for greater than 5 years implies benignity; no biopsy or followup indicated (ref: J Am Coll Radiol. 2015 Feb;12(2): 143-50). IMPRESSION: 1. Minimal right temporal subarachnoid hemorrhage. No mass effect or midline shift. 2. No acute/traumatic cervical spine pathology. These results were called by telephone at the time of interpretation on 12/11/2020 at 9:57 pm to provider [facility] , who verbally acknowledged these results. Electronically Signed   By: Anner Crete  M.D.   On: 12/11/2020 22:03        Scheduled Meds:  amLODipine  5 mg Oral Daily   And   irbesartan  300 mg Oral Daily   escitalopram  20 mg Oral Daily    gabapentin  300 mg Oral Daily   insulin aspart  0-15 Units Subcutaneous TID AC & HS   levothyroxine  137 mcg Oral QAC breakfast   ondansetron (ZOFRAN) IV  4 mg Intravenous Once   pantoprazole  40 mg Oral Daily   pramipexole  0.25 mg Oral QHS   propranolol  10 mg Oral BID   Continuous Infusions:   LOS: 0 days    Time spent: 41min    Domenic Polite, MD [facility]   12/12/2020, 11:51 AM

## 2020-12-12 NOTE — Assessment & Plan Note (Addendum)
   Patient suffering from an extremely small subarachnoid hemorrhage status post mechanical fall and traumatic injury  ER provider reviewed images and discussed case with Dr. Venetia Constable with neurosurgery who recommended that there was surgical intervention, no follow-up imaging and no need for outpatient follow-up and that patient could be discharged.  Unfortunately, patient continued to feel quite weak with ongoing vertigo and intermittent headache with one episode of vomiting likely secondary to an associated postconcussive syndrome  Considering patient's advanced age, treatment is conservative with as needed antiemetics and mild analgesics for associated symptoms  PT evaluation ordered for the morning  If patient exhibits any new neurologic findings we will consider new discussion with neurosurgery and repeat imaging

## 2020-12-12 NOTE — ED Notes (Signed)
Pt sitting up in stretcher eating breakfast. Denies needs. Reports back pain from stretcher but declines further intervention. Reports dizziness on repositioning in stretcher. Call light in reach. VSS, no distress noted.

## 2020-12-12 NOTE — Assessment & Plan Note (Signed)
   Incidental finding of cardiomegaly or possible pericardial effusion on chest x-ray  Considering patient's fall we will go ahead and obtain an echocardiogram to further evaluate  Monitoring patient on telemetry

## 2020-12-12 NOTE — Assessment & Plan Note (Addendum)
.   Resume patients home regimen of oral antihypertensives . Titrate antihypertensive regimen as necessary to achieve adequate BP control . Typically in the setting of intracranial hemorrhage systolic blood pressure target is 140 however considering how mild the hemorrhage is there is no real need for this.  We will order a as needed antihypertensive with a relaxed antihypertensive target of less than 160

## 2020-12-12 NOTE — ED Notes (Signed)
Admitting paged to RN per her request 

## 2020-12-12 NOTE — ED Notes (Signed)
Patient is resting comfortably. 

## 2020-12-12 NOTE — ED Notes (Signed)
Pt sleeping. Daughter called and given update.  Pt still waiting on bed and has been in NAD.

## 2020-12-12 NOTE — TOC CAGE-AID Note (Signed)
Transition of Care [facility]) - CAGE-AID Screening   Patient Details  Name: Khylei Wilms MRN: [id number] Date of Birth: 05/20/1929  Transition of Care [facility]) CM/SW Contact:    Brayln Duque C Tarpley-Carter, [place] Phone Number: 12/12/2020, 11:22 AM   Clinical Narrative: Pt participated in [place].  Pt stated she does not use substance or ETOH.  Pt was not offered resources, due to no usage of substance or ETOH.     Andras Grunewald Tarpley-Carter, MSW, LCSW-A Pronouns:  She/Her/Hers [facility]itions of Care Clinical Social Worker Direct Number:  [phone] [email or web address]   CAGE-AID Screening:    Have You Ever Felt You Ought to Cut Down on Your Drinking or Drug Use?: No Have People Annoyed You By [employer] Your Drinking Or Drug Use?: No Have You Felt Bad Or Guilty About Your Drinking Or Drug Use?: No Have You Ever Had a Drink or Used Drugs First Thing In The Morning to Steady Your Nerves or to Get Rid of a Hangover?: No CAGE-AID Score: 0  Substance Abuse Education Offered: No

## 2020-12-12 NOTE — Assessment & Plan Note (Signed)
Continuing home regimen of daily PPI therapy.  

## 2020-12-12 NOTE — ED Notes (Signed)
MD paged twice at this time and messaged on epic for further orders. Pt bp is 71/44 at this time. Remains alert and oriented x 4. Cardiac monitoring in place. Pending new orders.

## 2020-12-12 NOTE — Progress Notes (Signed)
  Echocardiogram 2D Echocardiogram has been performed.  Fidel Levy 12/12/2020, 10:28 AM

## 2020-12-12 NOTE — ED Notes (Signed)
FAMILY CONTACT: JULIE (DAUGHTER) [phone]

## 2020-12-12 NOTE — ED Notes (Signed)
Attempted to give reportx1 

## 2020-12-12 NOTE — ED Notes (Signed)
Pt back from xray via stretcher and in NAD

## 2020-12-12 NOTE — ED Notes (Signed)
Admit team paged d/t pt's bp. 85/53. Verbal order given for 567ml bolus NS

## 2020-12-12 NOTE — ED Notes (Signed)
Pt continues to endorse headache and dizziness, Tylenol given. Remains alert and oriented x 4. Will notify MD.

## 2020-12-13 ENCOUNTER — Telehealth: Payer: Self-pay

## 2020-12-13 ENCOUNTER — Other Ambulatory Visit: Payer: Self-pay

## 2020-12-13 ENCOUNTER — Inpatient Hospital Stay (HOSPITAL_COMMUNITY): Payer: Medicare Other

## 2020-12-13 ENCOUNTER — Other Ambulatory Visit (HOSPITAL_COMMUNITY): Payer: Medicare Other

## 2020-12-13 DIAGNOSIS — I517 Cardiomegaly: Secondary | ICD-10-CM

## 2020-12-13 DIAGNOSIS — S066X0D Traumatic subarachnoid hemorrhage without loss of consciousness, subsequent encounter: Secondary | ICD-10-CM

## 2020-12-13 DIAGNOSIS — R1310 Dysphagia, unspecified: Secondary | ICD-10-CM

## 2020-12-13 DIAGNOSIS — K219 Gastro-esophageal reflux disease without esophagitis: Secondary | ICD-10-CM

## 2020-12-13 DIAGNOSIS — I10 Essential (primary) hypertension: Secondary | ICD-10-CM

## 2020-12-13 LAB — CBC WITH DIFFERENTIAL/PLATELET
Abs Immature Granulocytes: 0.02 10*3/uL (ref 0.00–0.07)
Basophils Absolute: 0.1 10*3/uL (ref 0.0–0.1)
Basophils Relative: 1 %
Eosinophils Absolute: 0.1 10*3/uL (ref 0.0–0.5)
Eosinophils Relative: 1 %
HCT: 30 % — ABNORMAL LOW (ref 36.0–46.0)
Hemoglobin: 9.6 g/dL — ABNORMAL LOW (ref 12.0–15.0)
Immature Granulocytes: 0 %
Lymphocytes Relative: 26 %
Lymphs Abs: 2.2 10*3/uL (ref 0.7–4.0)
MCH: 31.1 pg (ref 26.0–34.0)
MCHC: 32 g/dL (ref 30.0–36.0)
MCV: 97.1 fL (ref 80.0–100.0)
Monocytes Absolute: 0.8 10*3/uL (ref 0.1–1.0)
Monocytes Relative: 9 %
Neutro Abs: 5.3 10*3/uL (ref 1.7–7.7)
Neutrophils Relative %: 63 %
Platelets: 180 10*3/uL (ref 150–400)
RBC: 3.09 MIL/uL — ABNORMAL LOW (ref 3.87–5.11)
RDW: 13.1 % (ref 11.5–15.5)
WBC: 8.4 10*3/uL (ref 4.0–10.5)
nRBC: 0 % (ref 0.0–0.2)

## 2020-12-13 LAB — COMPREHENSIVE METABOLIC PANEL
ALT: 9 U/L (ref 0–44)
AST: 13 U/L — ABNORMAL LOW (ref 15–41)
Albumin: 2.5 g/dL — ABNORMAL LOW (ref 3.5–5.0)
Alkaline Phosphatase: 34 U/L — ABNORMAL LOW (ref 38–126)
Anion gap: 5 (ref 5–15)
BUN: 16 mg/dL (ref 8–23)
CO2: 22 mmol/L (ref 22–32)
Calcium: 8.1 mg/dL — ABNORMAL LOW (ref 8.9–10.3)
Chloride: 107 mmol/L (ref 98–111)
Creatinine, Ser: 0.75 mg/dL (ref 0.44–1.00)
GFR, Estimated: >60 mL/min (ref >60)
Glucose, Bld: 107 mg/dL — ABNORMAL HIGH (ref 70–99)
Potassium: 4.1 mmol/L (ref 3.5–5.1)
Sodium: 134 mmol/L — ABNORMAL LOW (ref 135–145)
Total Bilirubin: 0.5 mg/dL (ref 0.3–1.2)
Total Protein: 4.9 g/dL — ABNORMAL LOW (ref 6.5–8.1)

## 2020-12-13 LAB — MRSA NEXT GEN BY PCR, NASAL: MRSA by PCR Next Gen: NOT DETECTED

## 2020-12-13 LAB — HEMOGLOBIN A1C
Hgb A1c MFr Bld: 5.6 % (ref 4.8–5.6)
Mean Plasma Glucose: 114.02 mg/dL

## 2020-12-13 LAB — GLUCOSE, CAPILLARY
Glucose-Capillary: 135 mg/dL — ABNORMAL HIGH (ref 70–99)
Glucose-Capillary: 120 mg/dL — ABNORMAL HIGH (ref 70–99)
Glucose-Capillary: 105 mg/dL — ABNORMAL HIGH (ref 70–99)

## 2020-12-13 LAB — MAGNESIUM: Magnesium: 2 mg/dL (ref 1.7–2.4)

## 2020-12-13 MED ORDER — KETOROLAC TROMETHAMINE 15 MG/ML IJ SOLN
7.5000 mg | Freq: Once | INTRAMUSCULAR | Status: AC
  Administered 2020-12-13: 7.5 mg via INTRAVENOUS
  Filled 2020-12-13: qty 1

## 2020-12-13 NOTE — Evaluation (Signed)
Physical Therapy Evaluation Patient Details Name: Cheryl Stafford MRN: [id number] DOB: April 25, 1929 Today's Date: 12/13/2020  History of Present Illness  87 y.o. female presents to [facility] hospital on 12/11/2020 after fall at home. Pt found to have R temporal SAH. PMH includes DMII, HTN, hypothyroidism, GERD, hiatal hernia.  Clinical Impression  Pt presents to PT with deficits in strength, power, gait, functional mobility, endurance, and with dizziness. Pt is limited by reports of pain in L thorax as well as intermittent dizziness. Pt currently benefits from physical assistance to complete transfers and UE support of walker to improve stability when ambulating. Pt reports 24/7 support of family upon return home. PT recommends discharge home with HHPT, RW, and assistance for all out of bed mobility.       Recommendations for follow up therapy are one component of a multi-disciplinary discharge planning process, led by the attending physician.  Recommendations may be updated based on patient status, additional functional criteria and insurance authorization.  Follow Up Recommendations Home health PT    Assistance Recommended at Discharge Intermittent Supervision/Assistance (for out of bed mobility)  Functional Status Assessment Patient has had a recent decline in their functional status and demonstrates the ability to make significant improvements in function in a reasonable and predictable amount of time.  Equipment Recommendations  Rolling walker (2 wheels)    Recommendations for Other Services       Precautions / Restrictions Precautions Precautions: Fall Precaution Comments: history of falls Restrictions Weight Bearing Restrictions: No      Mobility  Bed Mobility Overal bed mobility: Needs Assistance Bed Mobility: Rolling;Sidelying to Sit Rolling: Min guard Sidelying to sit: HOB elevated;Min assist       General bed mobility comments: significant increase in time     Transfers Overall transfer level: Needs assistance Equipment used: Rolling walker (2 wheels);1 person hand held assist Transfers: Sit to/from Stand;Bed to chair/wheelchair/BSC Sit to Stand: Min assist   Step pivot transfers: Min assist            Ambulation/Gait Ambulation/Gait assistance: Min guard Gait Distance (Feet): 25 Feet Assistive device: Rolling walker (2 wheels) Gait Pattern/deviations: Step-through pattern Gait velocity: reduced Gait velocity interpretation: <1.31 ft/sec, indicative of household ambulator   General Gait Details: pt with slowed step-through gait  Stairs            Wheelchair Mobility    Modified Rankin (Stroke Patients Only) Modified Rankin (Stroke Patients Only) Pre-Morbid Rankin Score: Slight disability Modified Rankin: Moderately severe disability     Balance Overall balance assessment: Needs assistance Sitting-balance support: No upper extremity supported;Feet supported Sitting balance-Leahy Scale: Fair     Standing balance support: Single extremity supported;Bilateral upper extremity supported Standing balance-Leahy Scale: Poor Standing balance comment: minG with BUE support of walker, minA with unilateral hand hold                             Pertinent Vitals/Pain Pain Assessment: Faces Faces Pain Scale: Hurts whole lot Pain Location: L thorax Pain Descriptors / Indicators: Sore;Sharp Pain Intervention(s): Monitored during session    Home Living Family/patient expects to be discharged to:: Private residence Living Arrangements: Alone Available Help at Discharge: Family;Available 24 hours/day Type of Home: House Home Access: Stairs to enter Entrance Stairs-Rails: Left Entrance Stairs-Number of Steps: 3   Home Layout: One level Home Equipment: Cane - single point;Shower seat Additional Comments: plan to D/C to daughters home, level entrance    Prior  Function Prior Level of Function :  Independent/Modified Independent;History of Falls (last six months)             Mobility Comments: pt ambulates with use of cane, reports 4 falls in last 6 months, most occuring outdoors       Hand Dominance   Dominant Hand: Right    Extremity/Trunk Assessment   Upper Extremity Assessment Upper Extremity Assessment: LUE deficits/detail LUE Deficits / Details: generalized weakness, likely resulting from pain in L rib cage    Lower Extremity Assessment Lower Extremity Assessment: Generalized weakness    Cervical / Trunk Assessment Cervical / Trunk Assessment: Other exceptions;Kyphotic (pt with scoliosis)  Communication   Communication: No difficulties  Cognition Arousal/Alertness: Awake/alert Behavior During Therapy: WFL for tasks assessed/performed Overall Cognitive Status: Within Functional Limits for tasks assessed                                          General Comments General comments (skin integrity, edema, etc.): pt on 2L [place] upon PT arrival, PT weans to room air with desat to high 80s. Pt denies SOB, pleth wavelength with significant variability. Pt reports dizziness with initiation of rolling and intermittently during session. With vestibular testing pt reports no dizziness with pursuits, VOR. PT describes dizziness as room spinning. Later in session reports dizziness with horizontal head turns.    Exercises     Assessment/Plan    PT Assessment Patient needs continued PT services  PT Problem List Decreased strength;Decreased activity tolerance;Decreased balance;Decreased mobility;Decreased knowledge of use of DME;Decreased safety awareness;Pain;Cardiopulmonary status limiting activity       PT Treatment Interventions DME instruction;Gait training;Stair training;Functional mobility training;Therapeutic activities;Balance training;Therapeutic exercise;Neuromuscular re-education;Patient/family education    PT Goals (Current goals can be found  in the Care Plan section)  Acute Rehab PT Goals Patient Stated Goal: to go home with family assistance PT Goal Formulation: With patient Time For Goal Achievement: 12/27/20 Potential to Achieve Goals: Good    Frequency Min 4X/week   Barriers to discharge        Co-evaluation               AM-PAC PT "6 Clicks" Mobility  Outcome Measure Help needed turning from your back to your side while in a flat bed without using bedrails?: A Little Help needed moving from lying on your back to sitting on the side of a flat bed without using bedrails?: A Little Help needed moving to and from a bed to a chair (including a wheelchair)?: A Little Help needed standing up from a chair using your arms (e.g., wheelchair or bedside chair)?: A Little Help needed to walk in hospital room?: A Little Help needed climbing 3-5 steps with a railing? : A Lot 6 Click Score: 17    End of Session   Activity Tolerance: Patient limited by pain Patient left: in chair;with chair alarm set;with call bell/phone within reach Nurse Communication: Mobility status PT Visit Diagnosis: Unsteadiness on feet (R26.81);History of falling (Z91.81)    Time: 9163-8466 PT Time Calculation (min) (ACUTE ONLY): 35 min   Charges:   PT Evaluation $PT Eval Low Complexity: 1 Low PT Treatments $Gait Training: 8-22 mins        Zenaida Niece, PT, DPT Acute Rehabilitation Pager: [phone] Office [phone]   Zenaida Niece 12/13/2020, 10:14 AM

## 2020-12-13 NOTE — Progress Notes (Signed)
PROGRESS NOTE    Cheryl Stafford  VPX:[id number] DOB: 1929/03/05 DOA: 12/11/2020 PCP: Nicoletta Dress, MD  Brief Narrative: [name] with history of type 2 diabetes mellitus, iron deficiency anemia, hypertension, hypothyroidism, GERD, hiatal hernia fell backwards while picking up a package, struck her head, had small laceration with bleeding from her scalp, was brought to [facility] ED. -CT head noted tiny right temporal SAH, EDP discussed with neurosurgery, no further work-up recommended. -Subsequently patient had dizziness, nausea and an episode of vomiting, [facility] was consulted for admission, chest x-ray noted cardiomegaly   Assessment & Plan:   Subarachnoid hemorrhage following injury with concussion  -Following mechanical fall -case d/w Neurosurgery on admission, no workup or follow-up imaging recommended -Postconcussion symptoms improving, discontinue IV fluids today -Continue supportive care, Tylenol  Left lateral chest wall pain -Following mechanical fall, check x-ray rib series -Toradol, supportive care  Cardiomegaly -Noted on x-ray, no symptoms of CHF, pericardial effusion -2D echo noted preserved EF, grade 1 diastolic dysfunction , no significant valvular disease, no pericardial effusion  Hypertension -Blood pressure soft, continue propranolol, hold amlodipine and ARB -Blood pressure is more stable today  Hypothyroidism -Continue Synthroid  DVT prophylaxis: SCDs Code Status: Full Code Family Communication: NO family at bedside, updated daughter yesterday Disposition Plan:  Status is: Inpatient   Consultants:  D/w neurosurgery on admission  Procedures:   Antimicrobials:    Subjective: -Complains of severe chest wall pain this morning  Objective: Vitals:   12/13/20 0648 12/13/20 0751 12/13/20 1127 12/13/20 1155  BP:  (!) 111/54 (!) 99/50 (!) 95/55  Pulse:  62 (!) 52 (!) 56  Resp:  16 18 19   Temp:  98.6 F (37 C) 98.6 F (37 C)   TempSrc:  Oral Oral   SpO2:   97% 96% (!) 84%  Weight: 59.1 kg       Intake/Output Summary (Last 24 hours) at 12/13/2020 1453 Last data filed at 12/13/2020 1015 Gross per 24 hour  Intake 740 ml  Output 400 ml  Net 340 ml   Filed Weights   12/13/20 0648  Weight: 59.1 kg    Examination:  General exam: Pleasant elderly female sitting up in bed, AAOx3, uncomfortable appearing HEENT: Scab on her scalp, no active bleeding now CVS: S1-S2, regular rhythm, distant heart sounds Lungs: Decreased breath sounds to bases, left lateral chest wall tenderness Abdomen: Soft, nontender, bowel sounds present Extremities: No edema  Skin: No rash on exposed skin Psych: Appropriate mood and affect   Data Reviewed:   CBC: Recent Labs  Lab 12/11/20 0050 12/12/20 0802 12/13/20 0339  WBC 13.1* 9.2 8.4  NEUTROABS 11.5* 6.7 5.3  HGB 11.7* 10.8* 9.6*  HCT 35.5* 33.3* 30.0*  MCV 94.2 95.1 97.1  PLT 229 218 462   Basic Metabolic Panel: Recent Labs  Lab 12/11/20 0050 12/12/20 0802 12/13/20 0339  NA 133* 133* 134*  K 3.9 4.0 4.1  CL 101 98 107  CO2 25 29 22   GLUCOSE 150* 114* 107*  BUN 17 15 16   CREATININE 0.73 0.84 0.75  CALCIUM 8.8* 8.9 8.1*  MG  --  2.4 2.0   GFR: Estimated Creatinine Clearance: 40.2 mL/min (by C-G formula based on SCr of 0.75 mg/dL). Liver Function Tests: Recent Labs  Lab 12/11/20 0050 12/12/20 0802 12/13/20 0339  AST 22 19 13*  ALT 15 14 9   ALKPHOS 46 39 34*  BILITOT 0.5 0.8 0.5  PROT 6.1* 5.8* 4.9*  ALBUMIN 3.4* 3.3* 2.5*   No results for input(s):  LIPASE, AMYLASE in the last 168 hours. No results for input(s): AMMONIA in the last 168 hours. Coagulation Profile: Recent Labs  Lab 12/11/20 0050  INR 1.1   Cardiac Enzymes: No results for input(s): CKTOTAL, CKMB, CKMBINDEX, TROPONINI in the last 168 hours. BNP (last 3 results) No results for input(s): PROBNP in the last 8760 hours. HbA1C: Recent Labs    12/13/20 0339  HGBA1C 5.6   CBG: Recent Labs  Lab 12/12/20 1141  12/12/20 1617 12/12/20 2235 12/13/20 0750 12/13/20 1125  GLUCAP 114* 131* 120* 135* 120*   Lipid Profile: No results for input(s): CHOL, HDL, LDLCALC, TRIG, CHOLHDL, LDLDIRECT in the last 72 hours. Thyroid Function Tests: No results for input(s): TSH, T4TOTAL, FREET4, T3FREE, THYROIDAB in the last 72 hours. Anemia Panel: No results for input(s): VITAMINB12, FOLATE, FERRITIN, TIBC, IRON, RETICCTPCT in the last 72 hours. Urine analysis:    Component Value Date/Time   COLORURINE YELLOW 12/12/2020 [place] 12/12/2020 0644   LABSPEC 1.015 12/12/2020 0644   PHURINE 6.5 12/12/2020 [place] 12/12/2020 0644   HGBUR NEGATIVE 12/12/2020 0644   BILIRUBINUR NEGATIVE 12/12/2020 0644   KETONESUR NEGATIVE 12/12/2020 0644   PROTEINUR NEGATIVE 12/12/2020 0644   NITRITE NEGATIVE 12/12/2020 0644   LEUKOCYTESUR NEGATIVE 12/12/2020 0644   Sepsis Labs: @LABRCNTIP (procalcitonin:4,lacticidven:4)  ) Recent Results (from the past 240 hour(s))  Resp Panel by RT-PCR (Flu A&B, Covid) Nasopharyngeal Swab     Status: None   Collection Time: 12/12/20  3:10 AM   Specimen: Nasopharyngeal Swab; Nasopharyngeal(NP) swabs in vial transport medium  Result Value Ref Range Status   SARS Coronavirus 2 by RT PCR NEGATIVE NEGATIVE Final    Comment: (NOTE) SARS-CoV-2 target nucleic acids are NOT DETECTED.  The SARS-CoV-2 RNA is generally detectable in upper respiratory specimens during the acute phase of infection. The lowest concentration of SARS-CoV-2 viral copies this assay can detect is 138 copies/mL. A negative result does not preclude SARS-Cov-2 infection and should not be used as the sole basis for treatment or other patient management decisions. A negative result may occur with  improper specimen collection/handling, submission of specimen other than nasopharyngeal swab, presence of viral mutation(s) within the areas targeted by this assay, and inadequate number of  viral copies(<138 copies/mL). A negative result must be combined with clinical observations, patient history, and epidemiological information. The expected result is Negative.  Fact Sheet for Patients:  EntrepreneurPulse.com.au  Fact Sheet for Healthcare Providers:  IncredibleEmployment.be  This test is no t yet approved or cleared by the [place] FDA and  has been authorized for detection and/or diagnosis of SARS-CoV-2 by FDA under an Emergency Use Authorization (EUA). This EUA will remain  in effect (meaning this test can be used) for the duration of the COVID-19 declaration under Section 564(b)(1) of the Act, 21 U.S.C.section 360bbb-3(b)(1), unless the authorization is terminated  or revoked sooner.       Influenza A by PCR NEGATIVE NEGATIVE Final   Influenza B by PCR NEGATIVE NEGATIVE Final    Comment: (NOTE) The Xpert Xpress SARS-CoV-2/FLU/RSV plus assay is intended as an aid in the diagnosis of influenza from Nasopharyngeal swab specimens and should not be used as a sole basis for treatment. Nasal washings and aspirates are unacceptable for Xpert Xpress SARS-CoV-2/FLU/RSV testing.  Fact Sheet for Patients: EntrepreneurPulse.com.au  Fact Sheet for Healthcare Providers: IncredibleEmployment.be  This test is not yet approved or cleared by the [place] FDA and has been authorized for detection and/or  diagnosis of SARS-CoV-2 by FDA under an Emergency Use Authorization (EUA). This EUA will remain in effect (meaning this test can be used) for the duration of the COVID-19 declaration under Section 564(b)(1) of the Act, 21 U.S.C. section 360bbb-3(b)(1), unless the authorization is terminated or revoked.  Performed at [facility], [place] [address] [place], [place] [address]   MRSA Next Gen by PCR, Nasal     Status: None   Collection Time: 12/12/20  9:11 PM   Specimen: Nasal Mucosa; Nasal  Swab  Result Value Ref Range Status   MRSA by PCR Next Gen NOT DETECTED NOT DETECTED Final    Comment: (NOTE) The GeneXpert MRSA Assay (FDA approved for NASAL specimens only), is one component of a comprehensive MRSA colonization surveillance program. It is not intended to diagnose MRSA infection nor to guide or monitor treatment for MRSA infections. Test performance is not FDA approved in patients less than 62 years old. Performed at [facility], [place] [address] [place], [place] [address]          Radiology Studies: DG Chest 1 View  Result Date: 12/11/2020 CLINICAL DATA:  Fall. EXAM: CHEST  1 VIEW COMPARISON:  Chest x-ray 08/14/2007.  CT PE protocol 04/27/2003. FINDINGS: The cardiac silhouette is moderately enlarged, a new finding. The lungs and costophrenic angles are clear. There is no pneumothorax. Peripherally calcified rounded density in the lower right neck measuring 2 cm is unchanged from prior examination corresponding to peripherally calcified right thyroid nodule. The osseous structures are within normal limits. IMPRESSION: 1. New moderate enlargement of the cardiac silhouette may represent cardiomegaly and/or pericardial effusion. 2. Lungs are clear. Electronically Signed   By: Ronney Asters M.D.   On: 12/11/2020 23:59   DG Lumbar Spine Complete  Result Date: 12/11/2020 CLINICAL DATA:  Fall EXAM: LUMBAR SPINE - COMPLETE 4+ VIEW COMPARISON:  02/29/2016 FINDINGS: Dextroscoliosis. Vertebral body heights are maintained. Multilevel facet arthrosis. IMPRESSION: No acute fracture or static subluxation of the lumbar spine. Marked right convex scoliosis. Electronically Signed   By: Ulyses Jarred M.D.   On: 12/11/2020 23:59   CT Head Wo Contrast  Result Date: 12/11/2020 CLINICAL DATA:  Trauma. EXAM: CT HEAD WITHOUT CONTRAST CT CERVICAL SPINE WITHOUT CONTRAST TECHNIQUE: Multidetector CT imaging of the head and cervical spine was performed following the standard protocol without  intravenous contrast. Multiplanar CT image reconstructions of the cervical spine were also generated. COMPARISON:  None. FINDINGS: CT HEAD FINDINGS Brain: There is minimal right temporal subarachnoid hemorrhage (12/3, 33/5). No mass effect or midline shift. There is mild age-related atrophy and chronic microvascular ischemic changes. Forty Vascular: No hyperdense vessel or unexpected calcification. Skull: Normal. Negative for fracture or focal lesion. Sinuses/Orbits: No acute finding. Other: Left posterior parietal scalp laceration and hematoma. CT CERVICAL SPINE FINDINGS Alignment: No acute subluxation. There is straightening of normal cervical lordosis which may be positional or due to muscle spasm. Skull base and vertebrae: No acute fracture. Soft tissues and spinal canal: No prevertebral fluid or swelling. No visible canal hematoma. Disc levels:  Multilevel degenerative changes Upper chest: Biapical subpleural scarring. Other: A 2 cm rim calcified right thyroid nodule present since 2005. Stability for greater than 5 years implies benignity; no biopsy or followup indicated (ref: J Am Coll Radiol. 2015 Feb;12(2): 143-50). IMPRESSION: 1. Minimal right temporal subarachnoid hemorrhage. No mass effect or midline shift. 2. No acute/traumatic cervical spine pathology. These results were called by telephone at the time of interpretation on 12/11/2020  at 9:57 pm to provider Charmaine Downs , who verbally acknowledged these results. Electronically Signed   By: Anner Crete M.D.   On: 12/11/2020 22:03   CT Cervical Spine Wo Contrast  Result Date: 12/11/2020 CLINICAL DATA:  Trauma. EXAM: CT HEAD WITHOUT CONTRAST CT CERVICAL SPINE WITHOUT CONTRAST TECHNIQUE: Multidetector CT imaging of the head and cervical spine was performed following the standard protocol without intravenous contrast. Multiplanar CT image reconstructions of the cervical spine were also generated. COMPARISON:  None. FINDINGS: CT HEAD FINDINGS  Brain: There is minimal right temporal subarachnoid hemorrhage (12/3, 33/5). No mass effect or midline shift. There is mild age-related atrophy and chronic microvascular ischemic changes. Forty Vascular: No hyperdense vessel or unexpected calcification. Skull: Normal. Negative for fracture or focal lesion. Sinuses/Orbits: No acute finding. Other: Left posterior parietal scalp laceration and hematoma. CT CERVICAL SPINE FINDINGS Alignment: No acute subluxation. There is straightening of normal cervical lordosis which may be positional or due to muscle spasm. Skull base and vertebrae: No acute fracture. Soft tissues and spinal canal: No prevertebral fluid or swelling. No visible canal hematoma. Disc levels:  Multilevel degenerative changes Upper chest: Biapical subpleural scarring. Other: A 2 cm rim calcified right thyroid nodule present since 2005. Stability for greater than 5 years implies benignity; no biopsy or followup indicated (ref: J Am Coll Radiol. 2015 Feb;12(2): 143-50). IMPRESSION: 1. Minimal right temporal subarachnoid hemorrhage. No mass effect or midline shift. 2. No acute/traumatic cervical spine pathology. These results were called by telephone at the time of interpretation on 12/11/2020 at 9:57 pm to provider [facility] , who verbally acknowledged these results. Electronically Signed   By: Anner Crete M.D.   On: 12/11/2020 22:03   ECHOCARDIOGRAM COMPLETE  Result Date: 12/12/2020    ECHOCARDIOGRAM REPORT   Patient Name:   Cheryl Stafford Date of Exam: 12/12/2020 Medical Rec #:  [id number]    Height:       63.0 in Accession #:    [id number]   Weight:       125.2 lb Date of Birth:  07/26/1929   BSA:          1.585 m Patient Age:    5 years     BP:           117/63 mmHg Patient Gender: F            HR:           74 bpm. Exam Location:  Inpatient Procedure: 2D Echo, Cardiac Doppler and Color Doppler Indications:    Pericardial Effusion  History:        Patient has no prior history of  Echocardiogram examinations.                 Risk Factors:Hypertension.  Sonographer:    Bernadene Person RDCS Referring Phys: [id number] [place]  1. Left ventricular ejection fraction, by estimation, is 60 to 65%. The left ventricle has normal function. The left ventricle has no regional wall motion abnormalities. Left ventricular diastolic parameters are consistent with Grade I diastolic dysfunction (impaired relaxation).  2. Right ventricular systolic function is normal. The right ventricular size is normal. There is normal pulmonary artery systolic pressure.  3. The mitral valve is normal in structure. Mild mitral valve regurgitation. No evidence of mitral stenosis.  4. The aortic valve is tricuspid. Aortic valve regurgitation is mild. No aortic stenosis is present.  5. The inferior vena cava is normal in size with greater than 50%  respiratory variability, suggesting right atrial pressure of 3 mmHg. Comparison(s): No prior Echocardiogram. FINDINGS  Left Ventricle: Left ventricular ejection fraction, by estimation, is 60 to 65%. The left ventricle has normal function. The left ventricle has no regional wall motion abnormalities. The left ventricular internal cavity size was normal in size. There is  no left ventricular hypertrophy. Left ventricular diastolic parameters are consistent with Grade I diastolic dysfunction (impaired relaxation). Right Ventricle: The right ventricular size is normal. Right ventricular systolic function is normal. There is normal pulmonary artery systolic pressure. The tricuspid regurgitant velocity is 2.63 m/s, and with an assumed right atrial pressure of 3 mmHg,  the estimated right ventricular systolic pressure is 50.5 mmHg. Left Atrium: Left atrial size was normal in size. Right Atrium: Right atrial size was normal in size. Pericardium: There is no evidence of pericardial effusion. Mitral Valve: The mitral valve is normal in structure. Mild mitral valve  regurgitation. No evidence of mitral valve stenosis. Tricuspid Valve: The tricuspid valve is normal in structure. Tricuspid valve regurgitation is mild . No evidence of tricuspid stenosis. Aortic Valve: The aortic valve is tricuspid. Aortic valve regurgitation is mild. Aortic regurgitation PHT measures 873 msec. No aortic stenosis is present. Pulmonic Valve: The pulmonic valve was normal in structure. Pulmonic valve regurgitation is trivial. No evidence of pulmonic stenosis. Aorta: The aortic root is normal in size and structure. Venous: The inferior vena cava is normal in size with greater than 50% respiratory variability, suggesting right atrial pressure of 3 mmHg. IAS/Shunts: No atrial level shunt detected by color flow Doppler.  LEFT VENTRICLE PLAX 2D LVIDd:         4.10 cm     Diastology LVIDs:         2.50 cm     LV e' medial:    5.29 cm/s LV PW:         0.80 cm     LV E/e' medial:  13.0 LV IVS:        0.90 cm     LV e' lateral:   6.80 cm/s LVOT diam:     2.00 cm     LV E/e' lateral: 10.1 LV SV:         75 LV SV Index:   47 LVOT Area:     3.14 cm  LV Volumes (MOD) LV vol d, MOD A2C: 45.0 ml LV vol d, MOD A4C: 76.8 ml LV vol s, MOD A2C: 24.3 ml LV vol s, MOD A4C: 29.0 ml LV SV MOD A2C:     20.7 ml LV SV MOD A4C:     76.8 ml LV SV MOD BP:      35.8 ml RIGHT VENTRICLE RV S prime:     12.10 cm/s TAPSE (M-mode): 1.9 cm LEFT ATRIUM             Index        RIGHT ATRIUM           Index LA diam:        3.50 cm 2.21 cm/m   RA Area:     10.80 cm LA Vol (A2C):   22.8 ml 14.39 ml/m  RA Volume:   19.40 ml  12.24 ml/m LA Vol (A4C):   24.2 ml 15.27 ml/m LA Biplane Vol: 23.6 ml 14.89 ml/m  AORTIC VALVE LVOT Vmax:         109.00 cm/s LVOT Vmean:        70.300 cm/s LVOT VTI:  0.239 m AI PHT:            873 msec AR Vena Contracta: 0.30 cm  AORTA Ao Root diam: 3.30 cm Ao Asc diam:  3.60 cm MITRAL VALVE                TRICUSPID VALVE MV Area (PHT): 3.08 cm     TR Peak grad:   27.7 mmHg MV Decel Time: 246 msec      TR Vmax:        263.00 cm/s MV E velocity: 68.90 cm/s MV A velocity: 102.00 cm/s  SHUNTS MV E/A ratio:  0.68         Systemic VTI:  0.24 m                             Systemic Diam: 2.00 cm Kirk Ruths MD Electronically signed by Kirk Ruths MD Signature Date/Time: 12/12/2020/12:37:43 PM    Final         Scheduled Meds:  escitalopram  20 mg Oral Daily   gabapentin  300 mg Oral QHS   levothyroxine  137 mcg Oral QAC breakfast   ondansetron (ZOFRAN) IV  4 mg Intravenous Once   pantoprazole  40 mg Oral Daily   pramipexole  0.25 mg Oral QHS   propranolol  10 mg Oral BID   Continuous Infusions:   LOS: 1 day    Time spent: 2min    Domenic Polite, MD [facility]   12/13/2020, 2:53 PM

## 2020-12-13 NOTE — Telephone Encounter (Signed)
Pt daughter states that  Cheryl Stafford is currently admitted into the Hospital after suffering a  severe concussion. Pt was scheduled for an EGD on 12/18/2020.  Procedure was rescheduled for the EGD to  01/31/2021 @ 10:00 AM with Dr. Lyndel Safe in the [facility] Pt was scheduled for an follow up visit for Dr. Lyndel Safe on 1/10 2023 @ 11:20 with Dr. Lyndel Safe in [facility].  Ambulatory referral to GI made.  Updated Prep Instructions sent to pt and Pt daughter [name] by mail. [name] Verbalized understanding with all questions answered.

## 2020-12-14 ENCOUNTER — Inpatient Hospital Stay (HOSPITAL_COMMUNITY): Payer: Medicare Other

## 2020-12-14 MED ORDER — LIDOCAINE 5 % EX PTCH
1.0000 | MEDICATED_PATCH | Freq: Every day | CUTANEOUS | Status: DC
  Administered 2020-12-14 – 2020-12-18 (×5): 1 via TRANSDERMAL
  Filled 2020-12-14 (×5): qty 1

## 2020-12-14 MED ORDER — TRAMADOL HCL 50 MG PO TABS
50.0000 mg | ORAL_TABLET | Freq: Two times a day (BID) | ORAL | Status: DC | PRN
  Administered 2020-12-14 – 2020-12-16 (×3): 50 mg via ORAL
  Filled 2020-12-14 (×3): qty 1

## 2020-12-14 MED ORDER — MECLIZINE HCL 25 MG PO TABS
12.5000 mg | ORAL_TABLET | Freq: Two times a day (BID) | ORAL | Status: DC
  Administered 2020-12-14 – 2020-12-18 (×9): 12.5 mg via ORAL
  Filled 2020-12-14 (×9): qty 1

## 2020-12-14 NOTE — Progress Notes (Addendum)
PROGRESS NOTE    Cheryl Stafford  QPY:[id number] DOB: March 09, 1929 DOA: 12/11/2020 PCP: Nicoletta Dress, MD  Brief Narrative: [name] with history of type 2 diabetes mellitus, iron deficiency anemia, hypertension, hypothyroidism, GERD, hiatal hernia fell backwards while picking up a package, struck her head, had small laceration with bleeding from her scalp, was brought to [facility] ED. -CT head noted tiny right temporal SAH, EDP discussed with neurosurgery, no further work-up recommended. -Subsequently patient had dizziness, nausea and an episode of vomiting, TRH was consulted for admission, chest x-ray noted cardiomegaly   Assessment & Plan:   Subarachnoid hemorrhage following injury with concussion  -Following mechanical fall - case d/w Neurosurgery on admission, no workup or follow-up imaging recommended - Postconcussion symptoms  -supportive care -today with worsening dizziness and headaches, will repeat CT head  Left lateral chest wall pain -Following mechanical fall, x-ray rib series negative -tramadol, tylenol -lidocaine patch  Cardiomegaly -Noted on x-ray, no symptoms of CHF, pericardial effusion -2D echo noted preserved EF, grade 1 diastolic dysfunction , no significant valvular disease, no pericardial effusion  Hypertension -Blood pressure soft, continue propranolol, hold amlodipine and ARB -Blood pressure is more stable today  Hypothyroidism -Continue Synthroid  DVT prophylaxis: SCDs Code Status: Full Code Family Communication: NO family at bedside, updated daughter  Disposition Plan:  Status is: Inpatient   Consultants:  D/w neurosurgery on admission  Procedures:   Antimicrobials:    Subjective: -Complains of severe chest wall pain this morning  Objective: Vitals:   12/14/20 0001 12/14/20 0337 12/14/20 0747 12/14/20 1136  BP: (!) 110/49 110/68 127/66 127/63  Pulse: (!) 53 (!) 50 (!) 53 (!) 52  Resp: (!) 24 20 17 17   Temp: 98.8 F (37.1 C) 98.6 F (37 C)  98.5 F (36.9 C) 97.9 F (36.6 C)  TempSrc: Oral Oral Oral Oral  SpO2: 96% 95% 95% 95%  Weight:        Intake/Output Summary (Last 24 hours) at 12/14/2020 1222 Last data filed at 12/14/2020 0359 Gross per 24 hour  Intake --  Output 900 ml  Net -900 ml   Filed Weights   12/13/20 0648  Weight: 59.1 kg    Examination:  General exam: Pleasant elderly female sitting up in bed, AAOx3, uncomfortable appearing HEENT: Scab on her scalp, no active bleeding now CVS: S1-S2, regular rhythm, distant heart sounds Lungs: Decreased breath sounds to bases, left lateral chest wall tenderness Abdomen: Soft, nontender, bowel sounds present Extremities: No edema  Skin: No rash on exposed skin Psych: Appropriate mood and affect   Data Reviewed:   CBC: Recent Labs  Lab 12/11/20 0050 12/12/20 0802 12/13/20 0339  WBC 13.1* 9.2 8.4  NEUTROABS 11.5* 6.7 5.3  HGB 11.7* 10.8* 9.6*  HCT 35.5* 33.3* 30.0*  MCV 94.2 95.1 97.1  PLT 229 218 124   Basic Metabolic Panel: Recent Labs  Lab 12/11/20 0050 12/12/20 0802 12/13/20 0339  NA 133* 133* 134*  K 3.9 4.0 4.1  CL 101 98 107  CO2 25 29 22   GLUCOSE 150* 114* 107*  BUN 17 15 16   CREATININE 0.73 0.84 0.75  CALCIUM 8.8* 8.9 8.1*  MG  --  2.4 2.0   GFR: Estimated Creatinine Clearance: 40.2 mL/min (by C-G formula based on SCr of 0.75 mg/dL). Liver Function Tests: Recent Labs  Lab 12/11/20 0050 12/12/20 0802 12/13/20 0339  AST 22 19 13*  ALT 15 14 9   ALKPHOS 46 39 34*  BILITOT 0.5 0.8 0.5  PROT 6.1* 5.8* 4.9*  ALBUMIN 3.4* 3.3* 2.5*   No results for input(s): LIPASE, AMYLASE in the last 168 hours. No results for input(s): AMMONIA in the last 168 hours. Coagulation Profile: Recent Labs  Lab 12/11/20 0050  INR 1.1   Cardiac Enzymes: No results for input(s): CKTOTAL, CKMB, CKMBINDEX, TROPONINI in the last 168 hours. BNP (last 3 results) No results for input(s): PROBNP in the last 8760 hours. HbA1C: Recent Labs     12/13/20 0339  HGBA1C 5.6   CBG: Recent Labs  Lab 12/12/20 1617 12/12/20 2235 12/13/20 0750 12/13/20 1125 12/13/20 1539  GLUCAP 131* 120* 135* 120* 105*   Lipid Profile: No results for input(s): CHOL, HDL, LDLCALC, TRIG, CHOLHDL, LDLDIRECT in the last 72 hours. Thyroid Function Tests: No results for input(s): TSH, T4TOTAL, FREET4, T3FREE, THYROIDAB in the last 72 hours. Anemia Panel: No results for input(s): VITAMINB12, FOLATE, FERRITIN, TIBC, IRON, RETICCTPCT in the last 72 hours. Urine analysis:    Component Value Date/Time   COLORURINE YELLOW 12/12/2020 [place] 12/12/2020 0644   LABSPEC 1.015 12/12/2020 0644   PHURINE 6.5 12/12/2020 [place] 12/12/2020 0644   HGBUR NEGATIVE 12/12/2020 0644   BILIRUBINUR NEGATIVE 12/12/2020 0644   KETONESUR NEGATIVE 12/12/2020 0644   PROTEINUR NEGATIVE 12/12/2020 0644   NITRITE NEGATIVE 12/12/2020 0644   LEUKOCYTESUR NEGATIVE 12/12/2020 0644   Sepsis Labs: @LABRCNTIP (procalcitonin:4,lacticidven:4)  ) Recent Results (from the past 240 hour(s))  Resp Panel by RT-PCR (Flu A&B, Covid) Nasopharyngeal Swab     Status: None   Collection Time: 12/12/20  3:10 AM   Specimen: Nasopharyngeal Swab; Nasopharyngeal(NP) swabs in vial transport medium  Result Value Ref Range Status   SARS Coronavirus 2 by RT PCR NEGATIVE NEGATIVE Final    Comment: (NOTE) SARS-CoV-2 target nucleic acids are NOT DETECTED.  The SARS-CoV-2 RNA is generally detectable in upper respiratory specimens during the acute phase of infection. The lowest concentration of SARS-CoV-2 viral copies this assay can detect is 138 copies/mL. A negative result does not preclude SARS-Cov-2 infection and should not be used as the sole basis for treatment or other patient management decisions. A negative result may occur with  improper specimen collection/handling, submission of specimen other than nasopharyngeal swab, presence of viral mutation(s)  within the areas targeted by this assay, and inadequate number of viral copies(<138 copies/mL). A negative result must be combined with clinical observations, patient history, and epidemiological information. The expected result is Negative.  Fact Sheet for Patients:  EntrepreneurPulse.com.au  Fact Sheet for Healthcare Providers:  IncredibleEmployment.be  This test is no t yet approved or cleared by the [place] FDA and  has been authorized for detection and/or diagnosis of SARS-CoV-2 by FDA under an Emergency Use Authorization (EUA). This EUA will remain  in effect (meaning this test can be used) for the duration of the COVID-19 declaration under Section 564(b)(1) of the Act, 21 U.S.C.section 360bbb-3(b)(1), unless the authorization is terminated  or revoked sooner.       Influenza A by PCR NEGATIVE NEGATIVE Final   Influenza B by PCR NEGATIVE NEGATIVE Final    Comment: (NOTE) The Xpert Xpress SARS-CoV-2/FLU/RSV plus assay is intended as an aid in the diagnosis of influenza from Nasopharyngeal swab specimens and should not be used as a sole basis for treatment. Nasal washings and aspirates are unacceptable for Xpert Xpress SARS-CoV-2/FLU/RSV testing.  Fact Sheet for Patients: EntrepreneurPulse.com.au  Fact Sheet for Healthcare Providers: IncredibleEmployment.be  This test is not yet approved or cleared by the  [place] Architectural technologist and has been authorized for detection and/or diagnosis of SARS-CoV-2 by FDA under an Print production planner (EUA). This EUA will remain in effect (meaning this test can be used) for the duration of the COVID-19 declaration under Section 564(b)(1) of the Act, 21 U.S.C. section 360bbb-3(b)(1), unless the authorization is terminated or revoked.  Performed at [facility], [place] [address], [place], [place] [address]   MRSA Next Gen by PCR, Nasal     Status: None    Collection Time: 12/12/20  9:11 PM   Specimen: Nasal Mucosa; Nasal Swab  Result Value Ref Range Status   MRSA by PCR Next Gen NOT DETECTED NOT DETECTED Final    Comment: (NOTE) The GeneXpert MRSA Assay (FDA approved for NASAL specimens only), is one component of a comprehensive MRSA colonization surveillance program. It is not intended to diagnose MRSA infection nor to guide or monitor treatment for MRSA infections. Test performance is not FDA approved in patients less than 31 years old. Performed at [facility], [place] [address] [place], [place] [address]          Radiology Studies: DG Ribs Unilateral Left  Result Date: 12/13/2020 CLINICAL DATA:  Trauma, fall EXAM: LEFT RIBS - 2 VIEW COMPARISON:  None. FINDINGS: No displaced fracture or dislocation is seen. Osteopenia is seen in bony structures. A skin marker is noted in the lateral aspect of left lower ribs. There is dextroscoliosis at thoracolumbar junction. Degenerative changes are noted in the thoracolumbar junction. IMPRESSION: No displaced fracture is seen in the left ribs. Electronically Signed   By: Elmer Picker M.D.   On: 12/13/2020 16:41        Scheduled Meds:  escitalopram  20 mg Oral Daily   gabapentin  300 mg Oral QHS   levothyroxine  137 mcg Oral QAC breakfast   meclizine  12.5 mg Oral BID   ondansetron (ZOFRAN) IV  4 mg Intravenous Once   pantoprazole  40 mg Oral Daily   pramipexole  0.25 mg Oral QHS   propranolol  10 mg Oral BID   Continuous Infusions:   LOS: 2 days    Time spent: 5min  Domenic Polite, MD [facility]   12/14/2020, 12:22 PM

## 2020-12-14 NOTE — Progress Notes (Signed)
Physical Therapy Treatment Patient Details Name: Cheryl Stafford MRN: [id number] DOB: 1929-10-23 Today's Date: 12/14/2020   History of Present Illness 87 y.o. female presents to [facility] hospital on 12/11/2020 after fall at home. Pt found to have R temporal SAH. PMH includes DMII, HTN, hypothyroidism, GERD, hiatal hernia.    PT Comments    Pt with severe increase in dizziness with rolling for transfers with severe nystagmus.  Tried R and L , worse to R and pt unable to sit EOB.  Nystagmus was hard to assess for direction due to pt closing eyes.  These symptoms were significantly worse than yesterday.  Question if from SAH/concussion, or also BPPV (possibly horizontal canal), but needs further assessment.  Will have vestibular specialist PT f/u for next treatment.  Hopeful, that pt can progress to home level based on how she did yesterday and family support, but is not safe for that today. Notified RN and MD of worsened symptoms today.    Recommendations for follow up therapy are one component of a multi-disciplinary discharge planning process, led by the attending physician.  Recommendations may be updated based on patient status, additional functional criteria and insurance authorization.  Follow Up Recommendations  Home health PT     Assistance Recommended at Discharge Intermittent Supervision/Assistance  Equipment Recommendations  Rolling walker (2 wheels)    Recommendations for Other Services       Precautions / Restrictions Precautions Precautions: Fall Precaution Comments: history of falls     Mobility  Bed Mobility Overal bed mobility: Needs Assistance Bed Mobility: Rolling Rolling: Supervision         General bed mobility comments: Tried rolling L pt requiring increased time and painful ribs but then developed severe dizziness (room spinning) with severe nystagmus (difficult to assess direction as pt kept closing eyes).  As nystgmus/dizziness eased, attempted to right  but  again limited by severe dizziness with severe nystagmus.  Pt reports R much worse.  Of note HOB ~20 degrees so in position for horizontal canal BPPV testing with rolling    Transfers                        Ambulation/Gait                   Stairs             Wheelchair Mobility    Modified Rankin (Stroke Patients Only)       Balance                                            Cognition Arousal/Alertness: Awake/alert Behavior During Therapy: WFL for tasks assessed/performed Overall Cognitive Status: Within Functional Limits for tasks assessed                                          Exercises      General Comments General comments (skin integrity, edema, etc.): Educated pt on habituation exercises including head turns as able and smooth pursuit.  These exercises slightly increased dizziness but did not cause the room to spin.      Pertinent Vitals/Pain Pain Assessment: Faces Faces Pain Scale: Hurts little more Pain Location: L thorax Pain Descriptors / Indicators: Sore;Sharp Pain Intervention(s): Limited activity within  patient's tolerance;Monitored during session    Home Living                          Prior Function            PT Goals (current goals can now be found in the care plan section) Progress towards PT goals: Not progressing toward goals - comment (worsening dizziness)    Frequency    Min 4X/week      PT Plan Current plan remains appropriate    Co-evaluation              AM-PAC PT "6 Clicks" Mobility   Outcome Measure  Help needed turning from your back to your side while in a flat bed without using bedrails?: A Little Help needed moving from lying on your back to sitting on the side of a flat bed without using bedrails?: A Little Help needed moving to and from a bed to a chair (including a wheelchair)?: A Lot Help needed standing up from a chair using your arms  (e.g., wheelchair or bedside chair)?: A Lot Help needed to walk in hospital room?: A Lot Help needed climbing 3-5 steps with a railing? : A Lot 6 Click Score: 14    End of Session   Activity Tolerance: Other (comment) (limited due to dizziness)   Nurse Communication: Mobility status;Other (comment) (dizziness, not safe for d/c today) PT Visit Diagnosis: Unsteadiness on feet (R26.81);History of falling (Z91.81)     Time: 6286-3817 PT Time Calculation (min) (ACUTE ONLY): 20 min  Charges:  $Therapeutic Activity: 8-22 mins                     Abran Richard, PT Acute Rehab Services Pager [phone] [facility] [place] 12/14/2020, 11:43 AM

## 2020-12-14 NOTE — Care Management Important Message (Signed)
Important Message  Patient Details  Name: Cheryl Stafford MRN: [id number] Date of Birth: 1929-03-04   Medicare Important Message Given:  Yes     Hannah Beat 12/14/2020, 12:41 PM

## 2020-12-14 NOTE — TOC Transition Note (Addendum)
Transition of Care (TOC) - CM/SW Discharge Note Marvetta Gibbons RN,BSN Transitions of Care Unit 4NP (Non Trauma)- RN Case Manager See Treatment Team for direct Phone #    Patient Details  Name: Keayra Graham MRN: [id number] Date of Birth: 1929-02-23  Transition of Care [facility]) CM/SW Contact:  Dawayne Patricia, RN Phone Number: 12/14/2020, 1:00 PM   Clinical Narrative:    Pt from home alone, Order placed for [facility] and DME needs. CM spoke with pt at bedside for transition needs. Per pt she is planning on going to stay with her son- Ronalee Belts Address to [facility] [address] [place] [address] Phone: [phone]  Choice offered for [facility] needs- list provided Per CMS guidelines from medicare.gov website with star ratings (copy placed in shadow chart) Pt has selected [facility]- if unable to accept referral pt voiced she does not have a second preference and will defer to CM to secure [facility] on her behalf Pt agreeable to using in house provider for DME need-RW  Call made to [employer] for RW- DME - RW to be delivered to room prior to discharge.  Call made to [facility]- spoke with Dorian Pod- referral has been accepted- start of care will be first of next week (mon/tues)-  Please notify [facility] of discharge- [phone]  TC made to son- Ronalee Belts to confirm address, and update on [facility] services that have been arranged- Ronalee Belts plans to transport pt home when medically cleared.     Final next level of care: [place] Barriers to Discharge: Continued Medical Work up   Patient Goals and CMS Choice Patient states their goals for this hospitalization and ongoing recovery are:: return home CMS Medicare.gov Compare Post Acute Care list provided to:: Patient Choice offered to / list presented to : Patient  Discharge Placement                 Home w/ So [facility]      Discharge Plan and Services   Discharge Planning Services: CM Consult Post Acute Care Choice: Home Health, Durable  Medical Equipment          DME Arranged: Walker rolling DME Agency: [employer] Date DME Agency Contacted: 12/14/20 Time DME Agency Contacted: [phone] Representative spoke with at DME Agency: Freda Munro [place]: PT [place]: [place] [facility] Date [place]: 12/14/20 Time [place]: 1200 Representative spoke with at [place]: [place] ([place]) Interventions     Readmission Risk Interventions Readmission Risk Prevention Plan 12/14/2020  Post Dischage Appt Complete  Medication Screening Complete  Transportation Screening Complete  Some recent data might be hidden

## 2020-12-15 LAB — BASIC METABOLIC PANEL
Anion gap: 8 (ref 5–15)
BUN: 10 mg/dL (ref 8–23)
CO2: 26 mmol/L (ref 22–32)
Calcium: 8.7 mg/dL — ABNORMAL LOW (ref 8.9–10.3)
Chloride: 100 mmol/L (ref 98–111)
Creatinine, Ser: 0.82 mg/dL (ref 0.44–1.00)
GFR, Estimated: >60 mL/min (ref >60)
Glucose, Bld: 104 mg/dL — ABNORMAL HIGH (ref 70–99)
Potassium: 4.1 mmol/L (ref 3.5–5.1)
Sodium: 134 mmol/L — ABNORMAL LOW (ref 135–145)

## 2020-12-15 LAB — CBC
HCT: 31.5 % — ABNORMAL LOW (ref 36.0–46.0)
Hemoglobin: 10.2 g/dL — ABNORMAL LOW (ref 12.0–15.0)
MCH: 30.6 pg (ref 26.0–34.0)
MCHC: 32.4 g/dL (ref 30.0–36.0)
MCV: 94.6 fL (ref 80.0–100.0)
Platelets: 201 10*3/uL (ref 150–400)
RBC: 3.33 MIL/uL — ABNORMAL LOW (ref 3.87–5.11)
RDW: 12.5 % (ref 11.5–15.5)
WBC: 8.4 10*3/uL (ref 4.0–10.5)
nRBC: 0 % (ref 0.0–0.2)

## 2020-12-15 NOTE — Progress Notes (Signed)
PROGRESS NOTE    Cheryl Stafford  MHD:[id number] DOB: 01-Jun-1929 DOA: 12/11/2020 PCP: Nicoletta Dress, MD  Brief Narrative: [name] with history of type 2 diabetes mellitus, iron deficiency anemia, hypertension, hypothyroidism, GERD, hiatal hernia fell backwards while picking up a package, struck her head, had small laceration with bleeding from her scalp, was brought to [facility] ED. -CT head noted tiny right temporal SAH, EDP discussed with neurosurgery, no further work-up recommended. -Subsequently patient had dizziness, nausea and an episode of vomiting, [facility] was consulted for admission, chest x-ray noted cardiomegaly   Assessment & Plan:   Subarachnoid hemorrhage following injury with concussion Dizziness  -Following mechanical fall - case d/w Neurosurgery on admission, no workup or follow-up imaging recommended - Postconcussion symptoms  -supportive care -MRI for worsening dizziness yesterday-noted small SDH, trace residual SAH and contusion, nonhemorrhagic at the right temporal lobe  -Continue supportive care, Tylenol, meclizine -Vestibular eval today with PT  Left lateral chest wall pain/point tenderness -Following mechanical fall, x-ray rib series negative -tramadol, tylenol -lidocaine patch  Cardiomegaly -Noted on x-ray, no symptoms of CHF, pericardial effusion -2D echo noted preserved EF, grade 1 diastolic dysfunction , no significant valvular disease, no pericardial effusion  Hypertension -Blood pressure soft, continue propranolol, hold amlodipine and ARB -Blood pressure is more stable now  Hypothyroidism -Continue Synthroid  DVT prophylaxis: SCDs Code Status: Full Code Family Communication: NO family at bedside, updated daughter yesterday Disposition Plan:  Status is: Inpatient   Consultants:  D/w neurosurgery on admission  Procedures:   Antimicrobials:    Subjective: -Reports dizziness, feels uneasy, left lateral chest wall tenderness  Objective: Vitals:    12/14/20 2212 12/14/20 2347 12/15/20 0319 12/15/20 0724  BP: 130/69 (!) 132/96 121/72 (!) 159/70  Pulse: 60 (!) 56 (!) 58 (!) 54  Resp:  16 20 19   Temp:  98.7 F (37.1 C) 98.7 F (37.1 C) 98.1 F (36.7 C)  TempSrc:  Oral Oral Oral  SpO2:  96% 100% 99%  Weight:        Intake/Output Summary (Last 24 hours) at 12/15/2020 1129 Last data filed at 12/14/2020 2350 Gross per 24 hour  Intake --  Output 450 ml  Net -450 ml   Filed Weights   12/13/20 0648  Weight: 59.1 kg    Examination:  General exam: Pleasant elderly female sitting up in bed, uncomfortable appearing, AAOx3 HEENT: Scab on scalp, no active bleeding CVS: S1-S2, regular rate rhythm Lungs: Decreased breath sounds bases, lateral chest wall tenderness Abdomen: Soft, nontender, bowel sounds present  Extremities: No edema  Skin: No rash on exposed skin Psych: Appropriate mood and affect   Data Reviewed:   CBC: Recent Labs  Lab 12/11/20 0050 12/12/20 0802 12/13/20 0339 12/15/20 0313  WBC 13.1* 9.2 8.4 8.4  NEUTROABS 11.5* 6.7 5.3  --   HGB 11.7* 10.8* 9.6* 10.2*  HCT 35.5* 33.3* 30.0* 31.5*  MCV 94.2 95.1 97.1 94.6  PLT 229 218 180 211   Basic Metabolic Panel: Recent Labs  Lab 12/11/20 0050 12/12/20 0802 12/13/20 0339 12/15/20 0313  NA 133* 133* 134* 134*  K 3.9 4.0 4.1 4.1  CL 101 98 107 100  CO2 25 29 22 26   GLUCOSE 150* 114* 107* 104*  BUN 17 15 16 10   CREATININE 0.73 0.84 0.75 0.82  CALCIUM 8.8* 8.9 8.1* 8.7*  MG  --  2.4 2.0  --    GFR: Estimated Creatinine Clearance: 39.2 mL/min (by C-G formula based on SCr of 0.82 mg/dL). Liver Function  Tests: Recent Labs  Lab 12/11/20 0050 12/12/20 0802 12/13/20 0339  AST 22 19 13*  ALT 15 14 9   ALKPHOS 46 39 34*  BILITOT 0.5 0.8 0.5  PROT 6.1* 5.8* 4.9*  ALBUMIN 3.4* 3.3* 2.5*   No results for input(s): LIPASE, AMYLASE in the last 168 hours. No results for input(s): AMMONIA in the last 168 hours. Coagulation Profile: Recent Labs  Lab  12/11/20 0050  INR 1.1   Cardiac Enzymes: No results for input(s): CKTOTAL, CKMB, CKMBINDEX, TROPONINI in the last 168 hours. BNP (last 3 results) No results for input(s): PROBNP in the last 8760 hours. HbA1C: Recent Labs    12/13/20 0339  HGBA1C 5.6   CBG: Recent Labs  Lab 12/12/20 1617 12/12/20 2235 12/13/20 0750 12/13/20 1125 12/13/20 1539  GLUCAP 131* 120* 135* 120* 105*   Lipid Profile: No results for input(s): CHOL, HDL, LDLCALC, TRIG, CHOLHDL, LDLDIRECT in the last 72 hours. Thyroid Function Tests: No results for input(s): TSH, T4TOTAL, FREET4, T3FREE, THYROIDAB in the last 72 hours. Anemia Panel: No results for input(s): VITAMINB12, FOLATE, FERRITIN, TIBC, IRON, RETICCTPCT in the last 72 hours. Urine analysis:    Component Value Date/Time   COLORURINE YELLOW 12/12/2020 [place] 12/12/2020 0644   LABSPEC 1.015 12/12/2020 0644   PHURINE 6.5 12/12/2020 [place] 12/12/2020 0644   HGBUR NEGATIVE 12/12/2020 0644   BILIRUBINUR NEGATIVE 12/12/2020 0644   KETONESUR NEGATIVE 12/12/2020 0644   PROTEINUR NEGATIVE 12/12/2020 0644   NITRITE NEGATIVE 12/12/2020 0644   LEUKOCYTESUR NEGATIVE 12/12/2020 0644   Sepsis Labs: @LABRCNTIP (procalcitonin:4,lacticidven:4)  ) Recent Results (from the past 240 hour(s))  Resp Panel by RT-PCR (Flu A&B, Covid) Nasopharyngeal Swab     Status: None   Collection Time: 12/12/20  3:10 AM   Specimen: Nasopharyngeal Swab; Nasopharyngeal(NP) swabs in vial transport medium  Result Value Ref Range Status   SARS Coronavirus 2 by RT PCR NEGATIVE NEGATIVE Final    Comment: (NOTE) SARS-CoV-2 target nucleic acids are NOT DETECTED.  The SARS-CoV-2 RNA is generally detectable in upper respiratory specimens during the acute phase of infection. The lowest concentration of SARS-CoV-2 viral copies this assay can detect is 138 copies/mL. A negative result does not preclude SARS-Cov-2 infection and should not be used  as the sole basis for treatment or other patient management decisions. A negative result may occur with  improper specimen collection/handling, submission of specimen other than nasopharyngeal swab, presence of viral mutation(s) within the areas targeted by this assay, and inadequate number of viral copies(<138 copies/mL). A negative result must be combined with clinical observations, patient history, and epidemiological information. The expected result is Negative.  Fact Sheet for Patients:  EntrepreneurPulse.com.au  Fact Sheet for Healthcare Providers:  IncredibleEmployment.be  This test is no t yet approved or cleared by the [place] FDA and  has been authorized for detection and/or diagnosis of SARS-CoV-2 by FDA under an Emergency Use Authorization (EUA). This EUA will remain  in effect (meaning this test can be used) for the duration of the COVID-19 declaration under Section 564(b)(1) of the Act, 21 U.S.C.section 360bbb-3(b)(1), unless the authorization is terminated  or revoked sooner.       Influenza A by PCR NEGATIVE NEGATIVE Final   Influenza B by PCR NEGATIVE NEGATIVE Final    Comment: (NOTE) The Xpert Xpress SARS-CoV-2/FLU/RSV plus assay is intended as an aid in the diagnosis of influenza from Nasopharyngeal swab specimens and should not be used as a sole basis  for treatment. Nasal washings and aspirates are unacceptable for Xpert Xpress SARS-CoV-2/FLU/RSV testing.  Fact Sheet for Patients: EntrepreneurPulse.com.au  Fact Sheet for Healthcare Providers: IncredibleEmployment.be  This test is not yet approved or cleared by the [place] FDA and has been authorized for detection and/or diagnosis of SARS-CoV-2 by FDA under an Emergency Use Authorization (EUA). This EUA will remain in effect (meaning this test can be used) for the duration of the COVID-19 declaration under Section 564(b)(1)  of the Act, 21 U.S.C. section 360bbb-3(b)(1), unless the authorization is terminated or revoked.  Performed at [facility], [place] [address], [place], [place] [address]   MRSA Next Gen by PCR, Nasal     Status: None   Collection Time: 12/12/20  9:11 PM   Specimen: Nasal Mucosa; Nasal Swab  Result Value Ref Range Status   MRSA by PCR Next Gen NOT DETECTED NOT DETECTED Final    Comment: (NOTE) The GeneXpert MRSA Assay (FDA approved for NASAL specimens only), is one component of a comprehensive MRSA colonization surveillance program. It is not intended to diagnose MRSA infection nor to guide or monitor treatment for MRSA infections. Test performance is not FDA approved in patients less than 46 years old. Performed at [facility], [place] [address], [place], [place] [address]          Radiology Studies: DG Ribs Unilateral Left  Result Date: 12/13/2020 CLINICAL DATA:  Trauma, fall EXAM: LEFT RIBS - 2 VIEW COMPARISON:  None. FINDINGS: No displaced fracture or dislocation is seen. Osteopenia is seen in bony structures. A skin marker is noted in the lateral aspect of left lower ribs. There is dextroscoliosis at thoracolumbar junction. Degenerative changes are noted in the thoracolumbar junction. IMPRESSION: No displaced fracture is seen in the left ribs. Electronically Signed   By: Elmer Picker M.D.   On: 12/13/2020 16:41   CT HEAD WO CONTRAST (5MM)  Result Date: 12/14/2020 CLINICAL DATA:  Head trauma.  Follow-up intracranial hemorrhage EXAM: CT HEAD WITHOUT CONTRAST TECHNIQUE: Contiguous axial images were obtained from the base of the skull through the vertex without intravenous contrast. COMPARISON:  CT head 12/11/2020 FINDINGS: Brain: Resolution of small amount of subarachnoid hemorrhage in the right temporal region. Small subdural hematoma along the posterior falx and right tentorium. This was present previously however has migrated posteriorly in the interval. Question  new hypodensity right temporal lobe. Reference axial image 12. This could be due to acute infarct or possibly artifact. Mild atrophy. Moderate chronic microvascular ischemic change throughout the white matter. Vascular: Negative for hyperdense vessel Skull: Negative Sinuses/Orbits: Air-fluid level sphenoid sinus. Remaining paranasal sinuses clear. Bilateral ocular surgery. Other: None IMPRESSION: 1. Interval resolution of small volume right temporal subarachnoid hemorrhage 2. Small subdural hematoma along the posterior falx and tentorium on the right. There was subdural hemorrhage along the tentorium previously but this has migrated posteriorly. 3. Atrophy and chronic microvascular ischemia 4. Question new area of low attenuation in the right temporal lobe which could be due to acute infarct or artifact. Correlate with symptoms. MRI may be helpful for further evaluation. Electronically Signed   By: Franchot Gallo M.D.   On: 12/14/2020 12:27   MR BRAIN WO CONTRAST  Result Date: 12/15/2020 CLINICAL DATA:  Initial evaluation for recurrent dizziness. EXAM: MRI HEAD WITHOUT CONTRAST TECHNIQUE: Multiplanar, multiecho pulse sequences of the brain and surrounding structures were obtained without intravenous contrast. COMPARISON:  Prior CT from earlier same day as well as previous exams. FINDINGS: Brain:  Cerebral volume within normal limits for age. Scattered patchy T2/FLAIR hyperintensity involving the periventricular and deep white matter both cerebral hemispheres, most consistent with chronic small vessel ischemic disease, moderate in nature. Again seen is a small subdural hematoma overlying the posterior right cerebral convexity (series 7, image 17). This measures up to 3 mm in maximal thickness. Extension along the right tentorium noted. No significant mass effect. Scattered FLAIR signal seen within several underlying cortical sulci, consistent with trace residual subarachnoid hemorrhage. Trace subarachnoid blood  also noted at the posterior left temporal region. Trace intraventricular hemorrhage noted within the occipital horns of both lateral ventricles, likely related to redistribution (series 8, image 10). 13 mm focus of parenchymal FLAIR signal at the peripheral right temporal lobe likely reflects an evolving nonhemorrhagic cortical contusion (series 7, image 9). No evidence for acute or subacute infarct. Gray-white matter differentiation otherwise maintained. No other areas of chronic cortical infarction. No other chronic blood products seen elsewhere within the brain. No mass lesion, midline shift, or mass effect. No hydrocephalus. Pituitary gland suprasellar region within normal limits. Midline structures intact. Vascular: Major intracranial vascular flow voids are maintained. Skull and upper cervical spine: Craniocervical junction within normal limits. Bone marrow signal intensity normal. Mild edema at the posterior scalp near the vertex, likely evolving contusion. Sinuses/Orbits: Globes normal soft tissues within normal limits. Mild mucosal thickening noted within the left sphenoid sinus. Paranasal sinuses are otherwise largely clear. No significant mastoid effusion. Other: None. IMPRESSION: 1. Small 3 mm subdural hematoma overlying the posterior right cerebral convexity with extension along the right tentorium, stable. No significant mass effect. 2. Trace residual subarachnoid hemorrhage, most pronounced at the right occipital region. Trace intraventricular hemorrhage likely related to redistribution. 3. 13 mm evolving nonhemorrhagic cortical contusion at the peripheral right temporal lobe. 4. No other acute intracranial abnormality.  No infarct. 5. Underlying moderate chronic microvascular ischemic disease. Electronically Signed   By: Jeannine Boga M.D.   On: 12/15/2020 01:28        Scheduled Meds:  escitalopram  20 mg Oral Daily   gabapentin  300 mg Oral QHS   levothyroxine  137 mcg Oral QAC  breakfast   lidocaine  1 patch Transdermal Daily   meclizine  12.5 mg Oral BID   ondansetron (ZOFRAN) IV  4 mg Intravenous Once   pantoprazole  40 mg Oral Daily   pramipexole  0.25 mg Oral QHS   propranolol  10 mg Oral BID   Continuous Infusions:   LOS: 3 days    Time spent: 6min  Domenic Polite, MD [facility]   12/15/2020, 11:29 AM

## 2020-12-15 NOTE — Progress Notes (Signed)
Physical Therapy Treatment Patient Details Name: Cheryl Stafford MRN: [id number] DOB: Sep 05, 1929 Today's Date: 12/15/2020   History of Present Illness 87 y.o. female presents [facility] hospital on 12/11/2020 after fall at home. Pt found to have R temporal SAH. PMH includes DMII, HTN, hypothyroidism, GERD, hiatal hernia.    PT Comments    Patient assessed for all forms of BPPV with testing negative. Spinning with nystagmus elicited only once, as pt rolled from right side to supine and noted nystagmus was vertical (indicative of central cause). After assessment, assisted pt OOB and walking in room. Remains very limited by left side pain. Desaturated to 86% on room air; 2L resumed at rest increased to 96%. Reviewed use of IS.     Recommendations for follow up therapy are one component of a multi-disciplinary discharge planning process, led by the attending physician.  Recommendations may be updated based on patient status, additional functional criteria and insurance authorization.  Follow Up Recommendations  Home health PT     Assistance Recommended at Discharge Intermittent Supervision/Assistance  Equipment Recommendations  Rolling walker (2 wheels)    Recommendations for Other Services       Precautions / Restrictions Precautions Precautions: Fall Precaution Comments: history of falls Restrictions RUE Weight Bearing: Weight bearing as tolerated LUE Weight Bearing: Weight bearing as tolerated RLE Weight Bearing: Weight bearing as tolerated LLE Weight Bearing: Weight bearing as tolerated     Mobility  Bed Mobility Overal bed mobility: Needs Assistance Bed Mobility: Rolling Rolling: Supervision (with rail) Sidelying to sit: Max assist (HOB flat; with rail; limited by left side/rib pain)       General bed mobility comments: rolled left with no nystagmus or dizziness; rolled rt with no nystagmus/dizziness; as returning to supine from right side +dizziness/spinning and noted vertical  nystagmus indicative of central cause.  pt reports fixing her gaze on the light above her helped the spinning stop.  Performed modified Dix-Hallpike using hospital bed with no nystagmus/dizziness elicited.    Transfers Overall transfer level: Needs assistance Equipment used: Rolling walker (2 wheels);1 person hand held assist Transfers: Sit to/from Stand;Bed to chair/wheelchair/BSC Sit to Stand: Min assist                Ambulation/Gait Ambulation/Gait assistance: Min guard Gait Distance (Feet): 30 Feet Assistive device: Rolling walker (2 wheels) Gait Pattern/deviations: Step-through pattern Gait velocity: reduced     General Gait Details: pt with slowed step-through gait   Stairs             Wheelchair Mobility    Modified Rankin (Stroke Patients Only)       Balance Overall balance assessment: Needs assistance Sitting-balance support: No upper extremity supported;Feet supported Sitting balance-Leahy Scale: Fair     Standing balance support: Single extremity supported;Bilateral upper extremity supported Standing balance-Leahy Scale: Poor Standing balance comment: minG with BUE support of walker, minA with unilateral hand hold                            Cognition Arousal/Alertness: Awake/alert Behavior During Therapy: WFL for tasks assessed/performed Overall Cognitive Status: Within Functional Limits for tasks assessed                                          Exercises Other Exercises Other Exercises: educated in use of IS and frequency; pt only able  to pull ~36ml x 7 reps    General Comments General comments (skin integrity, edema, etc.): removed 2L O2 and at rest sats 91%; with ambulation on room air decr to 86%; resumed 2L at rest with sats 96%      Pertinent Vitals/Pain Pain Assessment: Faces Faces Pain Scale: Hurts whole lot Pain Location: L thorax Pain Descriptors / Indicators: Sore;Sharp Pain Intervention(s):  Limited activity within patient's tolerance;Monitored during session;Repositioned    Home Living                          Prior Function            PT Goals (current goals can now be found in the care plan section) Acute Rehab PT Goals Patient Stated Goal: to go home with family assistance PT Goal Formulation: With patient Time For Goal Achievement: 12/27/20 Potential to Achieve Goals: Good Progress towards PT goals: Progressing toward goals (slowly)    Frequency    Min 4X/week      PT Plan Current plan remains appropriate (once pain better managed)    Co-evaluation              AM-PAC PT "6 Clicks" Mobility   Outcome Measure  Help needed turning from your back to your side while in a flat bed without using bedrails?: A Little Help needed moving from lying on your back to sitting on the side of a flat bed without using bedrails?: A Lot Help needed moving to and from a bed to a chair (including a wheelchair)?: A Little Help needed standing up from a chair using your arms (e.g., wheelchair or bedside chair)?: A Little Help needed to walk in hospital room?: A Little Help needed climbing 3-5 steps with a railing? : A Little 6 Click Score: 17    End of Session Equipment Utilized During Treatment: Gait belt;Oxygen Activity Tolerance: Patient limited by pain Patient left: in chair;with chair alarm set;with call bell/phone within reach Nurse Communication: Mobility status (decr sats with walking on room air) PT Visit Diagnosis: Unsteadiness on feet (R26.81);History of falling (Z91.81)     Time: 0347-4259 PT Time Calculation (min) (ACUTE ONLY): 36 min  Charges:  $Gait Training: 8-22 mins $Therapeutic Activity: 8-22 mins                      Arby Barrette, PT Acute Rehabilitation Services  Pager [phone] Office [phone]    Rexanne Mano 12/15/2020, 10:11 AM

## 2020-12-16 LAB — CBC
HCT: 33 % — ABNORMAL LOW (ref 36.0–46.0)
Hemoglobin: 10.6 g/dL — ABNORMAL LOW (ref 12.0–15.0)
MCH: 30.4 pg (ref 26.0–34.0)
MCHC: 32.1 g/dL (ref 30.0–36.0)
MCV: 94.6 fL (ref 80.0–100.0)
Platelets: 227 10*3/uL (ref 150–400)
RBC: 3.49 MIL/uL — ABNORMAL LOW (ref 3.87–5.11)
RDW: 12.5 % (ref 11.5–15.5)
WBC: 7.5 10*3/uL (ref 4.0–10.5)
nRBC: 0 % (ref 0.0–0.2)

## 2020-12-16 LAB — BASIC METABOLIC PANEL
Anion gap: 6 (ref 5–15)
BUN: 12 mg/dL (ref 8–23)
CO2: 28 mmol/L (ref 22–32)
Calcium: 8.8 mg/dL — ABNORMAL LOW (ref 8.9–10.3)
Chloride: 99 mmol/L (ref 98–111)
Creatinine, Ser: 0.82 mg/dL (ref 0.44–1.00)
GFR, Estimated: >60 mL/min (ref >60)
Glucose, Bld: 102 mg/dL — ABNORMAL HIGH (ref 70–99)
Potassium: 3.8 mmol/L (ref 3.5–5.1)
Sodium: 133 mmol/L — ABNORMAL LOW (ref 135–145)

## 2020-12-16 MED ORDER — KETOROLAC TROMETHAMINE 15 MG/ML IJ SOLN
7.5000 mg | Freq: Three times a day (TID) | INTRAMUSCULAR | Status: AC
  Administered 2020-12-16 – 2020-12-17 (×3): 7.5 mg via INTRAVENOUS
  Filled 2020-12-16 (×3): qty 1

## 2020-12-16 MED ORDER — TRAMADOL HCL 50 MG PO TABS
50.0000 mg | ORAL_TABLET | Freq: Four times a day (QID) | ORAL | Status: DC | PRN
  Administered 2020-12-16 – 2020-12-18 (×4): 50 mg via ORAL
  Filled 2020-12-16 (×4): qty 1

## 2020-12-16 MED ORDER — SENNOSIDES-DOCUSATE SODIUM 8.6-50 MG PO TABS
1.0000 | ORAL_TABLET | Freq: Two times a day (BID) | ORAL | Status: DC
  Administered 2020-12-16 – 2020-12-18 (×5): 1 via ORAL
  Filled 2020-12-16 (×5): qty 1

## 2020-12-16 MED ORDER — POLYETHYLENE GLYCOL 3350 17 G PO PACK
17.0000 g | PACK | Freq: Every day | ORAL | Status: DC
  Administered 2020-12-16 – 2020-12-18 (×3): 17 g via ORAL
  Filled 2020-12-16 (×3): qty 1

## 2020-12-16 NOTE — Progress Notes (Signed)
PROGRESS NOTE    Mackena Plummer  YPP:[id number] DOB: September 10, 1929 DOA: 12/11/2020 PCP: Nicoletta Dress, MD  Brief Narrative: [name] with history of type 2 diabetes mellitus, iron deficiency anemia, hypertension, hypothyroidism, GERD, hiatal hernia fell backwards while picking up a package, struck her head, had small laceration with bleeding from her scalp, was brought to [facility] ED. -CT head noted tiny right temporal SAH, EDP discussed with neurosurgery, no further work-up recommended. -Subsequently patient had dizziness, nausea and an episode of vomiting, [facility] was consulted for admission, chest x-ray noted cardiomegaly   Assessment & Plan:   Subarachnoid hemorrhage following injury with concussion Dizziness  -Following mechanical fall - case d/w Neurosurgery on admission, no workup or follow-up imaging recommended - Postconcussion symptoms  -supportive care -MRI for worsening dizziness 12/9-noted small SDH, trace residual SAH and contusion, nonhemorrhagic at the right temporal lobe  -Continue supportive care, Tylenol, meclizine -Ambulate with PT as tolerated, out of bed to chair  Left lateral chest wall pain/point tenderness -Following mechanical fall, x-ray rib series negative -Continues to be bothered by pain, add 2 doses of Toradol, tramadol PRN -lidocaine patch -Incentive spirometry  Cardiomegaly -Noted on x-ray, no symptoms of CHF, pericardial effusion -2D echo noted preserved EF, grade 1 diastolic dysfunction , no significant valvular disease, no pericardial effusion  Hypertension -continue propranolol, hold amlodipine and ARB -Blood pressure is more stable now  Hypothyroidism -Continue Synthroid  DVT prophylaxis: SCDs Code Status: Full Code Family Communication: NO family at bedside, updated daughter 12/7, 12/9 Disposition Plan:  Status is: Inpatient   Consultants:  D/w neurosurgery on admission  Procedures:   Antimicrobials:    Subjective: -Dizziness but mild,  continues to be bothered by left chest wall pain and tenderness  Objective: Vitals:   12/15/20 2127 12/15/20 2259 12/16/20 0300 12/16/20 0721  BP: 125/73 106/78 117/66 116/66  Pulse: 60 (!) 48 (!) 47 (!) 52  Resp:  19 20 18   Temp:  98 F (36.7 C) 98.6 F (37 C) (!) 97.5 F (36.4 C)  TempSrc:  Oral Oral Oral  SpO2:  96% 96% 96%  Weight:        Intake/Output Summary (Last 24 hours) at 12/16/2020 1038 Last data filed at 12/15/2020 1354 Gross per 24 hour  Intake --  Output 425 ml  Net -425 ml   Filed Weights   12/13/20 0648  Weight: 59.1 kg    Examination:  Pleasant 87-year-old female sitting up in bed, slightly uncomfortable appearing, AAOx3 HEENT: No JVD CVS: S1-S2, regular rate rhythm Lungs: Decreased breath sounds to bases, lateral chest wall tenderness posteriorly and mid axillary line Abdomen: Soft, nontender, bowel sounds present Extremities: No edema  Skin: No rash on exposed skin Psych: Appropriate mood and affect   Data Reviewed:   CBC: Recent Labs  Lab 12/11/20 0050 12/12/20 0802 12/13/20 0339 12/15/20 0313 12/16/20 0246  WBC 13.1* 9.2 8.4 8.4 7.5  NEUTROABS 11.5* 6.7 5.3  --   --   HGB 11.7* 10.8* 9.6* 10.2* 10.6*  HCT 35.5* 33.3* 30.0* 31.5* 33.0*  MCV 94.2 95.1 97.1 94.6 94.6  PLT 229 218 180 201 458   Basic Metabolic Panel: Recent Labs  Lab 12/11/20 0050 12/12/20 0802 12/13/20 0339 12/15/20 0313 12/16/20 0246  NA 133* 133* 134* 134* 133*  K 3.9 4.0 4.1 4.1 3.8  CL 101 98 107 100 99  CO2 25 29 22 26 28   GLUCOSE 150* 114* 107* 104* 102*  BUN 17 15 16 10 12   CREATININE 0.73  0.84 0.75 0.82 0.82  CALCIUM 8.8* 8.9 8.1* 8.7* 8.8*  MG  --  2.4 2.0  --   --    GFR: Estimated Creatinine Clearance: 39.2 mL/min (by C-G formula based on SCr of 0.82 mg/dL). Liver Function Tests: Recent Labs  Lab 12/11/20 0050 12/12/20 0802 12/13/20 0339  AST 22 19 13*  ALT 15 14 9   ALKPHOS 46 39 34*  BILITOT 0.5 0.8 0.5  PROT 6.1* 5.8* 4.9*  ALBUMIN  3.4* 3.3* 2.5*   No results for input(s): LIPASE, AMYLASE in the last 168 hours. No results for input(s): AMMONIA in the last 168 hours. Coagulation Profile: Recent Labs  Lab 12/11/20 0050  INR 1.1   Cardiac Enzymes: No results for input(s): CKTOTAL, CKMB, CKMBINDEX, TROPONINI in the last 168 hours. BNP (last 3 results) No results for input(s): PROBNP in the last 8760 hours. HbA1C: No results for input(s): HGBA1C in the last 72 hours.  CBG: Recent Labs  Lab 12/12/20 1617 12/12/20 2235 12/13/20 0750 12/13/20 1125 12/13/20 1539  GLUCAP 131* 120* 135* 120* 105*   Lipid Profile: No results for input(s): CHOL, HDL, LDLCALC, TRIG, CHOLHDL, LDLDIRECT in the last 72 hours. Thyroid Function Tests: No results for input(s): TSH, T4TOTAL, FREET4, T3FREE, THYROIDAB in the last 72 hours. Anemia Panel: No results for input(s): VITAMINB12, FOLATE, FERRITIN, TIBC, IRON, RETICCTPCT in the last 72 hours. Urine analysis:    Component Value Date/Time   COLORURINE YELLOW 12/12/2020 [place] 12/12/2020 0644   LABSPEC 1.015 12/12/2020 0644   PHURINE 6.5 12/12/2020 [place] 12/12/2020 0644   HGBUR NEGATIVE 12/12/2020 0644   BILIRUBINUR NEGATIVE 12/12/2020 0644   KETONESUR NEGATIVE 12/12/2020 0644   PROTEINUR NEGATIVE 12/12/2020 0644   NITRITE NEGATIVE 12/12/2020 0644   LEUKOCYTESUR NEGATIVE 12/12/2020 0644   Sepsis Labs: @LABRCNTIP (procalcitonin:4,lacticidven:4)  ) Recent Results (from the past 240 hour(s))  Resp Panel by RT-PCR (Flu A&B, Covid) Nasopharyngeal Swab     Status: None   Collection Time: 12/12/20  3:10 AM   Specimen: Nasopharyngeal Swab; Nasopharyngeal(NP) swabs in vial transport medium  Result Value Ref Range Status   SARS Coronavirus 2 by RT PCR NEGATIVE NEGATIVE Final    Comment: (NOTE) SARS-CoV-2 target nucleic acids are NOT DETECTED.  The SARS-CoV-2 RNA is generally detectable in upper respiratory specimens during the acute phase  of infection. The lowest concentration of SARS-CoV-2 viral copies this assay can detect is 138 copies/mL. A negative result does not preclude SARS-Cov-2 infection and should not be used as the sole basis for treatment or other patient management decisions. A negative result may occur with  improper specimen collection/handling, submission of specimen other than nasopharyngeal swab, presence of viral mutation(s) within the areas targeted by this assay, and inadequate number of viral copies(<138 copies/mL). A negative result must be combined with clinical observations, patient history, and epidemiological information. The expected result is Negative.  Fact Sheet for Patients:  EntrepreneurPulse.com.au  Fact Sheet for Healthcare Providers:  IncredibleEmployment.be  This test is no t yet approved or cleared by the [place] FDA and  has been authorized for detection and/or diagnosis of SARS-CoV-2 by FDA under an Emergency Use Authorization (EUA). This EUA will remain  in effect (meaning this test can be used) for the duration of the COVID-19 declaration under Section 564(b)(1) of the Act, 21 U.S.C.section 360bbb-3(b)(1), unless the authorization is terminated  or revoked sooner.       Influenza A by PCR NEGATIVE NEGATIVE Final  Influenza B by PCR NEGATIVE NEGATIVE Final    Comment: (NOTE) The Xpert Xpress SARS-CoV-2/FLU/RSV plus assay is intended as an aid in the diagnosis of influenza from Nasopharyngeal swab specimens and should not be used as a sole basis for treatment. Nasal washings and aspirates are unacceptable for Xpert Xpress SARS-CoV-2/FLU/RSV testing.  Fact Sheet for Patients: EntrepreneurPulse.com.au  Fact Sheet for Healthcare Providers: IncredibleEmployment.be  This test is not yet approved or cleared by the [place] FDA and has been authorized for detection and/or diagnosis of  SARS-CoV-2 by FDA under an Emergency Use Authorization (EUA). This EUA will remain in effect (meaning this test can be used) for the duration of the COVID-19 declaration under Section 564(b)(1) of the Act, 21 U.S.C. section 360bbb-3(b)(1), unless the authorization is terminated or revoked.  Performed at [facility], [place] [address], [place], [place] [address]   MRSA Next Gen by PCR, Nasal     Status: None   Collection Time: 12/12/20  9:11 PM   Specimen: Nasal Mucosa; Nasal Swab  Result Value Ref Range Status   MRSA by PCR Next Gen NOT DETECTED NOT DETECTED Final    Comment: (NOTE) The GeneXpert MRSA Assay (FDA approved for NASAL specimens only), is one component of a comprehensive MRSA colonization surveillance program. It is not intended to diagnose MRSA infection nor to guide or monitor treatment for MRSA infections. Test performance is not FDA approved in patients less than 12 years old. Performed at [facility], [place] [address] [place], [place] [address]     Radiology Studies: CT HEAD WO CONTRAST (5MM)  Result Date: 12/14/2020 CLINICAL DATA:  Head trauma.  Follow-up intracranial hemorrhage EXAM: CT HEAD WITHOUT CONTRAST TECHNIQUE: Contiguous axial images were obtained from the base of the skull through the vertex without intravenous contrast. COMPARISON:  CT head 12/11/2020 FINDINGS: Brain: Resolution of small amount of subarachnoid hemorrhage in the right temporal region. Small subdural hematoma along the posterior falx and right tentorium. This was present previously however has migrated posteriorly in the interval. Question new hypodensity right temporal lobe. Reference axial image 12. This could be due to acute infarct or possibly artifact. Mild atrophy. Moderate chronic microvascular ischemic change throughout the white matter. Vascular: Negative for hyperdense vessel Skull: Negative Sinuses/Orbits: Air-fluid level sphenoid sinus. Remaining paranasal sinuses clear.  Bilateral ocular surgery. Other: None IMPRESSION: 1. Interval resolution of small volume right temporal subarachnoid hemorrhage 2. Small subdural hematoma along the posterior falx and tentorium on the right. There was subdural hemorrhage along the tentorium previously but this has migrated posteriorly. 3. Atrophy and chronic microvascular ischemia 4. Question new area of low attenuation in the right temporal lobe which could be due to acute infarct or artifact. Correlate with symptoms. MRI may be helpful for further evaluation. Electronically Signed   By: Franchot Gallo M.D.   On: 12/14/2020 12:27   MR BRAIN WO CONTRAST  Result Date: 12/15/2020 CLINICAL DATA:  Initial evaluation for recurrent dizziness. EXAM: MRI HEAD WITHOUT CONTRAST TECHNIQUE: Multiplanar, multiecho pulse sequences of the brain and surrounding structures were obtained without intravenous contrast. COMPARISON:  Prior CT from earlier same day as well as previous exams. FINDINGS: Brain: Cerebral volume within normal limits for age. Scattered patchy T2/FLAIR hyperintensity involving the periventricular and deep white matter both cerebral hemispheres, most consistent with chronic small vessel ischemic disease, moderate in nature. Again seen is a small subdural hematoma overlying the posterior right cerebral convexity (series 7, image 17). This measures up to  3 mm in maximal thickness. Extension along the right tentorium noted. No significant mass effect. Scattered FLAIR signal seen within several underlying cortical sulci, consistent with trace residual subarachnoid hemorrhage. Trace subarachnoid blood also noted at the posterior left temporal region. Trace intraventricular hemorrhage noted within the occipital horns of both lateral ventricles, likely related to redistribution (series 8, image 10). 13 mm focus of parenchymal FLAIR signal at the peripheral right temporal lobe likely reflects an evolving nonhemorrhagic cortical contusion (series 7,  image 9). No evidence for acute or subacute infarct. Gray-white matter differentiation otherwise maintained. No other areas of chronic cortical infarction. No other chronic blood products seen elsewhere within the brain. No mass lesion, midline shift, or mass effect. No hydrocephalus. Pituitary gland suprasellar region within normal limits. Midline structures intact. Vascular: Major intracranial vascular flow voids are maintained. Skull and upper cervical spine: Craniocervical junction within normal limits. Bone marrow signal intensity normal. Mild edema at the posterior scalp near the vertex, likely evolving contusion. Sinuses/Orbits: Globes normal soft tissues within normal limits. Mild mucosal thickening noted within the left sphenoid sinus. Paranasal sinuses are otherwise largely clear. No significant mastoid effusion. Other: None. IMPRESSION: 1. Small 3 mm subdural hematoma overlying the posterior right cerebral convexity with extension along the right tentorium, stable. No significant mass effect. 2. Trace residual subarachnoid hemorrhage, most pronounced at the right occipital region. Trace intraventricular hemorrhage likely related to redistribution. 3. 13 mm evolving nonhemorrhagic cortical contusion at the peripheral right temporal lobe. 4. No other acute intracranial abnormality.  No infarct. 5. Underlying moderate chronic microvascular ischemic disease. Electronically Signed   By: Jeannine Boga M.D.   On: 12/15/2020 01:28     Scheduled Meds:  escitalopram  20 mg Oral Daily   gabapentin  300 mg Oral QHS   ketorolac  7.5 mg Intravenous Q8H   levothyroxine  137 mcg Oral QAC breakfast   lidocaine  1 patch Transdermal Daily   meclizine  12.5 mg Oral BID   ondansetron (ZOFRAN) IV  4 mg Intravenous Once   pantoprazole  40 mg Oral Daily   polyethylene glycol  17 g Oral Daily   pramipexole  0.25 mg Oral QHS   propranolol  10 mg Oral BID   senna-docusate  1 tablet Oral BID   Continuous  Infusions:   LOS: 4 days    Time spent: 30min  Domenic Polite, MD [facility]   12/16/2020, 10:38 AM

## 2020-12-16 NOTE — Plan of Care (Signed)

## 2020-12-16 NOTE — Progress Notes (Signed)
Physical Therapy Treatment Patient Details Name: Cheryl Stafford MRN: [id number] DOB: [birth year] Today's Date: 12/16/2020   History of Present Illness 87 y.o. female presents to [facility] hospital on 12/11/2020 after fall at home. Pt found to have R temporal SAH. PMH includes DMII, HTN, hypothyroidism, GERD, hiatal hernia.    PT Comments    Patient reports pain in left side today and dizziness impacting mobility. Requires Min A to get to EOB with increased time due to above. Cues provided for gaze stabilization and to keep eyes opened. Requires Mod A to stand from all surfaces. Pt saturated in urine with purewick not working upon arrival so assisted with pericare. Sp02 dropped to mid 80s on RA with activity, donned 3L and able to maintain in 90s. Encouraged use of IS. Pt agreeable. Will follow.    Recommendations for follow up therapy are one component of a multi-disciplinary discharge planning process, led by the attending physician.  Recommendations may be updated based on patient status, additional functional criteria and insurance authorization.  Follow Up Recommendations  Home health PT     Assistance Recommended at Discharge Intermittent Supervision/Assistance  Equipment Recommendations  Rolling walker (2 wheels)    Recommendations for Other Services       Precautions / Restrictions Precautions Precautions: Fall Precaution Comments: history of falls Restrictions Weight Bearing Restrictions: No     Mobility  Bed Mobility Overal bed mobility: Needs Assistance Bed Mobility: Supine to Sit     Supine to sit: Min assist;HOB elevated     General bed mobility comments: Increased time, assist with trunk to get to EOB. Slow moving due to feeling like the room is spinning, Cues for gaze stabilization. Very slow to mobilize due to pain.    Transfers Overall transfer level: Needs assistance Equipment used: Rolling walker (2 wheels) Transfers: Sit to/from Stand Sit to Stand: Mod  assist           General transfer comment: Assist to power to standing from EOB x1, from chair x1 with cues for anterior weight shift and hand placement.    Ambulation/Gait Ambulation/Gait assistance: Min assist Gait Distance (Feet): 4 Feet Assistive device: Rolling walker (2 wheels)   Gait velocity: decreased Gait velocity interpretation: <1.31 ft/sec, indicative of household ambulator   General Gait Details: Able to take a few guarded steps to get to chair with Sp02 dropping to mid 80s on RA, donned 3L/min 02 [place].   Stairs             Wheelchair Mobility    Modified Rankin (Stroke Patients Only) Modified Rankin (Stroke Patients Only) Pre-Morbid Rankin Score: Slight disability Modified Rankin: Moderately severe disability     Balance Overall balance assessment: Needs assistance Sitting-balance support: No upper extremity supported;Feet supported Sitting balance-Leahy Scale: Fair     Standing balance support: During functional activity Standing balance-Leahy Scale: Poor Standing balance comment: minG with BUE support of RW, external support for walking                            Cognition Arousal/Alertness: Awake/alert Behavior During Therapy: WFL for tasks assessed/performed Overall Cognitive Status: Within Functional Limits for tasks assessed                                          Exercises      General Comments General comments (  skin integrity, edema, etc.): Sp02 dropped to mid 80s on RA, donned 3L and able to maintain in 90s.      Pertinent Vitals/Pain Pain Assessment: Faces Faces Pain Scale: Hurts even more Pain Location: L thorax Pain Descriptors / Indicators: Sore;Sharp Pain Intervention(s): Monitored during session;Limited activity within patient's tolerance;Repositioned    Home Living                          Prior Function            PT Goals (current goals can now be found in the care plan  section) Progress towards PT goals: Not progressing toward goals - comment (due to pain/dizziness)    Frequency    Min 4X/week      PT Plan Current plan remains appropriate    Co-evaluation              AM-PAC PT "6 Clicks" Mobility   Outcome Measure  Help needed turning from your back to your side while in a flat bed without using bedrails?: A Little Help needed moving from lying on your back to sitting on the side of a flat bed without using bedrails?: A Lot Help needed moving to and from a bed to a chair (including a wheelchair)?: A Lot Help needed standing up from a chair using your arms (e.g., wheelchair or bedside chair)?: A Lot Help needed to walk in hospital room?: A Little Help needed climbing 3-5 steps with a railing? : A Little 6 Click Score: 15    End of Session Equipment Utilized During Treatment: Gait belt;Oxygen Activity Tolerance: Patient limited by pain Patient left: in chair;with call bell/phone within reach;with chair alarm set Nurse Communication: Mobility status;Other (comment) (new purewick) PT Visit Diagnosis: Unsteadiness on feet (R26.81);History of falling (Z91.81)     Time: 6503-5465 PT Time Calculation (min) (ACUTE ONLY): 22 min  Charges:  $Therapeutic Activity: 8-22 mins                     Marisa Severin, PT, DPT Acute Rehabilitation Services Pager [phone] Office [phone]      Marguarite Arbour A Sabra Heck 12/16/2020, 12:31 PM

## 2020-12-17 ENCOUNTER — Telehealth: Payer: Self-pay

## 2020-12-17 LAB — BASIC METABOLIC PANEL
Anion gap: 8 (ref 5–15)
BUN: 22 mg/dL (ref 8–23)
CO2: 28 mmol/L (ref 22–32)
Calcium: 8.8 mg/dL — ABNORMAL LOW (ref 8.9–10.3)
Chloride: 99 mmol/L (ref 98–111)
Creatinine, Ser: 0.77 mg/dL (ref 0.44–1.00)
GFR, Estimated: >60 mL/min (ref >60)
Glucose, Bld: 104 mg/dL — ABNORMAL HIGH (ref 70–99)
Potassium: 3.9 mmol/L (ref 3.5–5.1)
Sodium: 135 mmol/L (ref 135–145)

## 2020-12-17 LAB — CBC
HCT: 32.4 % — ABNORMAL LOW (ref 36.0–46.0)
Hemoglobin: 10.7 g/dL — ABNORMAL LOW (ref 12.0–15.0)
MCH: 31.1 pg (ref 26.0–34.0)
MCHC: 33 g/dL (ref 30.0–36.0)
MCV: 94.2 fL (ref 80.0–100.0)
Platelets: 236 10*3/uL (ref 150–400)
RBC: 3.44 MIL/uL — ABNORMAL LOW (ref 3.87–5.11)
RDW: 12.5 % (ref 11.5–15.5)
WBC: 7.2 10*3/uL (ref 4.0–10.5)
nRBC: 0 % (ref 0.0–0.2)

## 2020-12-17 MED ORDER — NAPROXEN 250 MG PO TABS
250.0000 mg | ORAL_TABLET | Freq: Two times a day (BID) | ORAL | Status: AC
  Administered 2020-12-17 (×2): 250 mg via ORAL
  Filled 2020-12-17 (×2): qty 1

## 2020-12-17 NOTE — Telephone Encounter (Signed)
-----   Message from Jackquline Denmark, MD sent at 12/16/2020  5:32 PM EST ----- Regarding: RE: [facility] pt Pl reschedule for Feb 2023 RG   ----- Message ----- From: Osvaldo Angst, CRNA Sent: 12/13/2020   5:49 AM EST To: Jackquline Denmark, MD Subject: [facility] pt                                         Dr. Lyndel Safe,  This pt is scheduled with you on Dec 13.  She is currently hospitalized after a fall resulting in a SAH.  Her procedure will need to be re-scheduled for a later date.  Thanks,  Osvaldo Angst

## 2020-12-17 NOTE — Evaluation (Signed)
Clinical/Bedside Swallow Evaluation Patient Details  Name: Cheryl Stafford MRN: [id number] Date of Birth: 29-Jan-1929  Today's Date: 12/17/2020 Time: SLP Start Time (ACUTE ONLY): 70 SLP Stop Time (ACUTE ONLY): 1500 SLP Time Calculation (min) (ACUTE ONLY): 35 min  Past Medical History:  Past Medical History:  Diagnosis Date   Esophageal dysphagia    Functional diarrhea    GERD (gastroesophageal reflux disease)    Hypertension    Hypothyroid    IDA (iron deficiency anemia)    Prediabetes 12/12/2020   Past Surgical History:  Past Surgical History:  Procedure Laterality Date   ABDOMINAL HYSTERECTOMY     COLONOSCOPY  06/07/2012   Colonic polyp status post polypectomy, Pancolonic diverticulosis redominantly in the sigmoid colon. Examination was limited due to qualitiy of preparation.   ESOPHAGOGASTRODUODENOSCOPY  07/30/2016   Highly torturous esophagus suggestive of motility disorder/presbyesophagus. Schatzki's ring status post esophageal dilatation. Large hiatal hernia. Mild gastritis.    TOTAL KNEE ARTHROPLASTY  01/02/2015   HPI:  87yo female admitted 12/11/20 after falling backward, striking her head. PMH: DM2, anemia, HTN, hypothyroidism, GERD, hiatal hernia, Schatzki's, tortuous esophagus, dilation x2. CTHead = tiny right temporal SAH. MRI = 42mm SDH over posterior right cerebral convexity    Assessment / Plan / Recommendation  Clinical Impression  Pt seen at bedside for assessment of swallow function and safety. She has a long standing history of multiple esophageal issues, and was scheduled to have an OP esophageal examination tomorrow, prior to admit. Pt presents with adequate orofacial strength, ROM, and symmetry. No obvious oral issues or overt s/s aspiration given thin liquid, puree, or solid textures. Pt reports globus sensation frequently, and states she has ongoing pain in the substernal area. Will continue regular diet and thin liquids. Pt was encouraged to choose soft solids  and begin meals with a warm liquid (broth, hot tea, coffee, etc). She was provided with written behavioral and dietary strategies for the management of esophageal dysmotility, and was provided the opportunity to ask questions. No further ST intervention is recommended at this time. Recommend continued follow up with GI for esophageal issues. RN and MD informed. ST signing off.  Behavioral and Dietary Strategies for Management of Esophageal Dysmotility 1. Take reflux medications 30+ minutes before other po intake, in the morning 2. Begin meals with warm beverage 3. Eat smaller more frequent meals 4. Eat slowly, taking small bites and sips 5. Alternate solids and liquids 6. Avoid foods/liquids that increase acid production 7. Sit upright during and for 30+ minutes after meals to facilitate esophageal clearing   SLP Visit Diagnosis: Dysphagia, unspecified (R13.10)    Aspiration Risk  Mild aspiration risk    Diet Recommendation Regular;Thin liquid   Liquid Administration via: Cup;Straw Medication Administration: Whole meds with liquid Supervision: Patient able to self feed Compensations: Minimize environmental distractions;Slow rate;Small sips/bites;Follow solids with liquid Postural Changes: Seated upright at 90 degrees;Remain upright for at least 30 minutes after po intake    Other  Recommendations Recommended Consults: Consider esophageal assessment Oral Care Recommendations: Oral care BID    Recommendations for follow up therapy are one component of a multi-disciplinary discharge planning process, led by the attending physician.  Recommendations may be updated based on patient status, additional functional criteria and insurance authorization.  Follow up Recommendations No SLP follow up      Assistance Recommended at Discharge None  Functional Status Assessment Patient has not had a recent decline in their functional status      Prognosis  Swallow Study   General Date  of Onset: 12/11/20 HPI: 87yo female admitted 12/11/20 after falling backward, striking her head. PMH: DM2, anemia, HTN, hypothyroidism, GERD, hiatal hernia, Schatzki's, tortuous esophagus, dilation x2. CTHead = tiny right temporal SAH. MRI = 31mm SDH over posterior right cerebral convexity Type of Study: Bedside Swallow Evaluation Previous Swallow Assessment: none Diet Prior to this Study: Regular;Thin liquids Temperature Spikes Noted: No Respiratory Status: Nasal cannula History of Recent Intubation: No Behavior/Cognition: Alert;Cooperative;Pleasant mood Oral Cavity Assessment: Within Functional Limits Oral Care Completed by SLP: No Oral Cavity - Dentition: Adequate natural dentition Vision: Functional for self-feeding Self-Feeding Abilities: Able to feed self Patient Positioning: Upright in bed Baseline Vocal Quality: Normal Volitional Cough: Strong Volitional Swallow: Able to elicit    Oral/Motor/Sensory Function Overall Oral Motor/Sensory Function: Within functional limits   Ice Chips Ice chips: Not tested   Thin Liquid Thin Liquid: Within functional limits Presentation: Straw    Nectar Thick Nectar Thick Liquid: Not tested   Honey Thick Honey Thick Liquid: Not tested   Puree Puree: Within functional limits Presentation: Spoon   Solid     Solid: Within functional limits Presentation: Tasley B. Quentin Ore, [facility], [place] Speech Language Pathologist Office: [phone]  Shonna Chock 12/17/2020,3:16 PM

## 2020-12-17 NOTE — Progress Notes (Signed)
Physical Therapy Treatment Patient Details Name: Cheryl Stafford MRN: [id number] DOB: 01-25-1929 Today's Date: 12/17/2020   History of Present Illness 87 y.o. female presents to [facility] hospital on 12/11/2020 after fall at home. Pt found to have R temporal SAH. PMH includes DMII, HTN, hypothyroidism, GERD, hiatal hernia.    PT Comments    Pt reports head feels "heavy" today, and her L side is hurting with coughing/moving, but motivated to progress mobility. Pt ambulatory for room distance, requires increased time at each transition given dizziness and requiring up to mod physical assist. Pt benefits from gaze stabilization during all mobility to manage dizziness. PT updated recommendation to reflect IPR, pt has great support post-acutely and PT feels pt needs more intensive therapy prior to d/c home.    Recommendations for follow up therapy are one component of a multi-disciplinary discharge planning process, led by the attending physician.  Recommendations may be updated based on patient status, additional functional criteria and insurance authorization.  Follow Up Recommendations  Acute inpatient rehab (3hours/day)     Assistance Recommended at Discharge Frequent or constant Supervision/Assistance  Equipment Recommendations  Rolling walker (2 wheels)    Recommendations for Other Services       Precautions / Restrictions Precautions Precautions: Fall Precaution Comments: history of falls, dizziness     Mobility  Bed Mobility Overal bed mobility: Needs Assistance Bed Mobility: Supine to Sit Rolling: Min guard Sidelying to sit: Mod assist;HOB elevated       General bed mobility comments: assist for truncal elevation, LE lowering over EOB, and scooting to EOB with assist of bed pad. Cues for gaze stabilization, increased time at each positional change.    Transfers Overall transfer level: Needs assistance Equipment used: Rolling walker (2 wheels) Transfers: Sit to/from Stand Sit  to Stand: Min assist;From elevated surface           General transfer comment: Assist for rise, steady. Cues for safe hand placement when rising.    Ambulation/Gait Ambulation/Gait assistance: Min assist Gait Distance (Feet): 20 Feet Assistive device: Rolling walker (2 wheels) Gait Pattern/deviations: Step-through pattern;Decreased stride length;Trunk flexed Gait velocity: decr   Pre-gait activities: march in place prior to initiating gait, gaze stabilization during gait General Gait Details: assist to steady, guide pt and RW, cues for upright posture and directing RW.   Stairs             Wheelchair Mobility    Modified Rankin (Stroke Patients Only) Modified Rankin (Stroke Patients Only) Pre-Morbid Rankin Score: Slight disability Modified Rankin: Moderately severe disability     Balance Overall balance assessment: Needs assistance Sitting-balance support: Feet supported;Single extremity supported Sitting balance-Leahy Scale: Fair Sitting balance - Comments: initially poor, transitioning to fair with UE support   Standing balance support: During functional activity Standing balance-Leahy Scale: Poor Standing balance comment: minG with BUE support of RW, external support for walking                            Cognition Arousal/Alertness: Awake/alert Behavior During Therapy: WFL for tasks assessed/performed Overall Cognitive Status: Impaired/Different from baseline Area of Impairment: Following commands;Problem solving                       Following Commands: Follows one step commands with increased time     Problem Solving: Slow processing;Difficulty sequencing;Requires verbal cues;Requires tactile cues          Exercises  General Comments General comments (skin integrity, edema, etc.): SPO2 drop to 87-88% on RA during gait, placed back on 2LO2 to return to 90s.      Pertinent Vitals/Pain Pain Assessment: Faces Faces Pain  Scale: Hurts even more Pain Location: L thorax Pain Descriptors / Indicators: Sore;Sharp Pain Intervention(s): Limited activity within patient's tolerance;Monitored during session;Repositioned;Other (comment) (encouraged bracing with pillow with coughing)    Home Living                          Prior Function            PT Goals (current goals can now be found in the care plan section) Acute Rehab PT Goals Patient Stated Goal: to go home with family assistance PT Goal Formulation: With patient Time For Goal Achievement: 12/27/20 Potential to Achieve Goals: Good Progress towards PT goals: Progressing toward goals    Frequency    Min 4X/week      PT Plan Current plan remains appropriate    Co-evaluation              AM-PAC PT "6 Clicks" Mobility   Outcome Measure  Help needed turning from your back to your side while in a flat bed without using bedrails?: A Little Help needed moving from lying on your back to sitting on the side of a flat bed without using bedrails?: A Lot Help needed moving to and from a bed to a chair (including a wheelchair)?: A Lot Help needed standing up from a chair using your arms (e.g., wheelchair or bedside chair)?: A Little Help needed to walk in hospital room?: A Little Help needed climbing 3-5 steps with a railing? : A Lot 6 Click Score: 15    End of Session Equipment Utilized During Treatment: Gait belt;Oxygen Activity Tolerance: Patient limited by pain Patient left: in chair;with call bell/phone within reach;with chair alarm set Nurse Communication: Mobility status;Other (comment) (new purewick) PT Visit Diagnosis: Unsteadiness on feet (R26.81);History of falling (Z91.81)     Time: 7494-4967 PT Time Calculation (min) (ACUTE ONLY): 21 min  Charges:  $Gait Training: 8-22 mins                     Stacie Glaze, PT DPT Acute Rehabilitation Services Pager [phone]  Office [phone]    [place] 12/17/2020, 9:57  AM

## 2020-12-17 NOTE — Progress Notes (Signed)
PROGRESS NOTE    Wendolyn Raso  XQJ:[id number] DOB: 11-16-1929 DOA: 12/11/2020 PCP: Nicoletta Dress, MD  Brief Narrative: 89/F with history of type 2 diabetes mellitus, iron deficiency anemia, hypertension, hypothyroidism, GERD, hiatal hernia fell backwards while picking up a package, struck her head, had small laceration with bleeding from her scalp, was [facility] to [facility] ED. -CT head noted tiny right temporal SAH, EDP discussed with neurosurgery, no further work-up recommended. -Subsequently patient had dizziness, nausea and an episode of vomiting, TRH was consulted for admission, chest x-ray noted cardiomegaly   Assessment & Plan:   Subarachnoid hemorrhage following injury with concussion Dizziness  -Following mechanical fall - case d/w Neurosurgery on admission, no workup recommended - Postconcussion symptoms  -supportive care -MRI for worsening dizziness 12/9-noted small SDH, trace residual SAH and contusion, nonhemorrhagic at the right temporal lobe  -supportive care, Tylenol, meclizine -Continues to be bothered by residual dizziness, PT eval today, agree that rehab may be a good option  Left lateral chest wall pain/point tenderness Musculoskeletal-suspect rib contusion/muscle injury -Following mechanical fall, x-ray rib series negative -Slowly improving -lidocaine patch, improving with Toradol, add naproxen x2 doses -Incentive spirometry -Increase activity as tolerated  Cardiomegaly -Noted on x-ray, no symptoms of CHF, pericardial effusion -2D echo noted preserved EF, grade 1 diastolic dysfunction , no significant valvular disease, no pericardial effusion  Hypertension -continue propranolol, hold amlodipine and ARB -Blood pressure is more stable now  Hypothyroidism -Continue Synthroid  DVT prophylaxis: SCDs Code Status: Full Code Family Communication: NO family at bedside, updated daughter 12/7, 12/9, 12/12 Disposition Plan:  Status is: Inpatient   Consultants:   D/w neurosurgery on admission  Procedures:   Antimicrobials:    Subjective: -Dizziness but mild, continues to be bothered by left chest wall pain and tenderness  Objective: Vitals:   12/17/20 0700 12/17/20 0800 12/17/20 0804 12/17/20 0845  BP:   123/80   Pulse: (!) 48 (!) 47 (!) 54 (!) 54  Resp: 17 13  16   Temp:   97.8 F (36.6 C)   TempSrc:   Oral   SpO2: 98% 97% 98% 98%  Weight:        Intake/Output Summary (Last 24 hours) at 12/17/2020 1146 Last data filed at 12/17/2020 0314 Gross per 24 hour  Intake --  Output 200 ml  Net -200 ml   Filed Weights   12/13/20 0648  Weight: 59.1 kg    Examination:  Pleasant elderly female sitting up in bed, slightly uncomfortable appearing, AAOx3 HEENT: No JVD CVS: S1-S2, regular rate rhythm Lungs: Decreased breath sounds to bases, lateral chest wall tenderness posteriorly and mid axillary line Abdomen: Soft, nontender, bowel sounds present Extremities: No edema  Skin: No rash on exposed skin Psych: Appropriate mood and affect   Data Reviewed:   CBC: Recent Labs  Lab 12/11/20 0050 12/12/20 0802 12/13/20 0339 12/15/20 0313 12/16/20 0246 12/17/20 0448  WBC 13.1* 9.2 8.4 8.4 7.5 7.2  NEUTROABS 11.5* 6.7 5.3  --   --   --   HGB 11.7* 10.8* 9.6* 10.2* 10.6* 10.7*  HCT 35.5* 33.3* 30.0* 31.5* 33.0* 32.4*  MCV 94.2 95.1 97.1 94.6 94.6 94.2  PLT 229 218 180 201 227 448   Basic Metabolic Panel: Recent Labs  Lab 12/12/20 0802 12/13/20 0339 12/15/20 0313 12/16/20 0246 12/17/20 0448  NA 133* 134* 134* 133* 135  K 4.0 4.1 4.1 3.8 3.9  CL 98 107 100 99 99  CO2 29 22 26 28 28   GLUCOSE 114* 107*  104* 102* 104*  BUN 15 16 10 12 22   CREATININE 0.84 0.75 0.82 0.82 0.77  CALCIUM 8.9 8.1* 8.7* 8.8* 8.8*  MG 2.4 2.0  --   --   --    GFR: Estimated Creatinine Clearance: 40.2 mL/min (by C-G formula based on SCr of 0.77 mg/dL). Liver Function Tests: Recent Labs  Lab 12/11/20 0050 12/12/20 0802 12/13/20 0339  AST 22  19 13*  ALT 15 14 9   ALKPHOS 46 39 34*  BILITOT 0.5 0.8 0.5  PROT 6.1* 5.8* 4.9*  ALBUMIN 3.4* 3.3* 2.5*   No results for input(s): LIPASE, AMYLASE in the last 168 hours. No results for input(s): AMMONIA in the last 168 hours. Coagulation Profile: Recent Labs  Lab 12/11/20 0050  INR 1.1   Cardiac Enzymes: No results for input(s): CKTOTAL, CKMB, CKMBINDEX, TROPONINI in the last 168 hours. BNP (last 3 results) No results for input(s): PROBNP in the last 8760 hours. HbA1C: No results for input(s): HGBA1C in the last 72 hours.  CBG: Recent Labs  Lab 12/12/20 1617 12/12/20 2235 12/13/20 0750 12/13/20 1125 12/13/20 1539  GLUCAP 131* 120* 135* 120* 105*   Lipid Profile: No results for input(s): CHOL, HDL, LDLCALC, TRIG, CHOLHDL, LDLDIRECT in the last 72 hours. Thyroid Function Tests: No results for input(s): TSH, T4TOTAL, FREET4, T3FREE, THYROIDAB in the last 72 hours. Anemia Panel: No results for input(s): VITAMINB12, FOLATE, FERRITIN, TIBC, IRON, RETICCTPCT in the last 72 hours. Urine analysis:    Component Value Date/Time   COLORURINE YELLOW 12/12/2020 [place] 12/12/2020 0644   LABSPEC 1.015 12/12/2020 0644   PHURINE 6.5 12/12/2020 [place] 12/12/2020 0644   HGBUR NEGATIVE 12/12/2020 0644   BILIRUBINUR NEGATIVE 12/12/2020 0644   KETONESUR NEGATIVE 12/12/2020 0644   PROTEINUR NEGATIVE 12/12/2020 0644   NITRITE NEGATIVE 12/12/2020 0644   LEUKOCYTESUR NEGATIVE 12/12/2020 0644   Sepsis Labs: @LABRCNTIP (procalcitonin:4,lacticidven:4)  ) Recent Results (from the past 240 hour(s))  Resp Panel by RT-PCR (Flu A&B, Covid) Nasopharyngeal Swab     Status: None   Collection Time: 12/12/20  3:10 AM   Specimen: Nasopharyngeal Swab; Nasopharyngeal(NP) swabs in vial transport medium  Result Value Ref Range Status   SARS Coronavirus 2 by RT PCR NEGATIVE NEGATIVE Final    Comment: (NOTE) SARS-CoV-2 target nucleic acids are NOT  DETECTED.  The SARS-CoV-2 RNA is generally detectable in upper respiratory specimens during the acute phase of infection. The lowest concentration of SARS-CoV-2 viral copies this assay can detect is 138 copies/mL. A negative result does not preclude SARS-Cov-2 infection and should not be used as the sole basis for treatment or other patient management decisions. A negative result may occur with  improper specimen collection/handling, submission of specimen other than nasopharyngeal swab, presence of viral mutation(s) within the areas targeted by this assay, and inadequate number of viral copies(<138 copies/mL). A negative result must be combined with clinical observations, patient history, and epidemiological information. The expected result is Negative.  Fact Sheet for Patients:  EntrepreneurPulse.com.au  Fact Sheet for Healthcare Providers:  IncredibleEmployment.be  This test is no t yet approved or cleared by the [place] FDA and  has been authorized for detection and/or diagnosis of SARS-CoV-2 by FDA under an Emergency Use Authorization (EUA). This EUA will remain  in effect (meaning this test can be used) for the duration of the COVID-19 declaration under Section 564(b)(1) of the Act, 21 U.S.C.section 360bbb-3(b)(1), unless the authorization is terminated  or revoked sooner.  Influenza A by PCR NEGATIVE NEGATIVE Final   Influenza B by PCR NEGATIVE NEGATIVE Final    Comment: (NOTE) The Xpert Xpress SARS-CoV-2/FLU/RSV plus assay is intended as an aid in the diagnosis of influenza from Nasopharyngeal swab specimens and should not be used as a sole basis for treatment. Nasal washings and aspirates are unacceptable for Xpert Xpress SARS-CoV-2/FLU/RSV testing.  Fact Sheet for Patients: EntrepreneurPulse.com.au  Fact Sheet for Healthcare Providers: IncredibleEmployment.be  This test is not yet  approved or cleared by the [place] FDA and has been authorized for detection and/or diagnosis of SARS-CoV-2 by FDA under an Emergency Use Authorization (EUA). This EUA will remain in effect (meaning this test can be used) for the duration of the COVID-19 declaration under Section 564(b)(1) of the Act, 21 U.S.C. section 360bbb-3(b)(1), unless the authorization is terminated or revoked.  Performed at [facility], [place] [address] [place], [place] [address]   MRSA Next Gen by PCR, Nasal     Status: None   Collection Time: 12/12/20  9:11 PM   Specimen: Nasal Mucosa; Nasal Swab  Result Value Ref Range Status   MRSA by PCR Next Gen NOT DETECTED NOT DETECTED Final    Comment: (NOTE) The GeneXpert MRSA Assay (FDA approved for NASAL specimens only), is one component of a comprehensive MRSA colonization surveillance program. It is not intended to diagnose MRSA infection nor to guide or monitor treatment for MRSA infections. Test performance is not FDA approved in patients less than 30 years old. Performed at [facility], [place] [address], [place],  [address]     Radiology Studies: No results found.   Scheduled Meds:  escitalopram  20 mg Oral Daily   gabapentin  300 mg Oral QHS   levothyroxine  137 mcg Oral QAC breakfast   lidocaine  1 patch Transdermal Daily   meclizine  12.5 mg Oral BID   naproxen  250 mg Oral BID WC   ondansetron (ZOFRAN) IV  4 mg Intravenous Once   pantoprazole  40 mg Oral Daily   polyethylene glycol  17 g Oral Daily   pramipexole  0.25 mg Oral QHS   propranolol  10 mg Oral BID   senna-docusate  1 tablet Oral BID   Continuous Infusions:   LOS: 5 days    Time spent: 4min  Domenic Polite, MD [facility]   12/17/2020, 11:46 AM

## 2020-12-17 NOTE — Progress Notes (Addendum)
Inpatient Rehabilitation Admissions Coordinator   I met at bedside with patient and a daughter. We discussed goals and expectations of a possible CIR admit and she is in agreement. I will follow up tomorrow with possible bed availability over the next 1 to 2 days. She is a great CIR candidate.  Danne Baxter, RN, MSN Rehab Admissions Coordinator [phone] 12/17/2020 3:45 PM

## 2020-12-17 NOTE — Progress Notes (Signed)
Inpatient Rehab Admissions Coordinator:   Per therapy recommendations,  patient was screened for CIR candidacy by Clemens Catholic, MS, CCC-SLP At this time, Pt. Appears to be an appropriate candidate. Pt. is a potential candidate for CIR. I will place  order for rehab consult per protocol for full assessment. Please contact me any with questions  Clemens Catholic, [place], [place] Admissions Coordinator  [phone] ([place]) [phone] (office)

## 2020-12-17 NOTE — Plan of Care (Signed)

## 2020-12-17 NOTE — Telephone Encounter (Signed)
Left message for pt to call back  °

## 2020-12-18 ENCOUNTER — Encounter (HOSPITAL_COMMUNITY): Payer: Self-pay | Admitting: Physical Medicine and Rehabilitation

## 2020-12-18 ENCOUNTER — Other Ambulatory Visit: Payer: Self-pay

## 2020-12-18 ENCOUNTER — Encounter: Payer: Medicare Other | Admitting: Gastroenterology

## 2020-12-18 ENCOUNTER — Inpatient Hospital Stay (HOSPITAL_COMMUNITY)
Admission: RE | Admit: 2020-12-18 | Discharge: 2020-12-28 | DRG: 946 | Disposition: A | Discharge status: Home Health | Payer: Medicare Other | Source: Intra-hospital | Attending: Physical Medicine and Rehabilitation | Admitting: Physical Medicine and Rehabilitation

## 2020-12-18 ENCOUNTER — Inpatient Hospital Stay (HOSPITAL_COMMUNITY): Payer: Medicare Other

## 2020-12-18 DIAGNOSIS — S066XAA Traumatic subarachnoid hemorrhage with loss of consciousness status unknown, initial encounter: Secondary | ICD-10-CM | POA: Diagnosis present

## 2020-12-18 DIAGNOSIS — G2581 Restless legs syndrome: Secondary | ICD-10-CM | POA: Diagnosis present

## 2020-12-18 DIAGNOSIS — Z79890 Hormone replacement therapy (postmenopausal): Secondary | ICD-10-CM | POA: Diagnosis not present

## 2020-12-18 DIAGNOSIS — D509 Iron deficiency anemia, unspecified: Secondary | ICD-10-CM | POA: Diagnosis present

## 2020-12-18 DIAGNOSIS — E119 Type 2 diabetes mellitus without complications: Secondary | ICD-10-CM | POA: Diagnosis present

## 2020-12-18 DIAGNOSIS — Z885 Allergy status to narcotic agent status: Secondary | ICD-10-CM | POA: Diagnosis not present

## 2020-12-18 DIAGNOSIS — S066X1S Traumatic subarachnoid hemorrhage with loss of consciousness of 30 minutes or less, sequela: Secondary | ICD-10-CM | POA: Diagnosis not present

## 2020-12-18 DIAGNOSIS — R001 Bradycardia, unspecified: Secondary | ICD-10-CM | POA: Diagnosis not present

## 2020-12-18 DIAGNOSIS — K5900 Constipation, unspecified: Secondary | ICD-10-CM | POA: Diagnosis present

## 2020-12-18 DIAGNOSIS — K219 Gastro-esophageal reflux disease without esophagitis: Secondary | ICD-10-CM | POA: Diagnosis present

## 2020-12-18 DIAGNOSIS — S066X1D Traumatic subarachnoid hemorrhage with loss of consciousness of 30 minutes or less, subsequent encounter: Secondary | ICD-10-CM

## 2020-12-18 DIAGNOSIS — M545 Low back pain, unspecified: Secondary | ICD-10-CM | POA: Diagnosis not present

## 2020-12-18 DIAGNOSIS — S066X0S Traumatic subarachnoid hemorrhage without loss of consciousness, sequela: Secondary | ICD-10-CM | POA: Diagnosis not present

## 2020-12-18 DIAGNOSIS — S066X0D Traumatic subarachnoid hemorrhage without loss of consciousness, subsequent encounter: Secondary | ICD-10-CM | POA: Diagnosis not present

## 2020-12-18 DIAGNOSIS — E039 Hypothyroidism, unspecified: Secondary | ICD-10-CM | POA: Diagnosis present

## 2020-12-18 DIAGNOSIS — K5901 Slow transit constipation: Secondary | ICD-10-CM | POA: Diagnosis not present

## 2020-12-18 DIAGNOSIS — W1830XD Fall on same level, unspecified, subsequent encounter: Secondary | ICD-10-CM

## 2020-12-18 DIAGNOSIS — I10 Essential (primary) hypertension: Secondary | ICD-10-CM | POA: Diagnosis present

## 2020-12-18 DIAGNOSIS — S066X0A Traumatic subarachnoid hemorrhage without loss of consciousness, initial encounter: Secondary | ICD-10-CM

## 2020-12-18 DIAGNOSIS — Z79899 Other long term (current) drug therapy: Secondary | ICD-10-CM

## 2020-12-18 DIAGNOSIS — Z96659 Presence of unspecified artificial knee joint: Secondary | ICD-10-CM | POA: Diagnosis present

## 2020-12-18 MED ORDER — ACETAMINOPHEN 325 MG PO TABS
650.0000 mg | ORAL_TABLET | Freq: Four times a day (QID) | ORAL | Status: DC | PRN
Start: 2020-12-18 — End: 2021-01-21

## 2020-12-18 MED ORDER — MECLIZINE HCL 25 MG PO TABS
12.5000 mg | ORAL_TABLET | Freq: Two times a day (BID) | ORAL | Status: DC
  Administered 2020-12-18 – 2020-12-19 (×2): 12.5 mg via ORAL
  Filled 2020-12-18 (×2): qty 1

## 2020-12-18 MED ORDER — LIDOCAINE 5 % EX PTCH
1.0000 | MEDICATED_PATCH | Freq: Every day | CUTANEOUS | Status: DC
  Administered 2020-12-19 – 2020-12-25 (×7): 1 via TRANSDERMAL
  Filled 2020-12-18 (×7): qty 1

## 2020-12-18 MED ORDER — ONDANSETRON HCL 4 MG/2ML IJ SOLN: 4.0000 mg | Freq: Four times a day (QID) | INTRAMUSCULAR | Status: DC | PRN

## 2020-12-18 MED ORDER — ESCITALOPRAM OXALATE 10 MG PO TABS
20.0000 mg | ORAL_TABLET | Freq: Every day | ORAL | Status: DC
  Administered 2020-12-19 – 2020-12-28 (×10): 20 mg via ORAL
  Filled 2020-12-18 (×10): qty 2

## 2020-12-18 MED ORDER — LEVOTHYROXINE SODIUM 25 MCG PO TABS
137.0000 ug | ORAL_TABLET | Freq: Every day | ORAL | Status: DC
  Administered 2020-12-19 – 2020-12-28 (×9): 137 ug via ORAL
  Filled 2020-12-18 (×11): qty 1

## 2020-12-18 MED ORDER — ACETAMINOPHEN 325 MG PO TABS
650.0000 mg | ORAL_TABLET | Freq: Four times a day (QID) | ORAL | Status: DC | PRN
  Administered 2020-12-19 – 2020-12-26 (×4): 650 mg via ORAL
  Filled 2020-12-18 (×4): qty 2

## 2020-12-18 MED ORDER — PROPRANOLOL HCL 10 MG PO TABS
10.0000 mg | ORAL_TABLET | Freq: Two times a day (BID) | ORAL | Status: DC
  Administered 2020-12-18 – 2020-12-20 (×4): 10 mg via ORAL
  Filled 2020-12-18 (×4): qty 1

## 2020-12-18 MED ORDER — NAPROXEN 250 MG PO TABS: 250.0000 mg | ORAL_TABLET | Freq: Two times a day (BID) | ORAL | 0 refills | Status: DC

## 2020-12-18 MED ORDER — ONDANSETRON HCL 4 MG PO TABS
4.0000 mg | ORAL_TABLET | Freq: Four times a day (QID) | ORAL | Status: DC | PRN
  Administered 2020-12-20 – 2020-12-25 (×2): 4 mg via ORAL
  Filled 2020-12-18 (×2): qty 1

## 2020-12-18 MED ORDER — SENNOSIDES-DOCUSATE SODIUM 8.6-50 MG PO TABS
1.0000 | ORAL_TABLET | Freq: Two times a day (BID) | ORAL | Status: DC
Start: 2020-12-18 — End: 2021-01-21

## 2020-12-18 MED ORDER — POLYETHYLENE GLYCOL 3350 17 G PO PACK: 17.0000 g | PACK | Freq: Two times a day (BID) | ORAL | Status: DC

## 2020-12-18 MED ORDER — POLYETHYLENE GLYCOL 3350 17 G PO PACK
17.0000 g | PACK | Freq: Two times a day (BID) | ORAL | Status: DC
  Administered 2020-12-18 – 2020-12-25 (×10): 17 g via ORAL
  Filled 2020-12-18 (×17): qty 1

## 2020-12-18 MED ORDER — NAPROXEN 250 MG PO TABS: 250.0000 mg | ORAL_TABLET | Freq: Two times a day (BID) | ORAL | Status: DC

## 2020-12-18 MED ORDER — MECLIZINE HCL 12.5 MG PO TABS
12.5000 mg | ORAL_TABLET | Freq: Two times a day (BID) | ORAL | 0 refills | Status: DC
Start: 2020-12-18 — End: 2020-12-28

## 2020-12-18 MED ORDER — SENNOSIDES-DOCUSATE SODIUM 8.6-50 MG PO TABS
1.0000 | ORAL_TABLET | Freq: Two times a day (BID) | ORAL | Status: DC
  Administered 2020-12-18 – 2020-12-28 (×18): 1 via ORAL
  Filled 2020-12-18 (×19): qty 1

## 2020-12-18 MED ORDER — TRAMADOL HCL 50 MG PO TABS
50.0000 mg | ORAL_TABLET | Freq: Four times a day (QID) | ORAL | Status: DC | PRN
  Administered 2020-12-18 – 2020-12-26 (×7): 50 mg via ORAL
  Filled 2020-12-18 (×7): qty 1

## 2020-12-18 MED ORDER — LIDOCAINE 5 % EX PTCH: 1.0000 | MEDICATED_PATCH | Freq: Every day | CUTANEOUS | 0 refills | Status: DC

## 2020-12-18 MED ORDER — PRAMIPEXOLE DIHYDROCHLORIDE 0.25 MG PO TABS
0.2500 mg | ORAL_TABLET | Freq: Every day | ORAL | Status: DC
  Administered 2020-12-18 – 2020-12-27 (×10): 0.25 mg via ORAL
  Filled 2020-12-18 (×11): qty 1

## 2020-12-18 MED ORDER — GABAPENTIN 300 MG PO CAPS
300.0000 mg | ORAL_CAPSULE | Freq: Every day | ORAL | Status: DC
  Administered 2020-12-18 – 2020-12-27 (×10): 300 mg via ORAL
  Filled 2020-12-18 (×10): qty 1

## 2020-12-18 MED ORDER — NAPROXEN 250 MG PO TABS
250.0000 mg | ORAL_TABLET | Freq: Two times a day (BID) | ORAL | Status: DC
  Administered 2020-12-18: 250 mg via ORAL
  Filled 2020-12-18: qty 1

## 2020-12-18 MED ORDER — TRAMADOL HCL 50 MG PO TABS: 50.0000 mg | ORAL_TABLET | Freq: Four times a day (QID) | ORAL | 0 refills | Status: DC | PRN

## 2020-12-18 MED ORDER — POLYETHYLENE GLYCOL 3350 17 G PO PACK: 17.0000 g | PACK | Freq: Every day | ORAL | 0 refills | Status: DC

## 2020-12-18 MED ORDER — ACETAMINOPHEN 650 MG RE SUPP: 650.0000 mg | Freq: Four times a day (QID) | RECTAL | Status: DC | PRN

## 2020-12-18 MED ORDER — PANTOPRAZOLE SODIUM 40 MG PO TBEC
40.0000 mg | DELAYED_RELEASE_TABLET | Freq: Every day | ORAL | Status: DC
  Administered 2020-12-19 – 2020-12-28 (×10): 40 mg via ORAL
  Filled 2020-12-18 (×10): qty 1

## 2020-12-18 NOTE — Progress Notes (Signed)
Physical Medicine and Rehabilitation Admission H&P        Chief Complaint  Patient presents with   Fall  :with R temporal [place]     HPI: Cheryl Stafford is a 87 year old right-handed female with history of hypertension, hypothyroidism, iron deficiency anemia, type 2 diabetes mellitus, GERD.  Per chart review patient lives alone.  1 level home 3 steps to entry.  Ambulates with a cane with reports of 4 falls in the last 6 months.  She plans to be discharged to home with her daughter.  Presented 12/11/2020 after a fall while attempting to pick up a package when she fell backwards striking her head.  There was associated headache with nausea but no vomiting.  She denied loss of consciousness.  Cranial CT scan showed minimal right temporal subarachnoid hemorrhage.  No mass-effect or midline shift.  No acute traumatic cervical spine pathology.  Admission chemistries unremarkable sodium 133 glucose 150, WBC 13,100, troponin negative, urinalysis negative nitrite, hemoglobin A1c 5.6.  Neurosurgery Dr. Venetia Constable consulted advised conservative care with follow-up MRI 12/14/2020 showing small 3 mm subdural hematoma overlying the posterior right cerebral convexity with extension along the right tentorium no significant mass-effect.  Trace residual subarachnoid hemorrhage most pronounced at the right occipital region.  13 mm evolving nonhemorrhagic cortical contusion at the peripheral right temporal lobe.  No other acute intracranial abnormality.  Echocardiogram with ejection fraction of 60 to 65% no wall motion abnormalities grade 1 diastolic dysfunction.  Patient did have some complaints of left lateral chest wall tenderness felt to be musculoskeletal in nature x-ray rib series negative CTA chest did show a posterior rib fracture advised conservative care.  Follow-up speech therapy for any signs of dysphagia patient with longstanding history of multiple esophageal issues scheduled to have outpatient esophageal  examination.  Found to have no obvious oral issues or overt aspiration maintain on a regular consistency diet.  Therapy evaluations completed due to patient decreased functional ability altered mental status was admitted for a comprehensive rehab program.   Pt reports pinpoint L anterior rib pain/Lateral wall tenderness.  Dizzy when turns head to Right- not to left.  LBM 1 week ago- had miralax yesterday and today.  Voiding OK.  O2- new; used cane at home.      Review of Systems  Constitutional:  Negative for chills and fever.  HENT:  Negative for hearing loss.   Eyes:  Negative for blurred vision and double vision.  Respiratory:  Negative for cough and shortness of breath.   Cardiovascular:  Positive for leg swelling. Negative for chest pain and palpitations.  Gastrointestinal:  Positive for constipation and nausea. Negative for heartburn and vomiting.       GERD  Genitourinary:  Negative for dysuria, flank pain and hematuria.  Musculoskeletal:  Positive for falls, joint pain and myalgias.  Skin:  Negative for rash.  Neurological:  Positive for dizziness.  Psychiatric/Behavioral:  Positive for depression. The patient has insomnia.   All other systems reviewed and are negative.     Past Medical History:  Diagnosis Date   Esophageal dysphagia     Functional diarrhea     GERD (gastroesophageal reflux disease)     Hypertension     Hypothyroid     IDA (iron deficiency anemia)     Prediabetes 12/12/2020         Past Surgical History:  Procedure Laterality Date   ABDOMINAL HYSTERECTOMY       COLONOSCOPY   06/07/2012  Colonic polyp status post polypectomy, Pancolonic diverticulosis redominantly in the sigmoid colon. Examination was limited due to qualitiy of preparation.   ESOPHAGOGASTRODUODENOSCOPY   07/30/2016    Highly torturous esophagus suggestive of motility disorder/presbyesophagus. Schatzki's ring status post esophageal dilatation. Large hiatal hernia. Mild gastritis.     TOTAL KNEE ARTHROPLASTY   01/02/2015         Family History  Problem Relation Age of Onset   Colon cancer Neg Hx     Stomach cancer Neg Hx      Social History:  reports that she has never smoked. She has never used smokeless tobacco. She reports that she does not drink alcohol and does not use drugs. Allergies:      Allergies  Allergen Reactions   Codeine Nausea Only          Medications Prior to Admission  Medication Sig Dispense Refill   amLODipine-olmesartan (AZOR) 5-40 MG tablet Take 1 tablet by mouth daily.       CALCIUM PO Take 1 tablet by mouth daily.       colesevelam (WELCHOL) 625 MG tablet Take 1,875 mg by mouth 2 (two) times daily with a meal.       escitalopram (LEXAPRO) 20 MG tablet Take 20 mg by mouth daily.       ferrous sulfate 324 MG TBEC Take 324 mg by mouth daily.       gabapentin (NEURONTIN) 100 MG capsule Take 100 mg by mouth daily.       gabapentin (NEURONTIN) 300 MG capsule Take 300 mg by mouth at bedtime.       ibuprofen (ADVIL) 200 MG tablet Take 800 mg by mouth daily.       levothyroxine (SYNTHROID) 137 MCG tablet Take 137 mcg by mouth daily.       LORazepam (ATIVAN) 1 MG tablet Take 1 mg by mouth 3 (three) times daily.       MAGNESIUM PO Take 1 tablet by mouth daily.       OVER THE COUNTER MEDICATION Place 1 drop into both eyes 2 (two) times daily as needed (dry eyes). iVizia eye drops       pantoprazole (PROTONIX) 40 MG tablet 40 mg daily.       pramipexole (MIRAPEX) 0.25 MG tablet Take 0.25 mg by mouth at bedtime as needed (resless legs).       propranolol (INDERAL) 10 MG tablet Take 10 mg by mouth 2 (two) times daily.       Turmeric (QC TUMERIC COMPLEX PO) Take 1 tablet by mouth daily.       zolpidem (AMBIEN) 10 MG tablet Take 10 mg by mouth at bedtime as needed for sleep.          Drug Regimen Review Drug regimen was reviewed and remains appropriate with no significant issues identified   Home: Home Living Family/patient expects to be  discharged to:: Private residence Living Arrangements: Alone Available Help at Discharge: Family, Available 24 hours/day Type of Home: House Home Access: Stairs to enter [employer] of Steps: 3 Entrance Stairs-Rails: Left Home Layout: One level Bathroom Shower/Tub: Multimedia programmer: Standard Home Equipment: Cane - single point, Civil engineer, contracting Additional Comments: plan to D/C to daughters home, level entrance   Functional History: Prior Function Prior Level of Function : Independent/Modified Independent, History of Falls (last six months) Mobility Comments: pt ambulates with use of cane, reports 4 falls in last 6 months, most occuring outdoors   Functional Status:  Mobility:  Bed Mobility Overal bed mobility: Needs Assistance Bed Mobility: Supine to Sit Rolling: Min guard Sidelying to sit: Mod assist, HOB elevated Supine to sit: Min assist, HOB elevated General bed mobility comments: assist for truncal elevation, LE lowering over EOB, and scooting to EOB with assist of bed pad. Cues for gaze stabilization, increased time at each positional change. Transfers Overall transfer level: Needs assistance Equipment used: Rolling walker (2 wheels) Transfers: Sit to/from Stand Sit to Stand: Min assist, From elevated surface Bed to/from chair/wheelchair/BSC transfer type:: Step pivot Step pivot transfers: Min assist General transfer comment: Assist for rise, steady. Cues for safe hand placement when rising. Ambulation/Gait Ambulation/Gait assistance: Min assist Gait Distance (Feet): 20 Feet Assistive device: Rolling walker (2 wheels) Gait Pattern/deviations: Step-through pattern, Decreased stride length, Trunk flexed General Gait Details: assist to steady, guide pt and RW, cues for upright posture and directing RW. Gait velocity: decr Gait velocity interpretation: <1.31 ft/sec, indicative of household ambulator Pre-gait activities: march in place prior to  initiating gait, gaze stabilization during gait   ADL:   Cognition: Cognition Overall Cognitive Status: Impaired/Different from baseline Orientation Level: Oriented X4 Cognition Arousal/Alertness: Awake/alert Behavior During Therapy: WFL for tasks assessed/performed Overall Cognitive Status: Impaired/Different from baseline Area of Impairment: Following commands, Problem solving Following Commands: Follows one step commands with increased time Problem Solving: Slow processing, Difficulty sequencing, Requires verbal cues, Requires tactile cues   Physical Exam: Blood pressure 110/62, pulse (!) 52, temperature 97.8 F (36.6 C), temperature source Oral, resp. rate 17, weight 59.1 kg, SpO2 99 %. Physical Exam Vitals and nursing note reviewed.  Constitutional:      Comments: Pt sitting up in bed; kept grabbing L anterior lower chest due to pain with deep breath; appears younger than stated age, wearing O2 2L by , NAD  HENT:     Head: Normocephalic and atraumatic.     Comments: Smile equal; tongue midline    Right Ear: External ear normal.     Left Ear: External ear normal.     Nose: Nose normal. No congestion.     Mouth/Throat:     Mouth: Mucous membranes are dry.     Pharynx: Oropharynx is clear. No oropharyngeal exudate.  Eyes:     General:        Right eye: No discharge.        Left eye: No discharge.     Extraocular Movements: Extraocular movements intact.     Comments: Nystagmus mainly to right side  Cardiovascular:     Rate and Rhythm: Normal rate and regular rhythm.     Heart sounds: Normal heart sounds. No murmur heard.   No gallop.  Pulmonary:     Comments: Hard ot get deep breath, however CTA B/L- no W/R/R Abdominal:     Comments: Soft, NT; somewhat distended vs protuberant; hypoactive BS  Musculoskeletal:     Cervical back: Normal range of motion. No rigidity.     Comments: UE and LE strength 5-/5; tested Deltoid, biceps, triceps, WE< ,grip and finger abdction;  as well as HF, KE, KF DF and PF  Skin:    Comments: No bruising on anterior L chest wall; skin OK; dry.warm  Neurological:     Comments: Patient is alert pleasant and cooperative.  Follows simple commands.  Makes eye contact with examiner.  Provides name and age.  She was able to recall events of her fall. Nystagmus to right side only Intact to light touch in all 4 extremities B/L  Psychiatric:        Mood and Affect: Mood normal.        Behavior: Behavior normal.      Lab Results Last 48 Hours        Results for orders placed or performed during the hospital encounter of 12/11/20 (from the past 48 hour(s))  CBC     Status: Abnormal    Collection Time: 12/17/20  4:48 AM  Result Value Ref Range    WBC 7.2 4.0 - 10.5 K/uL    RBC 3.44 (L) 3.87 - 5.11 MIL/uL    Hemoglobin 10.7 (L) 12.0 - 15.0 g/dL    HCT 32.4 (L) 36.0 - 46.0 %    MCV 94.2 80.0 - 100.0 fL    MCH 31.1 26.0 - 34.0 pg    MCHC 33.0 30.0 - 36.0 g/dL    RDW 12.5 11.5 - 15.5 %    Platelets 236 150 - 400 K/uL    nRBC 0.0 0.0 - 0.2 %      Comment: Performed at [facility], [place] [address], [place], [place] [address]  Basic metabolic panel     Status: Abnormal    Collection Time: 12/17/20  4:48 AM  Result Value Ref Range    Sodium 135 135 - 145 mmol/L    Potassium 3.9 3.5 - 5.1 mmol/L    Chloride 99 98 - 111 mmol/L    CO2 28 22 - 32 mmol/L    Glucose, Bld 104 (H) 70 - 99 mg/dL      Comment: Glucose reference range applies only to samples taken after fasting for at least 8 hours.    BUN 22 8 - 23 mg/dL    Creatinine, Ser 0.77 0.44 - 1.00 mg/dL    Calcium 8.8 (L) 8.9 - 10.3 mg/dL    GFR, Estimated >60 >60 mL/min      Comment: (NOTE) Calculated using the CKD-EPI Creatinine Equation (2021)      Anion gap 8 5 - 15      Comment: Performed at [facility] [address], [place],  [address]      Imaging Results (Last 48 hours)  No results found.           Medical Problem List and Plan: 1.  Functional deficits with decreased in gait and dizziness secondary to traumatic SAH             -patient may  shower             -ELOS/Goals: 7-10 days supervision to min A 2.  Antithrombotics: -DVT/anticoagulation:  Mechanical: Antiembolism stockings, thigh (TED hose) Bilateral lower extremities             -antiplatelet therapy: N/A 3. Pain Management: Neurontin 300 mg nightly, Lidoderm patch as directed, tramadol as needed 4. Mood: Lexapro 20 mg daily             -antipsychotic agents: N/A 5. Neuropsych: This patient is capable of making decisions on her own behalf. 6. Skin/Wound Care: Routine skin checks 7. Fluids/Electrolytes/Nutrition: Routine in and outs with follow-up chemistries 8.  Hypothyroidism.  Synthroid 9.  Hypertension.  Inderal 10 mg twice daily 10.  Restless leg syndrome.  Continue Requip/Mirapex 0.25 mg nightly 11. Constipation- LBM 1 week ago- had miralax x2 days- if no BM, needs Sorbitol.    I have personally performed a face to face diagnostic evaluation of this patient and formulated the key components of the plan.  Additionally,  I have personally reviewed laboratory data, imaging studies, as well as relevant notes and concur with the physician assistant's documentation above.   The patient's status has not changed from the original H&P.  Any changes in documentation from the acute care chart have been noted above.       Lavon Paganini Angiulli, PA-C 12/18/2020

## 2020-12-18 NOTE — Progress Notes (Signed)
Inpatient Rehabilitation Admission Medication Review by a Pharmacist  A complete drug regimen review was completed for this patient to identify any potential clinically significant medication issues.  High Risk Drug Classes Is patient taking? Indication by Medication  Antipsychotic No   Anticoagulant No   Antibiotic No   Opioid No   Antiplatelet No   Hypoglycemics/insulin No   Vasoactive Medication Yes Propranolol for HTN  Chemotherapy No   Other No      Type of Medication Issue Identified Description of Issue Recommendation(s)  Drug Interaction(s) (clinically significant)     Duplicate Therapy     Allergy     No Medication Administration End Date     Incorrect Dose     Additional Drug Therapy Needed  Ca, Welchol, FESO4, Gabapentin 100mg  daily AM, Ativan TID, Naproxen BID (on discharge summary?)   Significant med changes from prior encounter (inform family/care partners about these prior to discharge). D/c amLODipine-olmesartan 5-40 MG tablet   Other       Clinically significant medication issues were identified that warrant physician communication and completion of prescribed/recommended actions by midnight of the next day:  No  Time spent performing this drug regimen review (minutes):  76min  Sirenity Shew S. Alford Highland, PharmD, BCPS Clinical Staff Pharmacist [email or web address] Eilene Ghazi Stillinger 12/18/2020 5:00 PM

## 2020-12-18 NOTE — Discharge Summary (Signed)
Physician Discharge Summary  Cheryl Stafford DXI:[id number] DOB: [birth year] DOA: 12/11/2020  PCP: Cheryl Dress, MD  Admit date: 12/11/2020 Discharge date: 12/18/2020  Time spent: 35 minutes  Recommendations for Outpatient Follow-up:  CIR for short-term rehab   Discharge Diagnoses:  Principal Problem:   Subarachnoid hemorrhage following injury with concussion Small subdural hematoma Cerebral contusion Fall Chest wall pain Posterior rib fractures Scalp laceration   GERD without esophagitis   Essential hypertension   Hypothyroidism   Cardiomegaly   Post concussive syndrome   Hypotension   Discharge Condition: Stable  Diet recommendation: Low-sodium  Filed Weights   12/13/20 0648  Weight: 59.1 kg    History of present illness:  [name] with history of type 2 diabetes mellitus, iron deficiency anemia, hypertension, hypothyroidism, GERD, hiatal hernia fell backwards while picking up a package, struck her head, had small laceration with bleeding from her scalp, was brought to [facility] ED. -CT head noted tiny right temporal SAH, EDP discussed with neurosurgery, no further work-up recommended. -Subsequently patient had dizziness, nausea and an episode of vomiting, TRH was consulted for admission, chest x-ray noted cardiomegaly    Hospital Course:   Subarachnoid hemorrhage following injury with concussion, small SDH Dizziness  -Following mechanical fall - case d/w Neurosurgery on admission, no workup recommended - Postconcussion symptoms and intermittent dizziness and headaches -MRI repeated on 12/9 for worsening dizziness -noted small SDH, trace residual SAH and contusion, nonhemorrhagic at the right temporal lobe  -Continue supportive care, Tylenol, meclizine -Plan for CIR for short-term rehab   Posterior rib fractures -Following mechanical fall, x-ray rib series negative -Slowly improving with supportive care -CT chest today for completeness noted posterior fracture of  ribs 8 9 and 10, with small adjoining pleural effusion -Continue supportive care, incentive spirometry, tramadol, naproxen X 2 days   Cardiomegaly -Noted on x-ray, no symptoms of CHF, pericardial effusion -2D echo noted preserved EF, grade 1 diastolic dysfunction , no significant valvular disease, no pericardial effusion   Hypertension -continue propranolol, discontinued amlodipine and ARB -Blood pressure is more stable now   Hypothyroidism -Continue Synthroid  Discharge Exam: Vitals:   12/18/20 0810 12/18/20 1208  BP: 132/66   Pulse: (!) 55 (!) 56  Resp: 14 20  Temp: 97.6 F (36.4 C) 97.8 F (36.6 C)  SpO2: 97% 99%   Pleasant elderly female sitting up in bed, appears comfortable today, AAOx3 CVS: S1-S2, regular rate rhythm Lungs: Decreased breath sounds at left base, lateral chest wall tenderness posteriorly and mid axillary line Abdomen: Soft, nontender, bowel sounds present Extremities: No edema  Skin: No rash on exposed skin Psych: Appropriate mood and affect   Discharge Instructions    Allergies as of 12/18/2020       Reactions   Codeine Nausea Only        Medication List     STOP taking these medications    amLODipine-olmesartan 5-40 MG tablet Commonly known as: AZOR   ibuprofen 200 MG tablet Commonly known as: ADVIL       TAKE these medications    acetaminophen 325 MG tablet Commonly known as: TYLENOL Take 2 tablets (650 mg total) by mouth every 6 (six) hours as needed for mild pain (or Fever >/= 101).   CALCIUM PO Take 1 tablet by mouth daily.   colesevelam 625 MG tablet Commonly known as: WELCHOL Take 1,875 mg by mouth 2 (two) times daily with a meal.   escitalopram 20 MG tablet Commonly known as: LEXAPRO Take 20 mg by mouth  daily.   ferrous sulfate 324 MG Tbec Take 324 mg by mouth daily.   gabapentin 100 MG capsule Commonly known as: NEURONTIN Take 100 mg by mouth daily.   gabapentin 300 MG capsule Commonly known as:  NEURONTIN Take 300 mg by mouth at bedtime.   levothyroxine 137 MCG tablet Commonly known as: SYNTHROID Take 137 mcg by mouth daily.   lidocaine 5 % Commonly known as: LIDODERM Place 1 patch onto the skin daily. Remove & Discard patch within 12 hours or as directed by MD Start taking on: December 19, 2020   LORazepam 1 MG tablet Commonly known as: ATIVAN Take 1 mg by mouth 3 (three) times daily.   MAGNESIUM PO Take 1 tablet by mouth daily.   meclizine 12.5 MG tablet Commonly known as: ANTIVERT Take 1 tablet (12.5 mg total) by mouth 2 (two) times daily.   naproxen 250 MG tablet Commonly known as: NAPROSYN Take 1 tablet (250 mg total) by mouth 2 (two) times daily with a meal for 2 days.   OVER THE COUNTER MEDICATION Place 1 drop into both eyes 2 (two) times daily as needed (dry eyes). iVizia eye drops   pantoprazole 40 MG tablet Commonly known as: PROTONIX 40 mg daily.   polyethylene glycol 17 g packet Commonly known as: MIRALAX / GLYCOLAX Take 17 g by mouth daily.   pramipexole 0.25 MG tablet Commonly known as: MIRAPEX Take 0.25 mg by mouth at bedtime as needed (resless legs).   propranolol 10 MG tablet Commonly known as: INDERAL Take 10 mg by mouth 2 (two) times daily.   QC TUMERIC COMPLEX PO Take 1 tablet by mouth daily.   senna-docusate 8.6-50 MG tablet Commonly known as: Senokot-S Take 1 tablet by mouth 2 (two) times daily.   traMADol 50 MG tablet Commonly known as: ULTRAM Take 1 tablet (50 mg total) by mouth every 6 (six) hours as needed for moderate pain.   zolpidem 10 MG tablet Commonly known as: AMBIEN Take 10 mg by mouth at bedtime as needed for sleep.               Durable Medical Equipment  (From admission, onward)           Start     Ordered   12/14/20 1145  For home use only DME Walker rolling  Once       Question Answer Comment  Walker: With 5 Inch Wheels   Patient needs a walker to treat with the following condition Weakness  generalized      12/14/20 1144           Allergies  Allergen Reactions   Codeine Nausea Only    Follow-up Information     [facility] Follow up.   Why: (Adapt)- Rolling walker arranged- to be delivered to room prior to discharge Contact information: [address] [place] [address] [phone]         [facility] Follow up.   Specialty: [place] Why: HHPT arranged- they will contact you to schedule home visits (anticipate initial visit first of next week) Contact information: [place] [address] [phone]         Cheryl Dress, MD. Schedule an appointment as soon as possible for a visit in 1 week(s).   Specialty: Internal Medicine Contact information: [place] [address] [phone]  The results of significant diagnostics from this hospitalization (including imaging, microbiology, ancillary and laboratory) are listed below for reference.    Significant Diagnostic Studies: DG Chest 1 View  Result Date: 12/11/2020 CLINICAL DATA:  Fall. EXAM: CHEST  1 VIEW COMPARISON:  Chest x-ray 08/14/2007.  CT PE protocol 04/27/2003. FINDINGS: The cardiac silhouette is moderately enlarged, a new finding. The lungs and costophrenic angles are clear. There is no pneumothorax. Peripherally calcified rounded density in the lower right neck measuring 2 cm is unchanged from prior examination corresponding to peripherally calcified right thyroid nodule. The osseous structures are within normal limits. IMPRESSION: 1. New moderate enlargement of the cardiac silhouette may represent cardiomegaly and/or pericardial effusion. 2. Lungs are clear. Electronically Signed   By: Ronney Asters M.D.   On: 12/11/2020 23:59   DG Ribs Unilateral Left  Result Date: 12/13/2020 CLINICAL DATA:  Trauma, fall EXAM: LEFT RIBS - 2 VIEW COMPARISON:  None. FINDINGS: No displaced fracture or  dislocation is seen. Osteopenia is seen in bony structures. A skin marker is noted in the lateral aspect of left lower ribs. There is dextroscoliosis at thoracolumbar junction. Degenerative changes are noted in the thoracolumbar junction. IMPRESSION: No displaced fracture is seen in the left ribs. Electronically Signed   By: Elmer Picker M.D.   On: 12/13/2020 16:41   DG Lumbar Spine Complete  Result Date: 12/11/2020 CLINICAL DATA:  Fall EXAM: LUMBAR SPINE - COMPLETE 4+ VIEW COMPARISON:  02/29/2016 FINDINGS: Dextroscoliosis. Vertebral body heights are maintained. Multilevel facet arthrosis. IMPRESSION: No acute fracture or static subluxation of the lumbar spine. Marked right convex scoliosis. Electronically Signed   By: Ulyses Jarred M.D.   On: 12/11/2020 23:59   CT HEAD WO CONTRAST (5MM)  Result Date: 12/14/2020 CLINICAL DATA:  Head trauma.  Follow-up intracranial hemorrhage EXAM: CT HEAD WITHOUT CONTRAST TECHNIQUE: Contiguous axial images were obtained from the base of the skull through the vertex without intravenous contrast. COMPARISON:  CT head 12/11/2020 FINDINGS: Brain: Resolution of small amount of subarachnoid hemorrhage in the right temporal region. Small subdural hematoma along the posterior falx and right tentorium. This was present previously however has migrated posteriorly in the interval. Question new hypodensity right temporal lobe. Reference axial image 12. This could be due to acute infarct or possibly artifact. Mild atrophy. Moderate chronic microvascular ischemic change throughout the white matter. Vascular: Negative for hyperdense vessel Skull: Negative Sinuses/Orbits: Air-fluid level sphenoid sinus. Remaining paranasal sinuses clear. Bilateral ocular surgery. Other: None IMPRESSION: 1. Interval resolution of small volume right temporal subarachnoid hemorrhage 2. Small subdural hematoma along the posterior falx and tentorium on the right. There was subdural hemorrhage along the  tentorium previously but this has migrated posteriorly. 3. Atrophy and chronic microvascular ischemia 4. Question new area of low attenuation in the right temporal lobe which could be due to acute infarct or artifact. Correlate with symptoms. MRI may be helpful for further evaluation. Electronically Signed   By: Franchot Gallo M.D.   On: 12/14/2020 12:27   CT Head Wo Contrast  Result Date: 12/11/2020 CLINICAL DATA:  Trauma. EXAM: CT HEAD WITHOUT CONTRAST CT CERVICAL SPINE WITHOUT CONTRAST TECHNIQUE: Multidetector CT imaging of the head and cervical spine was performed following the standard protocol without intravenous contrast. Multiplanar CT image reconstructions of the cervical spine were also generated. COMPARISON:  None. FINDINGS: CT HEAD FINDINGS Brain: There is minimal right temporal subarachnoid hemorrhage (12/3, 33/5). No mass effect or midline shift. There is mild age-related atrophy and chronic microvascular ischemic  changes. Forty Vascular: No hyperdense vessel or unexpected calcification. Skull: Normal. Negative for fracture or focal lesion. Sinuses/Orbits: No acute finding. Other: Left posterior parietal scalp laceration and hematoma. CT CERVICAL SPINE FINDINGS Alignment: No acute subluxation. There is straightening of normal cervical lordosis which may be positional or due to muscle spasm. Skull base and vertebrae: No acute fracture. Soft tissues and spinal canal: No prevertebral fluid or swelling. No visible canal hematoma. Disc levels:  Multilevel degenerative changes Upper chest: Biapical subpleural scarring. Other: A 2 cm rim calcified right thyroid nodule present since 2005. Stability for greater than 5 years implies benignity; no biopsy or followup indicated (ref: J Am Coll Radiol. 2015 Feb;12(2): 143-50). IMPRESSION: 1. Minimal right temporal subarachnoid hemorrhage. No mass effect or midline shift. 2. No acute/traumatic cervical spine pathology. These results were called by telephone at  the time of interpretation on 12/11/2020 at 9:57 pm to provider [facility] , who verbally acknowledged these results. Electronically Signed   By: Anner Crete M.D.   On: 12/11/2020 22:03   CT Cervical Spine Wo Contrast  Result Date: 12/11/2020 CLINICAL DATA:  Trauma. EXAM: CT HEAD WITHOUT CONTRAST CT CERVICAL SPINE WITHOUT CONTRAST TECHNIQUE: Multidetector CT imaging of the head and cervical spine was performed following the standard protocol without intravenous contrast. Multiplanar CT image reconstructions of the cervical spine were also generated. COMPARISON:  None. FINDINGS: CT HEAD FINDINGS Brain: There is minimal right temporal subarachnoid hemorrhage (12/3, 33/5). No mass effect or midline shift. There is mild age-related atrophy and chronic microvascular ischemic changes. Forty Vascular: No hyperdense vessel or unexpected calcification. Skull: Normal. Negative for fracture or focal lesion. Sinuses/Orbits: No acute finding. Other: Left posterior parietal scalp laceration and hematoma. CT CERVICAL SPINE FINDINGS Alignment: No acute subluxation. There is straightening of normal cervical lordosis which may be positional or due to muscle spasm. Skull base and vertebrae: No acute fracture. Soft tissues and spinal canal: No prevertebral fluid or swelling. No visible canal hematoma. Disc levels:  Multilevel degenerative changes Upper chest: Biapical subpleural scarring. Other: A 2 cm rim calcified right thyroid nodule present since 2005. Stability for greater than 5 years implies benignity; no biopsy or followup indicated (ref: J Am Coll Radiol. 2015 Feb;12(2): 143-50). IMPRESSION: 1. Minimal right temporal subarachnoid hemorrhage. No mass effect or midline shift. 2. No acute/traumatic cervical spine pathology. These results were called by telephone at the time of interpretation on 12/11/2020 at 9:57 pm to provider [facility] , who verbally acknowledged these results. Electronically Signed   By:  Anner Crete M.D.   On: 12/11/2020 22:03   MR BRAIN WO CONTRAST  Result Date: 12/15/2020 CLINICAL DATA:  Initial evaluation for recurrent dizziness. EXAM: MRI HEAD WITHOUT CONTRAST TECHNIQUE: Multiplanar, multiecho pulse sequences of the brain and surrounding structures were obtained without intravenous contrast. COMPARISON:  Prior CT from earlier same day as well as previous exams. FINDINGS: Brain: Cerebral volume within normal limits for age. Scattered patchy T2/FLAIR hyperintensity involving the periventricular and deep white matter both cerebral hemispheres, most consistent with chronic small vessel ischemic disease, moderate in nature. Again seen is a small subdural hematoma overlying the posterior right cerebral convexity (series 7, image 17). This measures up to 3 mm in maximal thickness. Extension along the right tentorium noted. No significant mass effect. Scattered FLAIR signal seen within several underlying cortical sulci, consistent with trace residual subarachnoid hemorrhage. Trace subarachnoid blood also noted at the posterior left temporal region. Trace intraventricular hemorrhage noted within the occipital horns of  both lateral ventricles, likely related to redistribution (series 8, image 10). 13 mm focus of parenchymal FLAIR signal at the peripheral right temporal lobe likely reflects an evolving nonhemorrhagic cortical contusion (series 7, image 9). No evidence for acute or subacute infarct. Gray-white matter differentiation otherwise maintained. No other areas of chronic cortical infarction. No other chronic blood products seen elsewhere within the brain. No mass lesion, midline shift, or mass effect. No hydrocephalus. Pituitary gland suprasellar region within normal limits. Midline structures intact. Vascular: Major intracranial vascular flow voids are maintained. Skull and upper cervical spine: Craniocervical junction within normal limits. Bone marrow signal intensity normal. Mild edema  at the posterior scalp near the vertex, likely evolving contusion. Sinuses/Orbits: Globes normal soft tissues within normal limits. Mild mucosal thickening noted within the left sphenoid sinus. Paranasal sinuses are otherwise largely clear. No significant mastoid effusion. Other: None. IMPRESSION: 1. Small 3 mm subdural hematoma overlying the posterior right cerebral convexity with extension along the right tentorium, stable. No significant mass effect. 2. Trace residual subarachnoid hemorrhage, most pronounced at the right occipital region. Trace intraventricular hemorrhage likely related to redistribution. 3. 13 mm evolving nonhemorrhagic cortical contusion at the peripheral right temporal lobe. 4. No other acute intracranial abnormality.  No infarct. 5. Underlying moderate chronic microvascular ischemic disease. Electronically Signed   By: Jeannine Boga M.D.   On: 12/15/2020 01:28   ECHOCARDIOGRAM COMPLETE  Result Date: 12/12/2020    ECHOCARDIOGRAM REPORT   Patient Name:   SUHEY RADFORD Date of Exam: 12/12/2020 Medical Rec #:  [id number]    Height:       63.0 in Accession #:    [id number]   Weight:       125.2 lb Date of Birth:  [birth year]   BSA:          1.585 m Patient Age:    87 years     BP:           117/63 mmHg Patient Gender: F            HR:           74 bpm. Exam Location:  Inpatient Procedure: 2D Echo, Cardiac Doppler and Color Doppler Indications:    Pericardial Effusion  History:        Patient has no prior history of Echocardiogram examinations.                 Risk Factors:Hypertension.  Sonographer:    Bernadene Person RDCS Referring Phys: [id number] [place]  1. Left ventricular ejection fraction, by estimation, is 60 to 65%. The left ventricle has normal function. The left ventricle has no regional wall motion abnormalities. Left ventricular diastolic parameters are consistent with Grade I diastolic dysfunction (impaired relaxation).  2. Right ventricular systolic  function is normal. The right ventricular size is normal. There is normal pulmonary artery systolic pressure.  3. The mitral valve is normal in structure. Mild mitral valve regurgitation. No evidence of mitral stenosis.  4. The aortic valve is tricuspid. Aortic valve regurgitation is mild. No aortic stenosis is present.  5. The inferior vena cava is normal in size with greater than 50% respiratory variability, suggesting right atrial pressure of 3 mmHg. Comparison(s): No prior Echocardiogram. FINDINGS  Left Ventricle: Left ventricular ejection fraction, by estimation, is 60 to 65%. The left ventricle has normal function. The left ventricle has no regional wall motion abnormalities. The left ventricular internal cavity size was normal in size. There is  no left  ventricular hypertrophy. Left ventricular diastolic parameters are consistent with Grade I diastolic dysfunction (impaired relaxation). Right Ventricle: The right ventricular size is normal. Right ventricular systolic function is normal. There is normal pulmonary artery systolic pressure. The tricuspid regurgitant velocity is 2.63 m/s, and with an assumed right atrial pressure of 3 mmHg,  the estimated right ventricular systolic pressure is 50.9 mmHg. Left Atrium: Left atrial size was normal in size. Right Atrium: Right atrial size was normal in size. Pericardium: There is no evidence of pericardial effusion. Mitral Valve: The mitral valve is normal in structure. Mild mitral valve regurgitation. No evidence of mitral valve stenosis. Tricuspid Valve: The tricuspid valve is normal in structure. Tricuspid valve regurgitation is mild . No evidence of tricuspid stenosis. Aortic Valve: The aortic valve is tricuspid. Aortic valve regurgitation is mild. Aortic regurgitation PHT measures 873 msec. No aortic stenosis is present. Pulmonic Valve: The pulmonic valve was normal in structure. Pulmonic valve regurgitation is trivial. No evidence of pulmonic stenosis. Aorta:  The aortic root is normal in size and structure. Venous: The inferior vena cava is normal in size with greater than 50% respiratory variability, suggesting right atrial pressure of 3 mmHg. IAS/Shunts: No atrial level shunt detected by color flow Doppler.  LEFT VENTRICLE PLAX 2D LVIDd:         4.10 cm     Diastology LVIDs:         2.50 cm     LV e' medial:    5.29 cm/s LV PW:         0.80 cm     LV E/e' medial:  13.0 LV IVS:        0.90 cm     LV e' lateral:   6.80 cm/s LVOT diam:     2.00 cm     LV E/e' lateral: 10.1 LV SV:         75 LV SV Index:   47 LVOT Area:     3.14 cm  LV Volumes (MOD) LV vol d, MOD A2C: 45.0 ml LV vol d, MOD A4C: 76.8 ml LV vol s, MOD A2C: 24.3 ml LV vol s, MOD A4C: 29.0 ml LV SV MOD A2C:     20.7 ml LV SV MOD A4C:     76.8 ml LV SV MOD BP:      35.8 ml RIGHT VENTRICLE RV S prime:     12.10 cm/s TAPSE (M-mode): 1.9 cm LEFT ATRIUM             Index        RIGHT ATRIUM           Index LA diam:        3.50 cm 2.21 cm/m   RA Area:     10.80 cm LA Vol (A2C):   22.8 ml 14.39 ml/m  RA Volume:   19.40 ml  12.24 ml/m LA Vol (A4C):   24.2 ml 15.27 ml/m LA Biplane Vol: 23.6 ml 14.89 ml/m  AORTIC VALVE LVOT Vmax:         109.00 cm/s LVOT Vmean:        70.300 cm/s LVOT VTI:          0.239 m AI PHT:            873 msec AR Vena Contracta: 0.30 cm  AORTA Ao Root diam: 3.30 cm Ao Asc diam:  3.60 cm MITRAL VALVE  TRICUSPID VALVE MV Area (PHT): 3.08 cm     TR Peak grad:   27.7 mmHg MV Decel Time: 246 msec     TR Vmax:        263.00 cm/s MV E velocity: 68.90 cm/s MV A velocity: 102.00 cm/s  SHUNTS MV E/A ratio:  0.68         Systemic VTI:  0.24 m                             Systemic Diam: 2.00 cm Kirk Ruths MD Electronically signed by Kirk Ruths MD Signature Date/Time: 12/12/2020/12:37:43 PM    Final     Microbiology: Recent Results (from the past 240 hour(s))  Resp Panel by RT-PCR (Flu A&B, Covid) Nasopharyngeal Swab     Status: None   Collection Time: 12/12/20  3:10 AM    Specimen: Nasopharyngeal Swab; Nasopharyngeal(NP) swabs in vial transport medium  Result Value Ref Range Status   SARS Coronavirus 2 by RT PCR NEGATIVE NEGATIVE Final    Comment: (NOTE) SARS-CoV-2 target nucleic acids are NOT DETECTED.  The SARS-CoV-2 RNA is generally detectable in upper respiratory specimens during the acute phase of infection. The lowest concentration of SARS-CoV-2 viral copies this assay can detect is 138 copies/mL. A negative result does not preclude SARS-Cov-2 infection and should not be used as the sole basis for treatment or other patient management decisions. A negative result may occur with  improper specimen collection/handling, submission of specimen other than nasopharyngeal swab, presence of viral mutation(s) within the areas targeted by this assay, and inadequate number of viral copies(<138 copies/mL). A negative result must be combined with clinical observations, patient history, and epidemiological information. The expected result is Negative.  Fact Sheet for Patients:  EntrepreneurPulse.com.au  Fact Sheet for Healthcare Providers:  IncredibleEmployment.be  This test is no t yet approved or cleared by the [place] FDA and  has been authorized for detection and/or diagnosis of SARS-CoV-2 by FDA under an Emergency Use Authorization (EUA). This EUA will remain  in effect (meaning this test can be used) for the duration of the COVID-19 declaration under Section 564(b)(1) of the Act, 21 U.S.C.section 360bbb-3(b)(1), unless the authorization is terminated  or revoked sooner.       Influenza A by PCR NEGATIVE NEGATIVE Final   Influenza B by PCR NEGATIVE NEGATIVE Final    Comment: (NOTE) The Xpert Xpress SARS-CoV-2/FLU/RSV plus assay is intended as an aid in the diagnosis of influenza from Nasopharyngeal swab specimens and should not be used as a sole basis for treatment. Nasal washings and aspirates are  unacceptable for Xpert Xpress SARS-CoV-2/FLU/RSV testing.  Fact Sheet for Patients: EntrepreneurPulse.com.au  Fact Sheet for Healthcare Providers: IncredibleEmployment.be  This test is not yet approved or cleared by the [place] FDA and has been authorized for detection and/or diagnosis of SARS-CoV-2 by FDA under an Emergency Use Authorization (EUA). This EUA will remain in effect (meaning this test can be used) for the duration of the COVID-19 declaration under Section 564(b)(1) of the Act, 21 U.S.C. section 360bbb-3(b)(1), unless the authorization is terminated or revoked.  Performed at [facility], [place] [address], [place], [place] [address]   MRSA Next Gen by PCR, Nasal     Status: None   Collection Time: 12/12/20  9:11 PM   Specimen: Nasal Mucosa; Nasal Swab  Result Value Ref Range Status   MRSA by PCR Next Gen NOT DETECTED NOT  DETECTED Final    Comment: (NOTE) The GeneXpert MRSA Assay (FDA approved for NASAL specimens only), is one component of a comprehensive MRSA colonization surveillance program. It is not intended to diagnose MRSA infection nor to guide or monitor treatment for MRSA infections. Test performance is not FDA approved in patients less than 5 years old. Performed at [facility], [place] [address], [place], [place] [address]      Labs: Basic Metabolic Panel: Recent Labs  Lab 12/12/20 0802 12/13/20 0339 12/15/20 0313 12/16/20 0246 12/17/20 0448  NA 133* 134* 134* 133* 135  K 4.0 4.1 4.1 3.8 3.9  CL 98 107 100 99 99  CO2 29 22 26 28 28   GLUCOSE 114* 107* 104* 102* 104*  BUN 15 16 10 12 22   CREATININE 0.84 0.75 0.82 0.82 0.77  CALCIUM 8.9 8.1* 8.7* 8.8* 8.8*  MG 2.4 2.0  --   --   --    Liver Function Tests: Recent Labs  Lab 12/12/20 0802 12/13/20 0339  AST 19 13*  ALT 14 9  ALKPHOS 39 34*  BILITOT 0.8 0.5  PROT 5.8* 4.9*  ALBUMIN 3.3* 2.5*   No results for input(s): LIPASE, AMYLASE  in the last 168 hours. No results for input(s): AMMONIA in the last 168 hours. CBC: Recent Labs  Lab 12/12/20 0802 12/13/20 0339 12/15/20 0313 12/16/20 0246 12/17/20 0448  WBC 9.2 8.4 8.4 7.5 7.2  NEUTROABS 6.7 5.3  --   --   --   HGB 10.8* 9.6* 10.2* 10.6* 10.7*  HCT 33.3* 30.0* 31.5* 33.0* 32.4*  MCV 95.1 97.1 94.6 94.6 94.2  PLT 218 180 201 227 236   Cardiac Enzymes: No results for input(s): CKTOTAL, CKMB, CKMBINDEX, TROPONINI in the last 168 hours. BNP: BNP (last 3 results) No results for input(s): BNP in the last 8760 hours.  ProBNP (last 3 results) No results for input(s): PROBNP in the last 8760 hours.  CBG: Recent Labs  Lab 12/12/20 1617 12/12/20 2235 12/13/20 0750 12/13/20 1125 12/13/20 1539  GLUCAP 131* 120* 135* 120* 105*       Signed:  Domenic Polite MD.  [facility] 12/18/2020, 2:05 PM

## 2020-12-18 NOTE — Progress Notes (Signed)
Inpatient Rehabilitation Admissions Coordinator   I have a  CIR bed available to admit today. Dr Broadus John states pending CT today before clearance for Cir admit. I met with patient at bedside and she is aware and in agreement I have alerted acute team and TOC. Await CT scan results.  Danne Baxter, RN, MSN Rehab Admissions Coordinator [phone] 12/18/2020 11:14 AM

## 2020-12-18 NOTE — Telephone Encounter (Signed)
Pt EGD was rescheduled for 02/14/2021 @ 10:00 with Dr. Lyndel Safe.  Pt daughter [name] was made aware. Prep instructions were created for pt and sent via the Mail.  [name] Verbalized understanding with all questions answered

## 2020-12-18 NOTE — H&P (Signed)
Physical Medicine and Rehabilitation Admission H&P    Chief Complaint  Patient presents with   Fall  :with R temporal [place]   HPI: Cheryl Stafford is a 87 year old right-handed female with history of hypertension, hypothyroidism, iron deficiency anemia, type 2 diabetes mellitus, GERD.  Per chart review patient lives alone.  1 level home 3 steps to entry.  Ambulates with a cane with reports of 4 falls in the last 6 months.  She plans to be discharged to home with her daughter.  Presented 12/11/2020 after a fall while attempting to pick up a package when she fell backwards striking her head.  There was associated headache with nausea but no vomiting.  She denied loss of consciousness.  Cranial CT scan showed minimal right temporal subarachnoid hemorrhage.  No mass-effect or midline shift.  No acute traumatic cervical spine pathology.  Admission chemistries unremarkable sodium 133 glucose 150, WBC 13,100, troponin negative, urinalysis negative nitrite, hemoglobin A1c 5.6.  Neurosurgery Dr. Venetia Constable consulted advised conservative care with follow-up MRI 12/14/2020 showing small 3 mm subdural hematoma overlying the posterior right cerebral convexity with extension along the right tentorium no significant mass-effect.  Trace residual subarachnoid hemorrhage most pronounced at the right occipital region.  13 mm evolving nonhemorrhagic cortical contusion at the peripheral right temporal lobe.  No other acute intracranial abnormality.  Echocardiogram with ejection fraction of 60 to 65% no wall motion abnormalities grade 1 diastolic dysfunction.  Patient did have some complaints of left lateral chest wall tenderness felt to be musculoskeletal in nature x-ray rib series negative CTA chest did show a posterior rib fracture advised conservative care.  Follow-up speech therapy for any signs of dysphagia patient with longstanding history of multiple esophageal issues scheduled to have outpatient esophageal examination.   Found to have no obvious oral issues or overt aspiration maintain on a regular consistency diet.  Therapy evaluations completed due to patient decreased functional ability altered mental status was admitted for a comprehensive rehab program.  Pt reports pinpoint L anterior rib pain/Lateral wall tenderness.  Dizzy when turns head to Right- not to left.  LBM 1 week ago- had miralax yesterday and today.  Voiding OK.  O2- new; used cane at home.    Review of Systems  Constitutional:  Negative for chills and fever.  HENT:  Negative for hearing loss.   Eyes:  Negative for blurred vision and double vision.  Respiratory:  Negative for cough and shortness of breath.   Cardiovascular:  Positive for leg swelling. Negative for chest pain and palpitations.  Gastrointestinal:  Positive for constipation and nausea. Negative for heartburn and vomiting.       GERD  Genitourinary:  Negative for dysuria, flank pain and hematuria.  Musculoskeletal:  Positive for falls, joint pain and myalgias.  Skin:  Negative for rash.  Neurological:  Positive for dizziness.  Psychiatric/Behavioral:  Positive for depression. The patient has insomnia.   All other systems reviewed and are negative. Past Medical History:  Diagnosis Date   Esophageal dysphagia    Functional diarrhea    GERD (gastroesophageal reflux disease)    Hypertension    Hypothyroid    IDA (iron deficiency anemia)    Prediabetes 12/12/2020   Past Surgical History:  Procedure Laterality Date   ABDOMINAL HYSTERECTOMY     COLONOSCOPY  06/07/2012   Colonic polyp status post polypectomy, Pancolonic diverticulosis redominantly in the sigmoid colon. Examination was limited due to qualitiy of preparation.   ESOPHAGOGASTRODUODENOSCOPY  07/30/2016   Highly  torturous esophagus suggestive of motility disorder/presbyesophagus. Schatzki's ring status post esophageal dilatation. Large hiatal hernia. Mild gastritis.    TOTAL KNEE ARTHROPLASTY  01/02/2015    Family History  Problem Relation Age of Onset   Colon cancer Neg Hx    Stomach cancer Neg Hx    Social History:  reports that she has never smoked. She has never used smokeless tobacco. She reports that she does not drink alcohol and does not use drugs. Allergies:  Allergies  Allergen Reactions   Codeine Nausea Only   Medications Prior to Admission  Medication Sig Dispense Refill   amLODipine-olmesartan (AZOR) 5-40 MG tablet Take 1 tablet by mouth daily.     CALCIUM PO Take 1 tablet by mouth daily.     colesevelam (WELCHOL) 625 MG tablet Take 1,875 mg by mouth 2 (two) times daily with a meal.     escitalopram (LEXAPRO) 20 MG tablet Take 20 mg by mouth daily.     ferrous sulfate 324 MG TBEC Take 324 mg by mouth daily.     gabapentin (NEURONTIN) 100 MG capsule Take 100 mg by mouth daily.     gabapentin (NEURONTIN) 300 MG capsule Take 300 mg by mouth at bedtime.     ibuprofen (ADVIL) 200 MG tablet Take 800 mg by mouth daily.     levothyroxine (SYNTHROID) 137 MCG tablet Take 137 mcg by mouth daily.     LORazepam (ATIVAN) 1 MG tablet Take 1 mg by mouth 3 (three) times daily.     MAGNESIUM PO Take 1 tablet by mouth daily.     OVER THE COUNTER MEDICATION Place 1 drop into both eyes 2 (two) times daily as needed (dry eyes). iVizia eye drops     pantoprazole (PROTONIX) 40 MG tablet 40 mg daily.     pramipexole (MIRAPEX) 0.25 MG tablet Take 0.25 mg by mouth at bedtime as needed (resless legs).     propranolol (INDERAL) 10 MG tablet Take 10 mg by mouth 2 (two) times daily.     Turmeric (QC TUMERIC COMPLEX PO) Take 1 tablet by mouth daily.     zolpidem (AMBIEN) 10 MG tablet Take 10 mg by mouth at bedtime as needed for sleep.      Drug Regimen Review Drug regimen was reviewed and remains appropriate with no significant issues identified  Home: Home Living Family/patient expects to be discharged to:: Private residence Living Arrangements: Alone Available Help at Discharge: Family,  Available 24 hours/day Type of Home: House Home Access: Stairs to enter Technical brewer of Steps: 3 Entrance Stairs-Rails: Left Home Layout: One level Bathroom Shower/Tub: Multimedia programmer: Standard Home Equipment: Radio producer - single point, Civil engineer, contracting Additional Comments: plan to D/C to daughters home, level entrance   Functional History: Prior Function Prior Level of Function : Independent/Modified Independent, History of Falls (last six months) Mobility Comments: pt ambulates with use of cane, reports 4 falls in last 6 months, most occuring outdoors  Functional Status:  Mobility: Bed Mobility Overal bed mobility: Needs Assistance Bed Mobility: Supine to Sit Rolling: Min guard Sidelying to sit: Mod assist, HOB elevated Supine to sit: Min assist, HOB elevated General bed mobility comments: assist for truncal elevation, LE lowering over EOB, and scooting to EOB with assist of bed pad. Cues for gaze stabilization, increased time at each positional change. Transfers Overall transfer level: Needs assistance Equipment used: Rolling walker (2 wheels) Transfers: Sit to/from Stand Sit to Stand: Min assist, From elevated surface Bed to/from chair/wheelchair/BSC transfer type::  Step pivot Step pivot transfers: Min assist General transfer comment: Assist for rise, steady. Cues for safe hand placement when rising. Ambulation/Gait Ambulation/Gait assistance: Min assist Gait Distance (Feet): 20 Feet Assistive device: Rolling walker (2 wheels) Gait Pattern/deviations: Step-through pattern, Decreased stride length, Trunk flexed General Gait Details: assist to steady, guide pt and RW, cues for upright posture and directing RW. Gait velocity: decr Gait velocity interpretation: <1.31 ft/sec, indicative of household ambulator Pre-gait activities: march in place prior to initiating gait, gaze stabilization during gait    ADL:    Cognition: Cognition Overall Cognitive  Status: Impaired/Different from baseline Orientation Level: Oriented X4 Cognition Arousal/Alertness: Awake/alert Behavior During Therapy: WFL for tasks assessed/performed Overall Cognitive Status: Impaired/Different from baseline Area of Impairment: Following commands, Problem solving Following Commands: Follows one step commands with increased time Problem Solving: Slow processing, Difficulty sequencing, Requires verbal cues, Requires tactile cues  Physical Exam: Blood pressure 110/62, pulse (!) 52, temperature 97.8 F (36.6 C), temperature source Oral, resp. rate 17, weight 59.1 kg, SpO2 99 %. Physical Exam Vitals and nursing note reviewed.  Constitutional:      Comments: Pt sitting up in bed; kept grabbing L anterior lower chest due to pain with deep breath; appears younger than stated age, wearing O2 2L by [place], NAD  HENT:     Head: Normocephalic and atraumatic.     Comments: Smile equal; tongue midline    Right Ear: External ear normal.     Left Ear: External ear normal.     Nose: Nose normal. No congestion.     Mouth/Throat:     Mouth: Mucous membranes are dry.     Pharynx: Oropharynx is clear. No oropharyngeal exudate.  Eyes:     General:        Right eye: No discharge.        Left eye: No discharge.     Extraocular Movements: Extraocular movements intact.     Comments: Nystagmus mainly to right side  Cardiovascular:     Rate and Rhythm: Normal rate and regular rhythm.     Heart sounds: Normal heart sounds. No murmur heard.   No gallop.  Pulmonary:     Comments: Hard ot get deep breath, however CTA B/L- no W/R/R Abdominal:     Comments: Soft, NT; somewhat distended vs protuberant; hypoactive BS  Musculoskeletal:     Cervical back: Normal range of motion. No rigidity.     Comments: UE and LE strength 5-/5; tested Deltoid, biceps, triceps, WE< ,grip and finger abdction; as well as HF, KE, KF DF and PF  Skin:    Comments: No bruising on anterior L chest wall; skin OK;  dry.warm  Neurological:     Comments: Patient is alert pleasant and cooperative.  Follows simple commands.  Makes eye contact with examiner.  Provides name and age.  She was able to recall events of her fall. Nystagmus to right side only Intact to light touch in all 4 extremities B/L   Psychiatric:        Mood and Affect: Mood normal.        Behavior: Behavior normal.    Results for orders placed or performed during the hospital encounter of 12/11/20 (from the past 48 hour(s))  CBC     Status: Abnormal   Collection Time: 12/17/20  4:48 AM  Result Value Ref Range   WBC 7.2 4.0 - 10.5 K/uL   RBC 3.44 (L) 3.87 - 5.11 MIL/uL   Hemoglobin 10.7 (L) 12.0 -  15.0 g/dL   HCT 32.4 (L) 36.0 - 46.0 %   MCV 94.2 80.0 - 100.0 fL   MCH 31.1 26.0 - 34.0 pg   MCHC 33.0 30.0 - 36.0 g/dL   RDW 12.5 11.5 - 15.5 %   Platelets 236 150 - 400 K/uL   nRBC 0.0 0.0 - 0.2 %    Comment: Performed at [facility], [place] [address] [place], [place] [address]  Basic metabolic panel     Status: Abnormal   Collection Time: 12/17/20  4:48 AM  Result Value Ref Range   Sodium 135 135 - 145 mmol/L   Potassium 3.9 3.5 - 5.1 mmol/L   Chloride 99 98 - 111 mmol/L   CO2 28 22 - 32 mmol/L   Glucose, Bld 104 (H) 70 - 99 mg/dL    Comment: Glucose reference range applies only to samples taken after fasting for at least 8 hours.   BUN 22 8 - 23 mg/dL   Creatinine, Ser 0.77 0.44 - 1.00 mg/dL   Calcium 8.8 (L) 8.9 - 10.3 mg/dL   GFR, Estimated >60 >60 mL/min    Comment: (NOTE) Calculated using the CKD-EPI Creatinine Equation (2021)    Anion gap 8 5 - 15    Comment: Performed at [facility] [address], [place], [place] [address]   No results found.     Medical Problem List and Plan: 1. Functional deficits with decreased in gait and dizziness secondary to traumatic SAH  -patient may  shower  -ELOS/Goals: 7-10 days supervision to min A 2.  Antithrombotics: -DVT/anticoagulation:  Mechanical:  Antiembolism stockings, thigh (TED hose) Bilateral lower extremities  -antiplatelet therapy: N/A 3. Pain Management: Neurontin 300 mg nightly, Lidoderm patch as directed, tramadol as needed 4. Mood: Lexapro 20 mg daily  -antipsychotic agents: N/A 5. Neuropsych: This patient is capable of making decisions on her own behalf. 6. Skin/Wound Care: Routine skin checks 7. Fluids/Electrolytes/Nutrition: Routine in and outs with follow-up chemistries 8.  Hypothyroidism.  Synthroid 9.  Hypertension.  Inderal 10 mg twice daily 10.  Restless leg syndrome.  Continue Requip/Mirapex 0.25 mg nightly 11. Constipation- LBM 1 week ago- had miralax x2 days- if no BM, needs Sorbitol.   I have personally performed a face to face diagnostic evaluation of this patient and formulated the key components of the plan.  Additionally, I have personally reviewed laboratory data, imaging studies, as well as relevant notes and concur with the physician assistant's documentation above.   The patient's status has not changed from the original H&P.  Any changes in documentation from the acute care chart have been noted above.     Lavon Paganini Angiulli, PA-C 12/18/2020

## 2020-12-18 NOTE — PMR Pre-admission (Signed)
PMR Admission Coordinator Pre-Admission Assessment  Patient: Cheryl Stafford is an 87 y.o., female MRN: [id number] DOB: 10-Oct-1929 Height:   Weight: 59.1 kg  Insurance Information HMO:     PPO:      PCP:      IPA:      80/20:      OTHER:  PRIMARY: Medicare a and b      Policy#: [id number]      Subscriber: pt Benefits:  Phone #: passport one source     Name: 12/13 Eff. Date: a 12/06/1996 and b 6/1/199     Deduct: $1556      Out of Pocket Max: none      Life Max: none CIR: 100%      SNF: 20 full days Outpatient: 80%     Co-Pay: 20% Home Health: 100%      Co-Pay: none DME: 80%     Co-Pay: 20% Providers: pt choice  SECONDARY: Tricare for Life      Policy#: [id number]  Financial Counselor:       Phone#:   The Data Collection Information Summary for patients in Inpatient Rehabilitation Facilities with attached Privacy Act [place] Records was provided and verbally reviewed with: Patient and Family  Emergency Contact Information Contact Information     Name Relation Home Work Concow Daughter   [phone]      Current Medical History  Patient Admitting Diagnosis: Fall, SAH  History of Present Illness:  87 year old right-handed female with history of hypertension, hypothyroidism, iron deficiency anemia, type 2 diabetes mellitus, GERD.   Presented 12/11/2020 after a fall while attempting to pick up a package when she fell backwards striking her head.  There was associated headache with nausea but no vomiting.  She denied loss of consciousness.  Cranial CT scan showed minimal right temporal subarachnoid hemorrhage.  No mass-effect or midline shift.  No acute traumatic cervical spine pathology.  Admission chemistries unremarkable sodium 133 glucose 150, WBC 13,100, troponin negative, urinalysis negative nitrite, hemoglobin A1c 5.6.  Neurosurgery Dr. Venetia Constable consulted advised conservative care with follow-up MRI 12/14/2020 showing small 3 mm subdural hematoma  overlying the posterior right cerebral convexity with extension along the right tentorium no significant mass-effect.  Trace residual subarachnoid hemorrhage most pronounced at the right occipital region.  13 mm evolving nonhemorrhagic cortical contusion at the peripheral right temporal lobe.  No other acute intracranial abnormality.  Echocardiogram with ejection fraction of 60 to 65% no wall motion abnormalities grade 1 diastolic dysfunction.  Patient did have some complaints of left lateral chest wall tenderness felt to be musculoskeletal in nature x-ray rib series negative CTA chest was pending.  Follow-up speech therapy for any signs of dysphagia patient with longstanding history of multiple esophageal issues scheduled to have outpatient esophageal examination.  Found to have no obvious oral issues or overt aspiration maintain on a regular consistency diet.  CT chest 12/13 with posterior rib fxs per report.   Patient's medical record from [facility] has been reviewed by the rehabilitation admission coordinator and physician.  Past Medical History  Past Medical History:  Diagnosis Date   Esophageal dysphagia    Functional diarrhea    GERD (gastroesophageal reflux disease)    Hypertension    Hypothyroid    IDA (iron deficiency anemia)    Prediabetes 12/12/2020   Has the patient had major surgery during 100 days prior to admission? No  Family History   family history is not on file.  Current  Medications  Current Facility-Administered Medications:    acetaminophen (TYLENOL) tablet 650 mg, 650 mg, Oral, Q6H PRN, 650 mg at 12/17/20 1647 **OR** acetaminophen (TYLENOL) suppository 650 mg, 650 mg, Rectal, Q6H PRN, Shalhoub, Sherryll Burger, MD   escitalopram (LEXAPRO) tablet 20 mg, 20 mg, Oral, Daily, Shalhoub, Sherryll Burger, MD, 20 mg at 12/18/20 0920   gabapentin (NEURONTIN) capsule 300 mg, 300 mg, Oral, QHS, Kathryne Eriksson, NP, 300 mg at 12/17/20 2118   labetalol (NORMODYNE) injection 10 mg, 10  mg, Intravenous, Q2H PRN, Shalhoub, Sherryll Burger, MD   levothyroxine (SYNTHROID) tablet 137 mcg, 137 mcg, Oral, QAC breakfast, Shalhoub, Sherryll Burger, MD, 137 mcg at 12/18/20 0603   lidocaine (LIDODERM) 5 % 1 patch, 1 patch, Transdermal, Daily, Domenic Polite, MD, 1 patch at 12/18/20 0100   meclizine (ANTIVERT) tablet 12.5 mg, 12.5 mg, Oral, BID, Domenic Polite, MD, 12.5 mg at 12/18/20 0920   naproxen (NAPROSYN) tablet 250 mg, 250 mg, Oral, BID WC, Domenic Polite, MD, 250 mg at 12/18/20 1037   ondansetron (ZOFRAN) injection 4 mg, 4 mg, Intravenous, Once, Upstill, Shari, PA-C   ondansetron (ZOFRAN) tablet 4 mg, 4 mg, Oral, Q6H PRN, 4 mg at 12/18/20 0848 **OR** ondansetron (ZOFRAN) injection 4 mg, 4 mg, Intravenous, Q6H PRN, Shalhoub, Sherryll Burger, MD, 4 mg at 12/15/20 1742   pantoprazole (PROTONIX) EC tablet 40 mg, 40 mg, Oral, Daily, Shalhoub, Sherryll Burger, MD, 40 mg at 12/18/20 0920   polyethylene glycol (MIRALAX / GLYCOLAX) packet 17 g, 17 g, Oral, BID, Domenic Polite, MD   pramipexole (MIRAPEX) tablet 0.25 mg, 0.25 mg, Oral, QHS, Shalhoub, Sherryll Burger, MD, 0.25 mg at 12/17/20 2118   propranolol (INDERAL) tablet 10 mg, 10 mg, Oral, BID, Shalhoub, Sherryll Burger, MD, 10 mg at 12/17/20 2119   senna-docusate (Senokot-S) tablet 1 tablet, 1 tablet, Oral, BID, Domenic Polite, MD, 1 tablet at 12/18/20 0920   traMADol (ULTRAM) tablet 50 mg, 50 mg, Oral, Q6H PRN, Domenic Polite, MD, 50 mg at 12/18/20 7121  Patients Current Diet:  Diet Order             Diet Carb Modified Fluid consistency: Thin; Room service appropriate? Yes  Diet effective now                  Precautions / Restrictions Precautions Precautions: Fall Precaution Comments: history of falls, dizziness Restrictions Weight Bearing Restrictions: No RUE Weight Bearing: Weight bearing as tolerated LUE Weight Bearing: Weight bearing as tolerated RLE Weight Bearing: Weight bearing as tolerated LLE Weight Bearing: Weight bearing as tolerated   Has  the patient had 2 or more falls or a fall with injury in the past year? Yes  Prior Activity Level Community (5-7x/wk): Mod I with cane; drives  Prior Functional Level Self Care: Did the patient need help bathing, dressing, using the toilet or eating? Independent  Indoor Mobility: Did the patient need assistance with walking from room to room (with or without device)? Independent  Stairs: Did the patient need assistance with internal or external stairs (with or without device)? Independent  Functional Cognition: Did the patient need help planning regular tasks such as shopping or remembering to take medications? Independent  Patient Information Are you of Hispanic, Latino/a,or Spanish origin?: A. No, not of Hispanic, Latino/a, or Spanish origin What is your race?: A. White Do you need or want an interpreter to communicate with a doctor or health care staff?: 0. No  Patient's Response To:  Health Literacy and Transportation Is the patient  able to respond to health literacy and transportation needs?: Yes Health Literacy - How often do you need to have someone help you when you read instructions, pamphlets, or other written material from your doctor or pharmacy?: Never In the past 12 months, has lack of transportation kept you from medical appointments or from getting medications?: No In the past 12 months, has lack of transportation kept you from meetings, work, or from getting things needed for daily living?: No  Home Assistive Devices / [place]chid Devices/Equipment: Radio producer (specify quad or straight) Home Equipment: Cane - single point, Shower seat  Prior Device Use: Indicate devices/aids used by the patient prior to current illness, exacerbation or injury?  cane  Current Functional Level Cognition  Overall Cognitive Status: Impaired/Different from baseline Orientation Level: Oriented X4 Following Commands: Follows one step commands with increased time    Extremity  Assessment (includes Sensation/Coordination)  Upper Extremity Assessment: LUE deficits/detail LUE Deficits / Details: generalized weakness, likely resulting from pain in L rib cage  Lower Extremity Assessment: Generalized weakness    ADLs       Mobility  Overal bed mobility: Needs Assistance Bed Mobility: Supine to Sit Rolling: Min guard Sidelying to sit: Mod assist, HOB elevated Supine to sit: Min assist, HOB elevated General bed mobility comments: assist for truncal elevation, LE lowering over EOB, and scooting to EOB with assist of bed pad. Cues for gaze stabilization, increased time at each positional change.    Transfers  Overall transfer level: Needs assistance Equipment used: Rolling walker (2 wheels) Transfers: Sit to/from Stand Sit to Stand: Min assist, From elevated surface Bed to/from chair/wheelchair/BSC transfer type:: Step pivot Step pivot transfers: Min assist General transfer comment: Assist for rise, steady. Cues for safe hand placement when rising.    Ambulation / Gait / Stairs / Wheelchair Mobility  Ambulation/Gait Ambulation/Gait assistance: Herbalist (Feet): 20 Feet Assistive device: Rolling walker (2 wheels) Gait Pattern/deviations: Step-through pattern, Decreased stride length, Trunk flexed General Gait Details: assist to steady, guide pt and RW, cues for upright posture and directing RW. Gait velocity: decr Gait velocity interpretation: <1.31 ft/sec, indicative of household ambulator Pre-gait activities: march in place prior to initiating gait, gaze stabilization during gait    Posture / Balance Dynamic Sitting Balance Sitting balance - Comments: initially poor, transitioning to fair with UE support Balance Overall balance assessment: Needs assistance Sitting-balance support: Feet supported, Single extremity supported Sitting balance-Leahy Scale: Fair Sitting balance - Comments: initially poor, transitioning to fair with UE  support Standing balance support: During functional activity Standing balance-Leahy Scale: Poor Standing balance comment: minG with BUE support of RW, external support for walking    Special needs/care consideration Fall precautions   Previous Home Environment  Living Arrangements: Alone  Lives With: Alone Available Help at Discharge:  (to go stay with son and dtr in law at discharge) Type of Home: House Home Layout: One level Home Access: Stairs to enter Entrance Stairs-Rails: Left Entrance Stairs-Number of Steps: 3 Bathroom Shower/Tub: Multimedia programmer: Standard Bathroom Accessibility: Yes How Accessible: Accessible via walker Surprise: No Additional Comments: plan to d/c to son's home  Discharge Living Setting Plans for Discharge Living Setting: Other (Comment) (to stay with son at d/c) Type of Home at Discharge: House Discharge Home Layout: Two level, Able to live on main level with bedroom/bathroom Discharge Home Access: Stairs to enter Discharge Bathroom Shower/Tub: Tub/shower unit Discharge Bathroom Toilet: Standard Discharge Bathroom Accessibility: Yes How Accessible: Accessible  via walker Does the patient have any problems obtaining your medications?: No  Social/Family/Support Systems Patient Roles: Parent Contact Information: daughter, [name] and son, Ronalee Belts Anticipated Caregiver: son and daughter in law Anticipated Caregiver's Contact Information: see contacts Caregiver Availability: 24/7 Discharge Plan Discussed with Primary Caregiver: Yes Is Caregiver In Agreement with Plan?: Yes Does Caregiver/Family have Issues with Lodging/Transportation while Pt is in Rehab?: No  To live with son and his wife at discharge and [place] arranged by acute His Address is [place] [address] phone [phone]  Goals Patient/Family Goal for Rehab: supervision PT, supervision to min OT Expected length of stay: ELOS 7 to 10 days Pt/Family  Agrees to Admission and willing to participate: Yes Program Orientation Provided & Reviewed with Pt/Caregiver Including Roles  & Responsibilities: Yes  Decrease burden of Care through IP rehab admission: n/a  Possible need for SNF placement upon discharge: not anticipated  Patient Condition: I have reviewed medical records from [facility] , spoken with CM, and patient and daughter. I met with patient at the bedside for inpatient rehabilitation assessment.  Patient will benefit from ongoing PT and OT, can actively participate in 3 hours of therapy a day 5 days of the week, and can make measurable gains during the admission.  Patient will also benefit from the coordinated team approach during an Inpatient Acute Rehabilitation admission.  The patient will receive intensive therapy as well as Rehabilitation physician, nursing, social worker, and care management interventions.  Due to bladder management, bowel management, safety, skin/wound care, disease management, medication administration, pain management, and patient education the patient requires 24 hour a day rehabilitation nursing.  The patient is currently min assist overall with mobility and basic ADLs.  Discharge setting and therapy post discharge at home with home health is anticipated.  Patient has agreed to participate in the Acute Inpatient Rehabilitation Program and will admit today.  Preadmission Screen Completed By:  Cleatrice Burke, 12/18/2020 1:10 PM ______________________________________________________________________   Discussed status with Dr. Dagoberto Ligas on 12/18/2020 at 1426 and received approval for admission today.  Admission Coordinator:  Cleatrice Burke, RN, time  4695 Date 12/18/2020   Assessment/Plan: Diagnosis: Does the need for close, 24 hr/day Medical supervision in concert with the patient's rehab needs make it unreasonable for this patient to be served in a less intensive setting?  Yes Co-Morbidities requiring supervision/potential complications: posterior rib fractures, fall; R temporal SAH; post concussive symptoms; vertigo;  Due to bladder management, bowel management, safety, skin/wound care, disease management, medication administration, pain management, and patient education, does the patient require 24 hr/day rehab nursing? Yes Does the patient require coordinated care of a physician, rehab nurse, PT, OT, and SLP to address physical and functional deficits in the context of the above medical diagnosis(es)? Yes Addressing deficits in the following areas: balance, endurance, locomotion, strength, transferring, bowel/bladder control, bathing, dressing, feeding, grooming, and toileting Can the patient actively participate in an intensive therapy program of at least 3 hrs of therapy 5 days a week? Yes The potential for patient to make measurable gains while on inpatient rehab is good Anticipated functional outcomes upon discharge from inpatient rehab: supervision PT, supervision and min assist OT, n/a SLP Estimated rehab length of stay to reach the above functional goals is: 7-10 days  Anticipated discharge destination: Home 10. Overall Rehab/Functional Prognosis: good   MD Signature:

## 2020-12-18 NOTE — Progress Notes (Signed)
Courtney Heys, MD  Physician Physical Medicine and Rehabilitation PMR Pre-admission     Signed Date of Service:  12/18/2020 11:23 AM  Related encounter: ED to Hosp-Admission (Current) from 12/11/2020 in [place]      Show:Clear all '[x]' Written'[x]' Templated'[x]' Copied  Added by: '[x]' Cristina Gong, RN'[x]' Courtney Heys, MD  '[]' Hover for details                                                                                                                                                                                                                                                                                                                                                                                                                         PMR Admission Coordinator Pre-Admission Assessment   Patient: Cheryl Stafford is an 87 y.o., female MRN: [id number] DOB: [birth year] Height:   Weight: 59.1 kg   Insurance Information HMO:     PPO:      PCP:      IPA:      80/20:      OTHER:  PRIMARY: Medicare a and b      Policy#: [id number]      Subscriber: pt Benefits:  Phone #: passport one source     Name: 12/13 Eff. Date: a 12/06/1996 and b 6/1/199     Deduct: $1556      Out of Pocket Max: none      Life Max: none CIR: 100%  SNF: 20 full days Outpatient: 80%     Co-Pay: 20% Home Health: 100%      Co-Pay: none DME: 80%     Co-Pay: 20% Providers: pt choice  SECONDARY: Tricare for Life      Policy#: [id number]   Financial Counselor:       Phone#:    The Data Collection Information Summary for patients in Inpatient Rehabilitation Facilities with attached Privacy Act [place] Records was provided and verbally reviewed with: Patient and Family   Emergency Contact Information Contact Information       Name  Relation Home Work [place] Daughter     [phone]         Current Medical History  Patient Admitting Diagnosis: Fall, SAH   History of Present Illness:  87 year old right-handed female with history of hypertension, hypothyroidism, iron deficiency anemia, type 2 diabetes mellitus, GERD.   Presented 12/11/2020 after a fall while attempting to pick up a package when she fell backwards striking her head.  There was associated headache with nausea but no vomiting.  She denied loss of consciousness.  Cranial CT scan showed minimal right temporal subarachnoid hemorrhage.  No mass-effect or midline shift.  No acute traumatic cervical spine pathology.  Admission chemistries unremarkable sodium 133 glucose 150, WBC 13,100, troponin negative, urinalysis negative nitrite, hemoglobin A1c 5.6.  Neurosurgery Dr. Venetia Constable consulted advised conservative care with follow-up MRI 12/14/2020 showing small 3 mm subdural hematoma overlying the posterior right cerebral convexity with extension along the right tentorium no significant mass-effect.  Trace residual subarachnoid hemorrhage most pronounced at the right occipital region.  13 mm evolving nonhemorrhagic cortical contusion at the peripheral right temporal lobe.  No other acute intracranial abnormality.  Echocardiogram with ejection fraction of 60 to 65% no wall motion abnormalities grade 1 diastolic dysfunction.  Patient did have some complaints of left lateral chest wall tenderness felt to be musculoskeletal in nature x-ray rib series negative CTA chest was pending.  Follow-up speech therapy for any signs of dysphagia patient with longstanding history of multiple esophageal issues scheduled to have outpatient esophageal examination.  Found to have no obvious oral issues or overt aspiration maintain on a regular consistency diet.  CT chest 12/13 with posterior rib fxs per report.   Patient's [facility] record [facility] has been reviewed by  the rehabilitation admission coordinator and physician.   Past Medical History      Past Medical History:  Diagnosis Date   Esophageal dysphagia     Functional diarrhea     GERD (gastroesophageal reflux disease)     Hypertension     Hypothyroid     IDA (iron deficiency anemia)     Prediabetes 12/12/2020    Has the patient had major surgery during 100 days prior to admission? No   Family History   family history is not on file.   Current Medications   Current Facility-Administered Medications:    acetaminophen (TYLENOL) tablet 650 mg, 650 mg, Oral, Q6H PRN, 650 mg at 12/17/20 1647 **OR** acetaminophen (TYLENOL) suppository 650 mg, 650 mg, Rectal, Q6H PRN, Shalhoub, Sherryll Burger, MD   escitalopram (LEXAPRO) tablet 20 mg, 20 mg, Oral, Daily, Shalhoub, Sherryll Burger, MD, 20 mg at 12/18/20 0920   gabapentin (NEURONTIN) capsule 300 mg, 300 mg, Oral, QHS, Kathryne Eriksson, NP, 300 mg at 12/17/20 2118   labetalol (NORMODYNE) injection 10 mg, 10 mg, Intravenous, Q2H PRN, Shalhoub, Sherryll Burger, MD   levothyroxine (SYNTHROID) tablet 137  mcg, 137 mcg, Oral, QAC breakfast, Shalhoub, Sherryll Burger, MD, 137 mcg at 12/18/20 0603   lidocaine (LIDODERM) 5 % 1 patch, 1 patch, Transdermal, Daily, Domenic Polite, MD, 1 patch at 12/18/20 3500   meclizine (ANTIVERT) tablet 12.5 mg, 12.5 mg, Oral, BID, Domenic Polite, MD, 12.5 mg at 12/18/20 0920   naproxen (NAPROSYN) tablet 250 mg, 250 mg, Oral, BID WC, Domenic Polite, MD, 250 mg at 12/18/20 1037   ondansetron (ZOFRAN) injection 4 mg, 4 mg, Intravenous, Once, Upstill, Shari, PA-C   ondansetron (ZOFRAN) tablet 4 mg, 4 mg, Oral, Q6H PRN, 4 mg at 12/18/20 0848 **OR** ondansetron (ZOFRAN) injection 4 mg, 4 mg, Intravenous, Q6H PRN, Shalhoub, Sherryll Burger, MD, 4 mg at 12/15/20 1742   pantoprazole (PROTONIX) EC tablet 40 mg, 40 mg, Oral, Daily, Shalhoub, Sherryll Burger, MD, 40 mg at 12/18/20 0920   polyethylene glycol (MIRALAX / GLYCOLAX) packet 17 g, 17 g, Oral, BID, Domenic Polite,  MD   pramipexole (MIRAPEX) tablet 0.25 mg, 0.25 mg, Oral, QHS, Shalhoub, Sherryll Burger, MD, 0.25 mg at 12/17/20 2118   propranolol (INDERAL) tablet 10 mg, 10 mg, Oral, BID, Shalhoub, Sherryll Burger, MD, 10 mg at 12/17/20 2119   senna-docusate (Senokot-S) tablet 1 tablet, 1 tablet, Oral, BID, Domenic Polite, MD, 1 tablet at 12/18/20 0920   traMADol (ULTRAM) tablet 50 mg, 50 mg, Oral, Q6H PRN, Domenic Polite, MD, 50 mg at 12/18/20 9381   Patients Current Diet:  Diet Order                  Diet Carb Modified Fluid consistency: Thin; Room service appropriate? Yes  Diet effective now                       Precautions / Restrictions Precautions Precautions: Fall Precaution Comments: history of falls, dizziness Restrictions Weight Bearing Restrictions: No RUE Weight Bearing: Weight bearing as tolerated LUE Weight Bearing: Weight bearing as tolerated RLE Weight Bearing: Weight bearing as tolerated LLE Weight Bearing: Weight bearing as tolerated    Has the patient had 2 or more falls or a fall with injury in the past year? Yes   Prior Activity Level Community (5-7x/wk): Mod I with cane; drives   Prior Functional Level Self Care: Did the patient need help bathing, dressing, using the toilet or eating? Independent   Indoor Mobility: Did the patient need assistance with walking from room to room (with or without device)? Independent   Stairs: Did the patient need assistance with internal or external stairs (with or without device)? Independent   Functional Cognition: Did the patient need help planning regular tasks such as shopping or remembering to take medications? Independent   Patient Information Are you of Hispanic, Latino/a,or Spanish origin?: A. No, not of Hispanic, Latino/a, or Spanish origin What is your race?: A. White Do you need or want an interpreter to communicate with a doctor or health care staff?: 0. No   Patient's Response To:  Health Literacy and Transportation Is the  patient able to respond to health literacy and transportation needs?: Yes Health Literacy - How often do you need to have someone help you when you read instructions, pamphlets, or other written material from your doctor or pharmacy?: Never In the past 12 months, has lack of transportation kept you from medical appointments or from getting medications?: No In the past 12 months, has lack of transportation kept you from meetings, work, or from getting things needed for daily living?: No  Home Assistive Devices / Equipment Home Assistive Devices/Equipment: Cane (specify quad or straight) Home Equipment: Cane - single point, Shower seat   Prior Device Use: Indicate devices/aids used by the patient prior to current illness, exacerbation or injury?  cane   Current Functional Level Cognition   Overall Cognitive Status: Impaired/Different from baseline Orientation Level: Oriented X4 Following Commands: Follows one step commands with increased time    Extremity Assessment (includes Sensation/Coordination)   Upper Extremity Assessment: LUE deficits/detail LUE Deficits / Details: generalized weakness, likely resulting from pain in L rib cage  Lower Extremity Assessment: Generalized weakness     ADLs         Mobility   Overal bed mobility: Needs Assistance Bed Mobility: Supine to Sit Rolling: Min guard Sidelying to sit: Mod assist, HOB elevated Supine to sit: Min assist, HOB elevated General bed mobility comments: assist for truncal elevation, LE lowering over EOB, and scooting to EOB with assist of bed pad. Cues for gaze stabilization, increased time at each positional change.     Transfers   Overall transfer level: Needs assistance Equipment used: Rolling walker (2 wheels) Transfers: Sit to/from Stand Sit to Stand: Min assist, From elevated surface Bed to/from chair/wheelchair/BSC transfer type:: Step pivot Step pivot transfers: Min assist General transfer comment: Assist for rise,  steady. Cues for safe hand placement when rising.     Ambulation / Gait / Stairs / Wheelchair Mobility   Ambulation/Gait Ambulation/Gait assistance: Herbalist (Feet): 20 Feet Assistive device: Rolling walker (2 wheels) Gait Pattern/deviations: Step-through pattern, Decreased stride length, Trunk flexed General Gait Details: assist to steady, guide pt and RW, cues for upright posture and directing RW. Gait velocity: decr Gait velocity interpretation: <1.31 ft/sec, indicative of household ambulator Pre-gait activities: march in place prior to initiating gait, gaze stabilization during gait     Posture / Balance Dynamic Sitting Balance Sitting balance - Comments: initially poor, transitioning to fair with UE support Balance Overall balance assessment: Needs assistance Sitting-balance support: Feet supported, Single extremity supported Sitting balance-Leahy Scale: Fair Sitting balance - Comments: initially poor, transitioning to fair with UE support Standing balance support: During functional activity Standing balance-Leahy Scale: Poor Standing balance comment: minG with BUE support of RW, external support for walking     Special needs/care consideration Fall precautions    Previous Home Environment  Living Arrangements: Alone  Lives With: Alone Available Help at Discharge:  (to go stay with son and dtr in law at discharge) Type of Home: House Home Layout: One level Home Access: Stairs to enter Entrance Stairs-Rails: Left Entrance Stairs-Number of Steps: 3 Bathroom Shower/Tub: Multimedia programmer: Standard Bathroom Accessibility: Yes How Accessible: Accessible via walker [place]: No Additional Comments: plan to d/c to son's home   Discharge Living Setting Plans for Discharge Living Setting: Other (Comment) (to stay with son at d/c) Type of Home at Discharge: House Discharge Home Layout: Two level, Able to live on main level with  bedroom/bathroom Discharge Home Access: Stairs to enter Discharge Bathroom Shower/Tub: Tub/shower unit Discharge Bathroom Toilet: Standard Discharge Bathroom Accessibility: Yes How Accessible: Accessible via walker Does the patient have any problems obtaining your medications?: No   Social/Family/Support Systems Patient Roles: Parent Contact Information: daughter, [name] and son, Ronalee Belts Anticipated Caregiver: son and daughter in law Anticipated Caregiver's Contact Information: see contacts Caregiver Availability: 24/7 Discharge Plan Discussed with Primary Caregiver: Yes Is Caregiver In Agreement with Plan?: Yes Does Caregiver/Family have Issues with Lodging/Transportation while Pt is  in Rehab?: No   To live with son and his wife at discharge and [place] arranged by acute His Address is [place] [address] phone [phone]   Goals Patient/Family Goal for Rehab: supervision PT, supervision to min OT Expected length of stay: ELOS 7 to 10 days Pt/Family Agrees to Admission and willing to participate: Yes Program Orientation Provided & Reviewed with Pt/Caregiver Including Roles  & Responsibilities: Yes   Decrease burden of Care through IP rehab admission: n/a   Possible need for SNF placement upon discharge: not anticipated   Patient Condition: I have reviewed medical records from [facility] , spoken with CM, and patient and daughter. I met with patient at the bedside for inpatient rehabilitation assessment.  Patient will benefit from ongoing PT and OT, can actively participate in 3 hours of therapy a day 5 days of the week, and can make measurable gains during the admission.  Patient will also benefit from the coordinated team approach during an Inpatient Acute Rehabilitation admission.  The patient will receive intensive therapy as well as Rehabilitation physician, nursing, social worker, and care management interventions.  Due to bladder management, bowel  management, safety, skin/wound care, disease management, medication administration, pain management, and patient education the patient requires 24 hour a day rehabilitation nursing.  The patient is currently min assist overall with mobility and basic ADLs.  Discharge setting and therapy post discharge at home with home health is anticipated.  Patient has agreed to participate in the Acute Inpatient Rehabilitation Program and will admit today.   Preadmission Screen Completed By:  Cleatrice Burke, 12/18/2020 1:10 PM ______________________________________________________________________   Discussed status with Dr. Dagoberto Ligas on 12/18/2020 at 1426 and received approval for admission today.   Admission Coordinator:  Cleatrice Burke, RN, time  4696 Date 12/18/2020    Assessment/Plan: Diagnosis: Does the need for close, 24 hr/day Medical supervision in concert with the patient's rehab needs make it unreasonable for this patient to be served in a less intensive setting? Yes Co-Morbidities requiring supervision/potential complications: posterior rib fractures, fall; R temporal SAH; post concussive symptoms; vertigo;  Due to bladder management, bowel management, safety, skin/wound care, disease management, medication administration, pain management, and patient education, does the patient require 24 hr/day rehab nursing? Yes Does the patient require coordinated care of a physician, rehab nurse, PT, OT, and SLP to address physical and functional deficits in the context of the above medical diagnosis(es)? Yes Addressing deficits in the following areas: balance, endurance, locomotion, strength, transferring, bowel/bladder control, bathing, dressing, feeding, grooming, and toileting Can the patient actively participate in an intensive therapy program of at least 3 hrs of therapy 5 days a week? Yes The potential for patient to make measurable gains while on inpatient rehab is good Anticipated  functional outcomes upon discharge from inpatient rehab: supervision PT, supervision and min assist OT, n/a SLP Estimated rehab length of stay to reach the above functional goals is: 7-10 days  Anticipated discharge destination: Home 10. Overall Rehab/Functional Prognosis: good     MD Signature:           Revision History                                    Note Details  Jan Fireman, MD File Time 12/18/2020  2:51 PM  Author Type Physician Status Signed  Last Editor Courtney Heys, MD Service Physical Medicine and  Rehabilitation

## 2020-12-18 NOTE — H&P (Deleted)
  The note originally documented on this encounter has been moved the the encounter in which it belongs.  

## 2020-12-19 LAB — COMPREHENSIVE METABOLIC PANEL
ALT: 10 U/L (ref 0–44)
AST: 13 U/L — ABNORMAL LOW (ref 15–41)
Albumin: 2.9 g/dL — ABNORMAL LOW (ref 3.5–5.0)
Alkaline Phosphatase: 49 U/L (ref 38–126)
Anion gap: 6 (ref 5–15)
BUN: 12 mg/dL (ref 8–23)
CO2: 31 mmol/L (ref 22–32)
Calcium: 9.3 mg/dL (ref 8.9–10.3)
Chloride: 97 mmol/L — ABNORMAL LOW (ref 98–111)
Creatinine, Ser: 0.75 mg/dL (ref 0.44–1.00)
GFR, Estimated: >60 mL/min (ref >60)
Glucose, Bld: 102 mg/dL — ABNORMAL HIGH (ref 70–99)
Potassium: 4.2 mmol/L (ref 3.5–5.1)
Sodium: 134 mmol/L — ABNORMAL LOW (ref 135–145)
Total Bilirubin: 0.6 mg/dL (ref 0.3–1.2)
Total Protein: 5.7 g/dL — ABNORMAL LOW (ref 6.5–8.1)

## 2020-12-19 LAB — CBC WITH DIFFERENTIAL/PLATELET
Abs Immature Granulocytes: 0.05 10*3/uL (ref 0.00–0.07)
Basophils Absolute: 0.1 10*3/uL (ref 0.0–0.1)
Basophils Relative: 1 %
Eosinophils Absolute: 0.3 10*3/uL (ref 0.0–0.5)
Eosinophils Relative: 4 %
HCT: 34.2 % — ABNORMAL LOW (ref 36.0–46.0)
Hemoglobin: 11.1 g/dL — ABNORMAL LOW (ref 12.0–15.0)
Immature Granulocytes: 1 %
Lymphocytes Relative: 30 %
Lymphs Abs: 2.1 10*3/uL (ref 0.7–4.0)
MCH: 31 pg (ref 26.0–34.0)
MCHC: 32.5 g/dL (ref 30.0–36.0)
MCV: 95.5 fL (ref 80.0–100.0)
Monocytes Absolute: 0.6 10*3/uL (ref 0.1–1.0)
Monocytes Relative: 9 %
Neutro Abs: 3.8 10*3/uL (ref 1.7–7.7)
Neutrophils Relative %: 55 %
Platelets: 272 10*3/uL (ref 150–400)
RBC: 3.58 MIL/uL — ABNORMAL LOW (ref 3.87–5.11)
RDW: 12.6 % (ref 11.5–15.5)
WBC: 6.8 10*3/uL (ref 4.0–10.5)
nRBC: 0 % (ref 0.0–0.2)

## 2020-12-19 MED ORDER — LIDOCAINE 5 % EX PTCH
1.0000 | MEDICATED_PATCH | CUTANEOUS | Status: DC
  Administered 2020-12-19 – 2020-12-24 (×6): 1 via TRANSDERMAL
  Filled 2020-12-19 (×4): qty 1

## 2020-12-19 MED ORDER — MAGNESIUM HYDROXIDE 400 MG/5ML PO SUSP
5.0000 mL | Freq: Every day | ORAL | Status: DC
  Administered 2020-12-19 – 2020-12-20 (×2): 5 mL via ORAL
  Filled 2020-12-19 (×3): qty 30

## 2020-12-19 MED ORDER — MECLIZINE HCL 25 MG PO TABS
12.5000 mg | ORAL_TABLET | Freq: Three times a day (TID) | ORAL | Status: DC
  Administered 2020-12-19 – 2020-12-20 (×3): 12.5 mg via ORAL
  Filled 2020-12-19 (×3): qty 1

## 2020-12-19 MED ORDER — MEGESTROL ACETATE 40 MG PO TABS
40.0000 mg | ORAL_TABLET | Freq: Every day | ORAL | Status: DC
  Administered 2020-12-19 – 2020-12-24 (×6): 40 mg via ORAL
  Filled 2020-12-19 (×6): qty 1

## 2020-12-19 MED ORDER — TOPIRAMATE 25 MG PO TABS
25.0000 mg | ORAL_TABLET | Freq: Every day | ORAL | Status: DC
  Administered 2020-12-19 – 2020-12-20 (×2): 25 mg via ORAL
  Filled 2020-12-19 (×2): qty 1

## 2020-12-19 NOTE — Progress Notes (Signed)
Physical Therapy Session Note  Patient Details  Name: Cheryl Stafford MRN: [id number] Date of Birth: 04/12/1929  Today's Date: 12/19/2020 PT Individual Time: 1300-1340 PT Individual Time Calculation (min): 40 min   Short Term Goals: Week 1:  PT Short Term Goal 1 (Week 1): STG = LTG due to ELOS  Skilled Therapeutic Interventions/Progress Updates:   Pt seen sitting in recliner with kpad on back/ribs. RN at bedside administering medications and applying lidocaine patch to back/ribs. Pt agreeable to PT tx, reports 5/10 back/rib pain. Repositioning, mobility, and distractions provided for pain management. Sit<>Stand to RW from recliner with minA for assisting to power to rise due to painful ribs. Able to stand with minA for balance and RW for support - completed stand<>pivot transfer with minA and RW to w/c with assist needed due to some posterior leaning in standing. Transported downstairs to 79M rehab gym. Instructed in functional gait training where she ambulated ~67ft + ~170ft with CGA and RW on level tile. Speed decreased with short shuffling steps, rigid back and downward gaze - pt denies any episodes of dizziness walk ambulating but limited cervical movement as she attempts to avoid head turns. She refused attempting stairs due to fatigue and from fear of falling, but reports readiness tomorrow to attempt. Returned upstairs to her room where she remained seated in w/c with kpad reapplied to back/ribs. All needs in reach. Informed of upcoming OT session. Pt pleased with her performance this session and reports "feeling better" about her progress and capabilities.   Therapy Documentation Precautions:  Precautions Precautions: Fall Precaution Comments: History of falls, L rib pain, dizziness Restrictions Weight Bearing Restrictions: No General:    Therapy/Group: Individual Therapy  Alger Simons 12/19/2020, 12:29 PM

## 2020-12-19 NOTE — Progress Notes (Signed)
Patient ID: Cheryl Stafford, female   DOB: May 24, 1929, 87 y.o.   MRN: [id number] Met with the patient to review medical status, medications, rehab routine, team conference and plan of care. Discussed secondary risks, fall hx and dietary modification recommendations. Patient reported poor appetite and constipation. MD and RN made aware of issues. Continue to follow along to discharge to address educational needs and collaborate with the team to facilitate preparation for discharge home with son. Margarito Liner

## 2020-12-19 NOTE — Evaluation (Signed)
Occupational Therapy Assessment and Plan  Patient Details  Name: Cheryl Stafford MRN: [id number] Date of Birth: Nov 02, 1929  OT Diagnosis: abnormal posture, acute pain, and muscle weakness (generalized) Rehab Potential: Rehab Potential (ACUTE ONLY): Good ELOS: 7-10 days   Today's Date: 12/19/2020 OT Individual Time: 1000-1100 OT Individual Time Calculation (min): 60 min     Hospital Problem: Principal Problem:   Traumatic subarachnoid hemorrhage   Past Medical History:  Past Medical History:  Diagnosis Date   Esophageal dysphagia    Functional diarrhea    GERD (gastroesophageal reflux disease)    Hypertension    Hypothyroid    IDA (iron deficiency anemia)    Prediabetes 12/12/2020   Past Surgical History:  Past Surgical History:  Procedure Laterality Date   ABDOMINAL HYSTERECTOMY     COLONOSCOPY  06/07/2012   Colonic polyp status post polypectomy, Pancolonic diverticulosis redominantly in the sigmoid colon. Examination was limited due to qualitiy of preparation.   ESOPHAGOGASTRODUODENOSCOPY  07/30/2016   Highly torturous esophagus suggestive of motility disorder/presbyesophagus. Schatzki's ring status post esophageal dilatation. Large hiatal hernia. Mild gastritis.    TOTAL KNEE ARTHROPLASTY  01/02/2015    Assessment & Plan Clinical Impression: Patient is a Cheryl Stafford is a 87 year old right-handed female with history of hypertension, hypothyroidism, iron deficiency anemia, type 2 diabetes mellitus, GERD.  Per chart review patient lives alone.  1 level home 3 steps to entry.  Ambulates with a cane with reports of 4 falls in the last 6 months.  She plans to be discharged to home with her daughter.  Presented 12/11/2020 after a fall while attempting to pick up a package when she fell backwards striking her head.  There was associated headache with nausea but no vomiting.  She denied loss of consciousness.  Cranial CT scan showed minimal right temporal subarachnoid hemorrhage.  No  mass-effect or midline shift.  No acute traumatic cervical spine pathology.  Admission chemistries unremarkable sodium 133 glucose 150, WBC 13,100, troponin negative, urinalysis negative nitrite, hemoglobin A1c 5.6.  Neurosurgery Dr. Venetia Constable consulted advised conservative care with follow-up MRI 12/14/2020 showing small 3 mm subdural hematoma overlying the posterior right cerebral convexity with extension along the right tentorium no significant mass-effect.  Trace residual subarachnoid hemorrhage most pronounced at the right occipital region.  13 mm evolving nonhemorrhagic cortical contusion at the peripheral right temporal lobe.  No other acute intracranial abnormality.  Echocardiogram with ejection fraction of 60 to 65% no wall motion abnormalities grade 1 diastolic dysfunction.  Patient did have some complaints of left lateral chest wall tenderness felt to be musculoskeletal in nature x-ray rib series negative CTA chest did show a posterior rib fracture advised conservative care.  Follow-up speech therapy for any signs of dysphagia patient with longstanding history of multiple esophageal issues scheduled to have outpatient esophageal examination.  Found to have no obvious oral issues or overt aspiration maintain on a regular consistency diet. Patient transferred to CIR on 12/18/2020 .    Patient currently requires max with basic self-care skills secondary to muscle weakness, decreased cardiorespiratoy endurance, decreased coordination, and decreased sitting balance, decreased standing balance, decreased postural control, and decreased balance strategies.  Prior to hospitalization, patient could complete ADLs/IADLs with modified independent .  Patient will benefit from skilled intervention to increase independence with basic self-care skills prior to discharge home with care partner.  Anticipate patient will require 24 hour supervision and follow up home health.  OT - End of Session Activity Tolerance:  Tolerates 10 - 20 min  activity with multiple rests Endurance Deficit: Yes Endurance Deficit Description: Frequent seated rest breaks required due to rib pain and dizziness OT Assessment Rehab Potential (ACUTE ONLY): Good OT Patient demonstrates impairments in the following area(s): Balance;Endurance;Pain;Skin Integrity;Motor OT Basic ADL's Functional Problem(s): Bathing;Dressing;Toileting OT Transfers Functional Problem(s): Toilet;Tub/Shower OT Plan OT Intensity: Minimum of 1-2 x/day, 45 to 90 minutes OT Frequency: 5 out of 7 days OT Duration/Estimated Length of Stay: 7-10 days OT Treatment/Interventions: Balance/vestibular training;Disease mangement/prevention;Neuromuscular re-education;Patient/family education;Self Care/advanced ADL retraining;Therapeutic Exercise;UE/LE Coordination activities;Functional mobility training;Discharge planning;DME/adaptive equipment instruction;Pain management;Psychosocial support;Therapeutic Activities;UE/LE Strength taining/ROM OT Basic Self-Care Anticipated Outcome(s): setup-supervision OT Toileting Anticipated Outcome(s): setup OT Bathroom Transfers Anticipated Outcome(s): supervision OT Recommendation Recommendations for Other Services: Vestibular eval Patient destination: Home Follow Up Recommendations: Home health OT Equipment Details: Pt has a shower chair   OT Evaluation Precautions/Restrictions  Precautions Precautions: Fall Precaution Comments: History of falls, L rib pain, dizziness Restrictions Weight Bearing Restrictions: No General Chart Reviewed: Yes Pain Pain Assessment Pain Scale: 0-10 Pain Score: 5  Pain Type: Acute pain Pain Location: Rib cage Pain Orientation: Left Pain Descriptors / Indicators: Aching;Sharp Pain Onset: On-going Pain Intervention(s): RN made aware;Repositioned Home Living/Prior [place] expects to be discharged to:: Private residence Living Arrangements: Alone Available  Help at Discharge: Family (son and 2 daughter live close by per pt) Type of Home: House Home Access: Stairs to enter Technical brewer of Steps: 3 Entrance Stairs-Rails: None Home Layout: One level Bathroom Shower/Tub: Optometrist: Yes Additional Comments: Plan to discharge to son's home until she's able to return to back to her house; 2 story house with first floor setup; tub shower; 5 narrow steps with no rails to enter home  Lives With: Alone Prior Function Level of Independence: Independent with gait, Independent with basic ADLs, Independent with transfers, Independent with homemaking with ambulation, Requires assistive device for independence (pt reports using walking stick and cane)  Able to Take Stairs?: Yes Driving: Yes Vocation: Retired Leisure: Hobbies-yes (Comment) (hanging out with her best friend) Vision Baseline Vision/History: 1 Wears glasses (for reading only) Ability to See in Adequate Light: 0 Adequate Patient Visual Report: No change from baseline Vision Assessment?: No apparent visual deficits Perception  Perception: Within Functional Limits Praxis Praxis: Intact Cognition Overall Cognitive Status: Within Functional Limits for tasks assessed Arousal/Alertness: Awake/alert Orientation Level: Person;Place;Situation Person: Oriented Place: Oriented Situation: Oriented Year: 2022 Month: December Day of Week: Correct Memory: Appears intact Immediate Memory Recall: Sock;Blue;Bed Memory Recall Sock: Without Cue Memory Recall Blue: Without Cue Memory Recall Bed: Without Cue Attention: Focused;Sustained Focused Attention: Appears intact Sustained Attention: Appears intact Awareness: Appears intact Problem Solving: Impaired Problem Solving Impairment: Functional complex;Verbal complex Safety/Judgment: Appears intact Sensation Sensation Light Touch: Appears Intact Hot/Cold: Appears  Intact Proprioception: Appears Intact Stereognosis: Appears Intact Coordination Gross Motor Movements are Fluid and Coordinated: No Fine Motor Movements are Fluid and Coordinated: No (mild tremor bilateral hands) Coordination and Movement Description: Limited by rib pain and dizziness Finger Nose Finger Test: limited by pain in left ribcage Heel Shin Test: WNL Motor  Motor Motor: Other (comment) Motor - Skilled Clinical Observations: Generalized weakness and deconditioning with associated dizziness and rib fx pain  Trunk/Postural Assessment  Cervical Assessment Cervical Assessment: Exceptions to [facility] (forward head) Thoracic Assessment Thoracic Assessment:  (rounded shoulders) Lumbar Assessment Lumbar Assessment:  (posterior pelvic tilt) Postural Control Postural Control: Deficits on evaluation  Balance Balance Balance Assessed: Yes Static Sitting Balance Static Sitting - Balance Support:  No upper extremity supported;Feet supported Static Sitting - Level of Assistance: 5: Stand by assistance Dynamic Sitting Balance Dynamic Sitting - Balance Support: Feet supported;During functional activity Dynamic Sitting - Level of Assistance: 4: Min assist Static Standing Balance Static Standing - Balance Support: Bilateral upper extremity supported Static Standing - Level of Assistance: 4: Min assist Dynamic Standing Balance Dynamic Standing - Balance Support: During functional activity;No upper extremity supported Dynamic Standing - Level of Assistance: 4: Min assist;3: Mod assist Extremity/Trunk Assessment RUE Assessment RUE Assessment: Exceptions to [facility] Active Range of Motion (AROM) Comments: WFL General Strength Comments: DNT Shoulders or elbows due to rib fracture pain; grip 4-/5 LUE Assessment LUE Assessment: Exceptions to [facility] Active Range of Motion (AROM) Comments: WFL General Strength Comments: DNT Shoulders or elbows due to rib fracture pain; grip 4-/5  Care Tool Care Tool  Self Care Eating   Eating Assist Level: Set up assist    Oral Care    Oral Care Assist Level: Set up assist    Bathing         Assist Level: Maximal Assistance - Patient 24 - 49%    Upper Body Dressing(including orthotics)       Assist Level: Moderate Assistance - Patient 50 - 74%    Lower Body Dressing (excluding footwear)   What is the patient wearing?: Pants;Underwear/pull up Assist for lower body dressing: Maximal Assistance - Patient 25 - 49%    Putting on/Taking off footwear   What is the patient wearing?: Non-skid slipper socks Assist for footwear: Total Assistance - Patient < 25%       Care Tool Toileting Toileting activity   Assist for toileting: Maximal Assistance - Patient 25 - 49%     Care Tool Bed Mobility Roll left and right activity        Sit to lying activity        Lying to sitting on side of bed activity         Care Tool Transfers Sit to stand transfer        Chair/bed transfer         Toilet transfer   Assist Level: Minimal Assistance - Patient > 75%     Care Tool Cognition  Expression of Ideas and Wants Expression of Ideas and Wants: 3. Some difficulty - exhibits some difficulty with expressing needs and ideas (e.g, some words or finishing thoughts) or speech is not clear  Understanding Verbal and Non-Verbal Content Understanding Verbal and Non-Verbal Content: 3. Usually understands - understands most conversations, but misses some part/intent of message. Requires cues at times to understand   Memory/Recall Ability Memory/Recall Ability : That he or she is in a hospital/hospital unit;Location of own room;Current season   Refer to Care Plan for [place] 1 OT Short Term Goal 1 (Week 1): STGs=LTGs due to ELOS  Recommendations for other services: None    Skilled Therapeutic Intervention ADL ADL Lower Body Dressing: Maximal assistance Where Assessed-Lower Body Dressing: Other (Comment) (bedside  commode) Toileting: Maximal assistance Where Assessed-Toileting: Bedside Commode Toilet Transfer: Minimal assistance Toilet Transfer Method: Stand pivot Toilet Transfer Equipment: Bedside commode Mobility  Bed Mobility Bed Mobility: Supine to Sit Rolling Right: Minimal Assistance - Patient > 75% Rolling Left: Minimal Assistance - Patient > 75% Supine to Sit: Minimal Assistance - Patient > 75%;Moderate Assistance - Patient 50-74% (rib fx pain limiting trunk elevation to upright) Transfers Sit to Stand: Minimal Assistance - Patient > 75% Stand  to Sit: Minimal Assistance - Patient > 75%  Skilled Intervention:  Pt sitting up in recliner, c/o 5/10 pain left ribcage; nurse made aware also requested kpad to be orderd, and Tylenol administered at beginning of IE.  Completed IE and collaborated with patient regarding OT POC and initiated dc planning.  Pt requesting to use bathroom, upon standing with min assist pt reports incontinent (she also reports this is her baseline).  Stand pivot to [facility] and completed toileting and LB dressing completed per above levels of assist.  Pt returned to recliner, BLE elevated, pillows placed under BUE for increased support and reduce pain.  Call bell in reach, seat alarm on.   Discharge Criteria: Patient will be discharged from OT if patient refuses treatment 3 consecutive times without medical reason, if treatment goals not met, if there is a change in medical status, if patient makes no progress towards goals or if patient is discharged from hospital.  The above assessment, treatment plan, treatment alternatives and goals were discussed and mutually agreed upon: by patient  Ezekiel Slocumb 12/19/2020, 12:59 PM

## 2020-12-19 NOTE — Progress Notes (Signed)
PROGRESS NOTE   Subjective/Complaints: Complains of rib and low back pain: lidocaine patches ordered for both   Objective:   CT CHEST WO CONTRAST  Result Date: 12/18/2020 CLINICAL DATA:  Fall, persistent chest pain EXAM: CT CHEST WITHOUT CONTRAST TECHNIQUE: Multidetector CT imaging of the chest was performed following the standard protocol without IV contrast. COMPARISON:  None. FINDINGS: Cardiovascular: Aortic atherosclerosis. Normal heart size. Three-vessel coronary artery calcifications. No pericardial effusion. Mediastinum/Nodes: No enlarged mediastinal, hilar, or axillary lymph nodes. Large hiatal hernia with intrathoracic position of the gastric body and fundus. Thyroid gland, trachea, and esophagus demonstrate no significant findings. Lungs/Pleura: Lungs are clear.  Small left pleural effusion. Upper Abdomen: No acute abnormality. Small gallstone in the dependent gallbladder. Musculoskeletal: No chest wall mass or suspicious bone lesions identified. Minimally displaced fractures of the posterior left eighth, ninth, and tenth ribs. IMPRESSION: 1. Minimally displaced fractures of the posterior left eighth, ninth, and tenth ribs. 2. Small associated left pleural effusion. 3. Large hiatal hernia. 4. Cholelithiasis. 5. Coronary artery disease. Aortic Atherosclerosis (ICD10-I70.0). Electronically Signed   By: Delanna Ahmadi M.D.   On: 12/18/2020 14:24   Recent Labs    12/17/20 0448 12/19/20 0535  WBC 7.2 6.8  HGB 10.7* 11.1*  HCT 32.4* 34.2*  PLT 236 272   Recent Labs    12/17/20 0448 12/19/20 0535  NA 135 134*  K 3.9 4.2  CL 99 97*  CO2 28 31  GLUCOSE 104* 102*  BUN 22 12  CREATININE 0.77 0.75  CALCIUM 8.8* 9.3    Intake/Output Summary (Last 24 hours) at 12/19/2020 1048 Last data filed at 12/19/2020 0745 Gross per 24 hour  Intake 360 ml  Output --  Net 360 ml        Physical Exam: Vital Signs Blood pressure  118/62, pulse 64, temperature 97.8 F (36.6 C), resp. rate 18, height 5\' 3"  (1.6 m), weight 57.1 kg, SpO2 98 %. Gen: no distress, normal appearing HEENT: oral mucosa pink and moist, NCAT Cardio: Reg rate Pulmonary:     Comments: Hard ot get deep breath, however CTA B/L- no W/R/R Abdominal:     Comments: Soft, NT; somewhat distended vs protuberant; hypoactive BS  Musculoskeletal:     Cervical back: Normal range of motion. No rigidity.     Comments: UE and LE strength 5-/5; tested Deltoid, biceps, triceps, WE< ,grip and finger abdction; as well as HF, KE, KF DF and PF  Skin:    Comments: No bruising on anterior L chest wall; skin OK; dry.warm  Neurological:     Comments: Patient is alert pleasant and cooperative.  Follows simple commands.  Makes eye contact with examiner.  Provides name and age.  She was able to recall events of her fall. Nystagmus to right side only Intact to light touch in all 4 extremities B/L   Psychiatric:        Mood and Affect: Mood normal.        Behavior: Behavior normal.    Assessment/Plan: 1. Functional deficits which require 3+ hours per day of interdisciplinary therapy in a comprehensive inpatient rehab setting. Physiatrist is providing close team supervision and 24 hour management of  active medical problems listed below. Physiatrist and rehab team continue to assess barriers to discharge/monitor patient progress toward functional and medical goals  Care Tool:  Bathing              Bathing assist       Upper Body Dressing/Undressing Upper body dressing        Upper body assist      Lower Body Dressing/Undressing Lower body dressing            Lower body assist       Toileting Toileting    Toileting assist       Transfers Chair/bed transfer  Transfers assist     Chair/bed transfer assist level: Minimal Assistance - Patient > 75%     Locomotion Ambulation   Ambulation assist      Assist level: Minimal Assistance  - Patient > 75% Assistive device: Walker-rolling Max distance: 34ft   Walk 10 feet activity   Assist     Assist level: Minimal Assistance - Patient > 75% Assistive device: Walker-rolling   Walk 50 feet activity   Assist Walk 50 feet with 2 turns activity did not occur: Safety/medical concerns (rib pain and dizziness)         Walk 150 feet activity   Assist Walk 150 feet activity did not occur: Safety/medical concerns         Walk 10 feet on uneven surface  activity   Assist Walk 10 feet on uneven surfaces activity did not occur: Safety/medical concerns         Wheelchair     Assist Is the patient using a wheelchair?: Yes Type of Wheelchair: Manual    Wheelchair assist level: Dependent - Patient 0% Max wheelchair distance: 28ft    Wheelchair 50 feet with 2 turns activity    Assist    Wheelchair 50 feet with 2 turns activity did not occur: Safety/medical concerns       Wheelchair 150 feet activity     Assist  Wheelchair 150 feet activity did not occur: Safety/medical concerns       Blood pressure 118/62, pulse 64, temperature 97.8 F (36.6 C), resp. rate 18, height 5\' 3"  (1.6 m), weight 57.1 kg, SpO2 98 %.  Medical Problem List and Plan: 1. Functional deficits with decreased in gait and dizziness secondary to traumatic SAH             -patient may  shower             -ELOS/Goals: 7-10 days supervision to min A  -Interdisciplinary Team Conference today   2.  Antithrombotics: -DVT/anticoagulation:  Mechanical: Antiembolism stockings, thigh (TED hose) Bilateral lower extremities             -antiplatelet therapy: N/A 3. Pain Management: Neurontin 300 mg nightly, Lidoderm patch as directed, tramadol as needed 4. Mood: Lexapro 20 mg daily             -antipsychotic agents: N/A 5. Neuropsych: This patient is capable of making decisions on her own behalf. 6. Skin/Wound Care: Routine skin checks 7. Fluids/Electrolytes/Nutrition:  Routine in and outs with follow-up chemistries 8.  Hypothyroidism.  Synthroid 9.  Hypertension.  Inderal 10 mg twice daily 10.  Restless leg syndrome.  Continue Requip/Mirapex 0.25 mg nightly 11. Constipation- LBM 1 week ago- had miralax x2 days- if no BM, needs Sorbitol. 12. Vertigo: continue meclizine 12.5mg  BID. Vestibular eval and treatment.  13. Headache: schedule Topamax 25mg  daily 14. Decreased appetite: discussed  [facility]. May be related to constipation as well.     LOS: 1 days A FACE TO FACE EVALUATION WAS PERFORMED  Clide Deutscher Shannia Jacuinde 12/19/2020, 10:48 AM

## 2020-12-19 NOTE — Progress Notes (Signed)
12/14[facility] notified of change in transition plan to [place] [facility] rehab- if pt still need [place] services post CIR stay please contact them again for [facility] referral.

## 2020-12-19 NOTE — Progress Notes (Signed)
Inpatient [facility] Individual Statement of Services  Patient Name:  Cheryl Stafford  Date:  12/19/2020  Welcome to the [facility].  Our goal is to provide you with an individualized program based on your diagnosis and situation, designed to meet your specific needs.  With this comprehensive rehabilitation program, you will be expected to participate in at least 3 hours of rehabilitation therapies Monday-Friday, with modified therapy programming on the weekends.  Your rehabilitation program will include the following services:  Physical Therapy (PT), Occupational Therapy (OT), 24 hour per day rehabilitation nursing, Therapeutic Recreaction (TR), Care Coordinator, Rehabilitation Medicine, Nutrition Services, and Pharmacy Services  Weekly team conferences will be held on Wednesday to discuss your progress.  Your Inpatient Rehabilitation Care Coordinator will talk with you frequently to get your input and to update you on team discussions.  Team conferences with you and your family in attendance may also be held.  Expected length of stay: 7-10 days  Overall anticipated outcome: supervision with cues  Depending on your progress and recovery, your program may change. Your Inpatient Rehabilitation Care Coordinator will coordinate services and will keep you informed of any changes. Your Inpatient Rehabilitation Care Coordinator's name and contact numbers are listed  below.  The following services may also be recommended but are not provided by the [facility] will be made to provide these services after discharge if needed.  Arrangements include referral to agencies that provide these services.  Your insurance has been verified to be:  [place] Your primary doctor is:  Nelda Bucks  Pertinent  information will be shared with your doctor and your insurance company.  Inpatient Rehabilitation Care Coordinator:  Ovidio Kin, [place] or Emilia Beck  Information discussed with and copy given to patient by: Elease Hashimoto, 12/19/2020, 10:21 AM

## 2020-12-19 NOTE — Evaluation (Signed)
Physical Therapy Assessment and Plan  Patient Details  Name: Cheryl Stafford MRN: [id number] Date of Birth: 01-04-1930  PT Diagnosis: Abnormal posture, Abnormality of gait, Difficulty walking, Muscle weakness, and Pain in L ribs and head Rehab Potential: Good ELOS: 7-10 days   Today's Date: 12/19/2020 PT Individual Time: 3559-7416 PT Individual Time Calculation (min): 55 min    Hospital Problem: Principal Problem:   Traumatic subarachnoid hemorrhage   Past Medical History:  Past Medical History:  Diagnosis Date   Esophageal dysphagia    Functional diarrhea    GERD (gastroesophageal reflux disease)    Hypertension    Hypothyroid    IDA (iron deficiency anemia)    Prediabetes 12/12/2020   Past Surgical History:  Past Surgical History:  Procedure Laterality Date   ABDOMINAL HYSTERECTOMY     COLONOSCOPY  06/07/2012   Colonic polyp status post polypectomy, Pancolonic diverticulosis redominantly in the sigmoid colon. Examination was limited due to qualitiy of preparation.   ESOPHAGOGASTRODUODENOSCOPY  07/30/2016   Highly torturous esophagus suggestive of motility disorder/presbyesophagus. Schatzki's ring status post esophageal dilatation. Large hiatal hernia. Mild gastritis.    TOTAL KNEE ARTHROPLASTY  01/02/2015    Assessment & Plan Clinical Impression: Patient is a 87 year old right-handed female with history of hypertension, hypothyroidism, iron deficiency anemia, type 2 diabetes mellitus, GERD.  Per chart review patient lives alone.  1 level home 3 steps to entry.  Ambulates with a cane with reports of 4 falls in the last 6 months.  She plans to be discharged to home with her daughter.  Presented 12/11/2020 after a fall while attempting to pick up a package when she fell backwards striking her head.  There was associated headache with nausea but no vomiting.  She denied loss of consciousness.  Cranial CT scan showed minimal right temporal subarachnoid hemorrhage.  No mass-effect or  midline shift.  No acute traumatic cervical spine pathology.  Admission chemistries unremarkable sodium 133 glucose 150, WBC 13,100, troponin negative, urinalysis negative nitrite, hemoglobin A1c 5.6.  Neurosurgery Dr. Venetia Constable consulted advised conservative care with follow-up MRI 12/14/2020 showing small 3 mm subdural hematoma overlying the posterior right cerebral convexity with extension along the right tentorium no significant mass-effect.  Trace residual subarachnoid hemorrhage most pronounced at the right occipital region.  13 mm evolving nonhemorrhagic cortical contusion at the peripheral right temporal lobe.  No other acute intracranial abnormality.  Echocardiogram with ejection fraction of 60 to 65% no wall motion abnormalities grade 1 diastolic dysfunction.  Patient did have some complaints of left lateral chest wall tenderness felt to be musculoskeletal in nature x-ray rib series negative CTA chest did show a posterior rib fracture advised conservative care.  Follow-up speech therapy for any signs of dysphagia patient with longstanding history of multiple esophageal issues scheduled to have outpatient esophageal examination.  Found to have no obvious oral issues or overt aspiration maintain on a regular consistency diet.  Therapy evaluations completed due to patient decreased functional ability altered mental status was admitted for a comprehensive rehab program.Patient transferred to [facility] on 12/18/2020 .   Patient currently requires min with mobility secondary to muscle weakness, decreased cardiorespiratoy endurance, central origin, and decreased sitting balance, decreased standing balance, decreased postural control, and decreased balance strategies.  Prior to hospitalization, patient was modified independent  with mobility and lived with Alone in a House home.  Home access is 3Stairs to enter.  Patient will benefit from skilled PT intervention to maximize safe functional mobility, minimize fall  risk, and decrease  caregiver burden for planned discharge home with 24 hour supervision.  Anticipate patient will benefit from follow up [place] at discharge.  PT - End of Session Activity Tolerance: Tolerates 10 - 20 min activity with multiple rests Endurance Deficit: Yes Endurance Deficit Description: Frequent seated rest breaks required due to rib pain and dizziness PT Assessment Rehab Potential (ACUTE/IP ONLY): Good PT Barriers to Discharge: Incontinence;Insurance for SNF coverage;Other (comments) PT Barriers to Discharge Comments: pain, dizziness PT Patient demonstrates impairments in the following area(s): Balance;Endurance;Pain;Safety;Motor PT Transfers Functional Problem(s): Bed Mobility;Bed to Chair;Car PT Locomotion Functional Problem(s): Ambulation;Stairs PT Plan PT Intensity: Minimum of 1-2 x/day ,45 to 90 minutes PT Frequency: 5 out of 7 days PT Duration Estimated Length of Stay: 7-10 days PT Treatment/Interventions: Ambulation/gait training;Balance/vestibular training;Cognitive remediation/compensation;Community reintegration;Discharge planning;Disease management/prevention;DME/adaptive equipment instruction;Functional electrical stimulation;Functional mobility training;Neuromuscular re-education;Pain management;Patient/family education;Psychosocial support;Skin care/wound management;Splinting/orthotics;Stair training;Therapeutic Activities;Therapeutic Exercise;UE/LE Strength taining/ROM;UE/LE Coordination activities;Visual/perceptual remediation/compensation;Wheelchair propulsion/positioning PT Transfers Anticipated Outcome(s): supervision with LRAD PT Locomotion Anticipated Outcome(s): supervision with LRAD PT Recommendation Follow Up Recommendations: Home health PT;24 hour supervision/assistance Patient destination: Home Equipment Recommended: To be determined   PT Evaluation Precautions/Restrictions Precautions Precautions: Fall Precaution Comments: History of falls, L rib  pain, dizziness Restrictions Weight Bearing Restrictions: No Pain Pain Assessment Pain Scale: 0-10 Pain Score: 5  Pain Type: Acute pain Pain Location: Rib cage Pain Orientation: Left Pain Descriptors / Indicators: Aching;Discomfort Pain Frequency: Constant Pain Onset: On-going Pain Intervention(s): Ambulation/increased activity;Emotional support;Distraction Pain Interference Pain Interference Pain Effect on Sleep: 2. Occasionally Pain Interference with Therapy Activities: 4. Almost constantly Pain Interference with Day-to-Day Activities: 4. Almost constantly Home Living/Prior Functioning Home Living Available Help at Discharge: Family (son and 2 daughters) Type of Home: House Home Access: Stairs to enter Technical brewer of Steps: 3 Entrance Stairs-Rails: None Home Layout: One level Bathroom Shower/Tub: Chiropodist: Standard Bathroom Accessibility: Yes Additional Comments: Plan to discharge to son's home until she's able to return to back to her house  Lives With: Alone Prior Function Level of Independence: Independent with gait;Independent with basic ADLs;Independent with transfers;Independent with homemaking with ambulation  Able to Take Stairs?: Yes (reliant on 2 rails) Driving: Yes Vocation: Retired Leisure: Hobbies-yes (Comment) (hanging out with her best friend) Vision/Perception  Vision - History Ability to See in Adequate Light: 0 Adequate Perception Perception: Within Functional Limits Praxis Praxis: Intact  Cognition Overall Cognitive Status: Within Functional Limits for tasks assessed Arousal/Alertness: Awake/alert Orientation Level: Oriented X4 Attention: Focused;Sustained Focused Attention: Appears intact Sustained Attention: Appears intact Memory: Appears intact Awareness: Appears intact Problem Solving: Impaired Problem Solving Impairment: Functional complex Safety/Judgment: Appears intact Sensation Sensation Light  Touch: Appears Intact Hot/Cold: Appears Intact Proprioception: Appears Intact Stereognosis: Appears Intact Coordination Gross Motor Movements are Fluid and Coordinated: No Coordination and Movement Description: Limited by rib pain and dizziness Heel Shin Test: WNL Motor  Motor Motor: Other (comment) Motor - Skilled Clinical Observations: Generalized weakness and deconditioning with associated dizziness and rib fx pain  Trunk/Postural Assessment  Cervical Assessment Cervical Assessment: Exceptions to [facility] (forward head) Thoracic Assessment Thoracic Assessment: Exceptions to [facility] (rounded shoulders) Lumbar Assessment Lumbar Assessment: Exceptions to [facility] (posterior pelvic tilt) Postural Control Postural Control: Deficits on evaluation (limited by rib pain and dizziness with some mild sway in standing)  Balance Balance Balance Assessed: Yes Static Sitting Balance Static Sitting - Balance Support: No upper extremity supported;Feet supported Static Sitting - Level of Assistance: 5: Stand by assistance Dynamic Sitting Balance Dynamic Sitting - Balance Support: Feet supported;During functional activity  Dynamic Sitting - Level of Assistance: 4: Min Insurance risk surveyor Standing - Balance Support: Bilateral upper extremity supported Static Standing - Level of Assistance: 4: Min assist Dynamic Standing Balance Dynamic Standing - Balance Support: During functional activity;No upper extremity supported Dynamic Standing - Level of Assistance: 4: Min assist;3: Mod assist Extremity Assessment      RLE Assessment RLE Assessment: Exceptions to [facility] General Strength Comments: Grossly 4/5 LLE Assessment LLE Assessment: Exceptions to [facility] General Strength Comments: Grossly 4/5  Care Tool Care Tool Bed Mobility Roll left and right activity   Roll left and right assist level: Minimal Assistance - Patient > 75%    Sit to lying activity   Sit to lying assist level: Moderate  Assistance - Patient 50 - 74%    Lying to sitting on side of bed activity   Lying to sitting on side of bed assist level: the ability to move from lying on the back to sitting on the side of the bed with no back support.: Moderate Assistance - Patient 50 - 74%     Care Tool Transfers Sit to stand transfer   Sit to stand assist level: Minimal Assistance - Patient > 75%    Chair/bed transfer   Chair/bed transfer assist level: Minimal Assistance - Patient > 75%     Psychologist, counselling transfer activity did not occur: Safety/medical concerns        Care Tool Locomotion Ambulation   Assist level: Minimal Assistance - Patient > 75% Assistive device: Walker-rolling Max distance: 52f  Walk 10 feet activity   Assist level: Minimal Assistance - Patient > 75% Assistive device: Walker-rolling   Walk 50 feet with 2 turns activity Walk 50 feet with 2 turns activity did not occur: Safety/medical concerns (rib pain and dizziness)      Walk 150 feet activity Walk 150 feet activity did not occur: Safety/medical concerns      Walk 10 feet on uneven surfaces activity Walk 10 feet on uneven surfaces activity did not occur: Safety/medical concerns      Stairs Stair activity did not occur: Safety/medical concerns        Walk up/down 1 step activity Walk up/down 1 step or curb (drop down) activity did not occur: Safety/medical concerns      Walk up/down 4 steps activity Walk up/down 4 steps activity did not occur: Safety/medical concerns      Walk up/down 12 steps activity Walk up/down 12 steps activity did not occur: Safety/medical concerns      Pick up small objects from floor Pick up small object from the floor (from standing position) activity did not occur: Safety/medical concerns      Wheelchair Is the patient using a wheelchair?: Yes Type of Wheelchair: Manual   Wheelchair assist level: Dependent - Patient 0% Max wheelchair distance: 259f Wheel 50 feet  with 2 turns activity Wheelchair 50 feet with 2 turns activity did not occur: Safety/medical concerns    Wheel 150 feet activity Wheelchair 150 feet activity did not occur: Safety/medical concerns      Refer to Care Plan for Long Term Goals  SHORT TERM GOAL WEEK 1 PT Short Term Goal 1 (Week 1): STG = LTG due to ELOS  Recommendations for other services: None   Skilled Therapeutic Intervention Mobility Bed Mobility Bed Mobility: Supine to Sit Rolling Right: Minimal Assistance - Patient > 75% Rolling Left: Minimal Assistance - Patient > 75%  Supine to Sit: Minimal Assistance - Patient > 75%;Moderate Assistance - Patient 50-74% (rib fx pain limiting trunk elevation to upright) Transfers Transfers: Sit to Stand;Stand to Sit;Stand Pivot Transfers Sit to Stand: Minimal Assistance - Patient > 75% Stand to Sit: Minimal Assistance - Patient > 75% Stand Pivot Transfers: Minimal Assistance - Patient > 75% Stand Pivot Transfer Details: Verbal cues for sequencing;Verbal cues for technique;Verbal cues for precautions/safety;Verbal cues for gait pattern;Verbal cues for safe use of DME/AE;Manual facilitation for weight shifting;Manual facilitation for placement;Tactile cues for posture Transfer (Assistive device): Rolling walker Locomotion  Gait Ambulation: Yes Gait Assistance: Minimal Assistance - Patient > 75% Gait Distance (Feet): 15 Feet Assistive device: Rolling walker Gait Assistance Details: Verbal cues for precautions/safety;Verbal cues for gait pattern;Verbal cues for technique;Verbal cues for sequencing;Verbal cues for safe use of DME/AE;Manual facilitation for weight shifting;Tactile cues for weight shifting;Tactile cues for sequencing;Visual cues for safe use of DME/AE Gait Gait: Yes Gait Pattern: Impaired Gait Pattern: Step-through pattern;Shuffle;Trunk flexed;Narrow base of support Gait velocity: decr Stairs / Additional Locomotion Stairs: No Wheelchair Mobility Wheelchair  Mobility: No  Skilled Intervention: Pt presents semi-reclined in bed at start of session. She's agreeable to PT evaluation with functional mobility completed as outlined above. Pt very pleasant and motivated to return home as soon as she can. Retrieved 16x16 w/c from DME closet. Pt requesting assistance in getting dressed and out of hospital gown. Required maxA for lower body dressing in sitting/standing due to reliance of UE support through RW and inability to pull pants over hips due to LOB. She also required modA for donning shirt due to L rib pain and dizziness. Overall, she required modA for supine>sit (primarily due to L rib pain and dizziness), minA for forward scooting to EOB, minA for sit<>stand transfer to RW, and minA for stand<>pivot transfers to RW. She was able to ambulate short distances (~5f) with minA and RW with distances primarily limited by dizziness and rib pain. She concluded session seated in recliner with all immediate needs within reach, safety belt alarm on.   Instructed pt in results of PT evaluation as detailed above, PT POC, rehab potential, rehab goals, and discharge recommendations. Additionally discussed CIR's policies regarding fall safety and use of chair alarm and/or quick release belt. Pt verbalized understanding and in agreement. Will update pt's family members as they become available.   Discharge Criteria: Patient will be discharged from PT if patient refuses treatment 3 consecutive times without medical reason, if treatment goals not met, if there is a change in medical status, if patient makes no progress towards goals or if patient is discharged from hospital.  The above assessment, treatment plan, treatment alternatives and goals were discussed and mutually agreed upon: by patient  CAlger SimonsPT, DPT 12/19/2020, 9:01 AM

## 2020-12-19 NOTE — Progress Notes (Signed)
Inpatient Rehabilitation  Patient information reviewed and entered into eRehab system by Ellina Sivertsen M. Anees Vanecek, M.A., CCC/SLP, PPS Coordinator.  Information including medical coding, functional ability and quality indicators will be reviewed and updated through discharge.    

## 2020-12-19 NOTE — Progress Notes (Signed)
Inpatient Rehabilitation Care Coordinator Assessment and Plan Patient Details  Name: Cheryl Stafford MRN: [id number] Date of Birth: 03/23/1929  Today's Date: 12/19/2020  Hospital Problems: Principal Problem:   Traumatic subarachnoid hemorrhage  Past Medical History:  Past Medical History:  Diagnosis Date   Esophageal dysphagia    Functional diarrhea    GERD (gastroesophageal reflux disease)    Hypertension    Hypothyroid    IDA (iron deficiency anemia)    Prediabetes 12/12/2020   Past Surgical History:  Past Surgical History:  Procedure Laterality Date   ABDOMINAL HYSTERECTOMY     COLONOSCOPY  06/07/2012   Colonic polyp status post polypectomy, Pancolonic diverticulosis redominantly in the sigmoid colon. Examination was limited due to qualitiy of preparation.   ESOPHAGOGASTRODUODENOSCOPY  07/30/2016   Highly torturous esophagus suggestive of motility disorder/presbyesophagus. Schatzki's ring status post esophageal dilatation. Large hiatal hernia. Mild gastritis.    TOTAL KNEE ARTHROPLASTY  01/02/2015   Social History:  reports that she has never smoked. She has never used smokeless tobacco. She reports that she does not drink alcohol and does not use drugs.  Family / Support Systems Marital Status: Widow/Widower Patient Roles: Parent, Other (Comment) (sibling) Children: Roel Cluck [phone]  Julie-daughter [phone] Other Supports: Sister Anticipated Caregiver: Son and daughter in-law Ability/Limitations of Caregiver: Son is a retired Public relations account executive and daughter in-law retired also. Can provide assist if needed. 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 Children will be here daily also   Abuse/Neglect Abuse/Neglect Assessment Can Be Completed: Yes Physical Abuse: Denies Verbal Abuse: Denies Sexual Abuse: Denies Exploitation of patient/patient's resources: Denies Self-Neglect: Denies  Patient response to: Social Isolation - How often do you feel lonely or isolated from those around you?: Never  Emotional Status Pt's affect, behavior and adjustment status: Pt is motivated to improve and recover from her fall. She is still having dizziness and pain in her ribs that were fractured. She is grateful she did not hurt herself worse. She has always been independent and hopes to get back to this level Recent Psychosocial Issues: health issues but was active and doing well at home PTA Psychiatric History: No issues/history pt is coping appropriately and optimistic regarding her recovery and glad to be here on rehab Substance Abuse History: No issues  Patient / Family Perceptions, Expectations & Goals Pt/Family understanding of illness & functional limitations: Pt can explain her hemorrhage and rib fractures, she talks with the MD and feels she has a good understanding of her treatment plan going forward. She hopes to do well here on rehab. Premorbid pt/family roles/activities: Mom, gradnmother, bookkeeper, sibling, church member, etc Anticipated changes in roles/activities/participation: resume Pt/family expectations/goals: Pt states: " I hope to be moving with the walker when I leave and then gradually progress off of it and go back home."  Son states: " We will do  whatever she needs [place] to do for her."  [employer]: None Premorbid Home Care/DME Agencies: Other (Comment) (cane and rw ordered on acute in her room) Transportation available at discharge: Pt drove, will have children drive her until she is cleared to go back to driving Is the patient able to respond to transportation needs?: Yes In the past 12 months, has lack of transportation kept you from medical appointments or from getting medications?: No In the past 12 months, has lack of transportation kept you from meetings, work, or from getting things needed for daily living?: No  Discharge Planning Living Arrangements: Alone Support Systems: Children, Other relatives, Friends/neighbors, Church/faith community Type of Residence: Private residence Insurance Resources: [employer], Multimedia programmer (specify) Sports administrator) Financial Resources: Fish farm manager, Employment Financial Screen Referred: No Living Expenses: Own Money Management: Patient Does the patient have any problems obtaining your medications?: No Home Management: Patient Patient/Family Preliminary Plans: Plans to go to son's home so she will have supervision level at discharge. Her children are involved and willing to assist her at discharge.She hopes this is for a short time and then return back to her home. She realizes it will take some time for her dizziness to clear. Will await team's evaluations and work on discharge needs. Care Coordinator Anticipated Follow Up Needs: HH/OP  Clinical Impression Pleasant motivated elderly female who is very active and wants to get back to this. Her plan is to go to her son's home upon discharge where someone will be there to provide supervision, until she can return back to her home. Will await team's evaluations and work on discharge needs.  Elease Hashimoto 12/19/2020, 10:18 AM

## 2020-12-19 NOTE — Progress Notes (Signed)
Occupational Therapy Session Note  Patient Details  Name: Cheryl Stafford MRN: [id number] Date of Birth: 1929/09/22  Today's Date: 12/19/2020 OT Individual Time: 1346-1430 OT Individual Time Calculation (min): 44 min    Short Term Goals: Week 1:  OT Short Term Goal 1 (Week 1): STGs=LTGs due to ELOS  Skilled Therapeutic Interventions/Progress Updates:  Pt greeted seated in w/c reporting urgent need to void bowels. Pt completed ambulatory transfer from w/c to bathroom with rw and MIN A. Pt needed MOD A for 3/3 toileting tasks able to complete anterior pericare via lateral leans but needed assist to pull pants up to waist line, -BM, + bladder void. Pt exited bathroom with Rw and MIN A but reports dizziness, BP checked from sitting in w/c 117/79 HR 58. Pt reports her biggest goal is to be able to stand to put her make up on. Worked on dynamic standing tasks at sink such as washing her face, pt completed task with rw and CGA needing no UE support during task. Worked on functional ambulation within pts room with MIN A.  Pt completed sit>supine with CGA, gaze stabilization strategies provided throughout mobility d/t reports of dizziness. Pt able to position self up in bed with supervision. Pt left supine in bed with bed alarm activated and all needs within reach.   Therapy Documentation Precautions:  Precautions Precautions: Fall Precaution Comments: History of falls, L rib pain, dizziness Restrictions Weight Bearing Restrictions: No  Pain: Unrated pain in L side, rest breaks and repositioning provided as needed.      Therapy/Group: Individual Therapy  Precious Haws 12/19/2020, 3:42 PM

## 2020-12-19 NOTE — Discharge Instructions (Addendum)
Inpatient Rehab Discharge Instructions  Cheryl Stafford Discharge date and time: No discharge date for patient encounter.   Activities/Precautions/ Functional Status: Activity: activity as tolerated Diet: diabetic diet Wound Care: Routine skin checks Functional status:  ___ No restrictions     ___ Walk up steps independently ___ 24/7 supervision/assistance   ___ Walk up steps with assistance ___ Intermittent supervision/assistance  ___ Bathe/dress independently ___ Walk with walker     _x__ Bathe/dress with assistance ___ Walk Independently    ___ Shower independently ___ Walk with assistance    ___ Shower with assistance ___ No alcohol     ___ Return to work/school ________  Special Instructions: No driving smoking or alcohol   COMMUNITY REFERRALS UPON DISCHARGE:    Home Health:   PT  [place] Phone: [phone]    Medical Equipment/Items Ordered:TUB BENCH                                                 Agency/Supplier:[facility] [employer]  [phone]    My questions have been answered and I understand these instructions. I will adhere to these goals and the provided educational materials after my discharge from the hospital.  Patient/Caregiver Signature _______________________________ Date __________  Clinician Signature _______________________________________ Date __________  Please bring this form and your medication list with you to all your follow-up doctor's appointments.

## 2020-12-20 DIAGNOSIS — S066X1D Traumatic subarachnoid hemorrhage with loss of consciousness of 30 minutes or less, subsequent encounter: Secondary | ICD-10-CM | POA: Diagnosis not present

## 2020-12-20 MED ORDER — TOPIRAMATE 25 MG PO TABS
25.0000 mg | ORAL_TABLET | Freq: Two times a day (BID) | ORAL | Status: DC
  Administered 2020-12-20 – 2020-12-21 (×2): 25 mg via ORAL
  Filled 2020-12-20 (×2): qty 1

## 2020-12-20 MED ORDER — PROPRANOLOL HCL 10 MG PO TABS: 10.0000 mg | ORAL_TABLET | Freq: Every day | ORAL | Status: DC

## 2020-12-20 MED ORDER — MECLIZINE HCL 25 MG PO TABS
25.0000 mg | ORAL_TABLET | Freq: Three times a day (TID) | ORAL | Status: DC
  Administered 2020-12-20 – 2020-12-28 (×24): 25 mg via ORAL
  Filled 2020-12-20 (×23): qty 1

## 2020-12-20 MED ORDER — TOPIRAMATE 25 MG PO TABS
25.0000 mg | ORAL_TABLET | Freq: Once | ORAL | Status: AC
  Administered 2020-12-20: 25 mg via ORAL
  Filled 2020-12-20: qty 1

## 2020-12-20 NOTE — Progress Notes (Signed)
Physical Therapy Session Note  Patient Details  Name: Cheryl Stafford MRN: [id number] Date of Birth: 01-Sep-1929  Today's Date: 12/20/2020 PT Missed Time: 75 Minutes Missed Time Reason: Patient unwilling to participate;Patient fatigue;Pain  Short Term Goals: Week 1:  PT Short Term Goal 1 (Week 1): STG = LTG due to ELOS  Skilled Therapeutic Interventions/Progress Updates:    Patient declining PT at this time due to headache and not feeling well since vestibular eval earlier in day. Remaining in bed, bed alarm on, call light within reach.   Therapy Documentation Precautions:  Precautions Precautions: Fall Precaution Comments: History of falls, L rib pain, dizziness Restrictions Weight Bearing Restrictions: No RUE Weight Bearing: Weight bearing as tolerated LUE Weight Bearing: Weight bearing as tolerated RLE Weight Bearing: Weight bearing as tolerated LLE Weight Bearing: Weight bearing as tolerated    Therapy/Group: Individual Therapy  Karoline Caldwell, PT, DPT, CBIS  12/20/2020, 7:37 AM

## 2020-12-20 NOTE — Progress Notes (Signed)
Occupational Therapy Session Note  Patient Details  Name: Cheryl Stafford MRN: [id number] Date of Birth: 11-04-1929  Today's Date: 12/20/2020 OT Individual Time: 1307-1406 OT Individual Time Calculation (min): 59 min    Short Term Goals: Week 1:  OT Short Term Goal 1 (Week 1): STGs=LTGs due to ELOS  Skilled Therapeutic Interventions/Progress Updates:    Pt in recliner to start reporting increased headache and not feeling well.  She was agreeable to participate in vestibular testing however as pain meds were given earlier.  She was able to complete transfer to the bed with mod assist using the RW from the recliner.  Increased difficulty with sit to stand and report of increased back/rib pain.  Once on the EOB she transitioned to supine with mod assist.  Noted increased dizziness noted with transition and upward beating nystagmus noted lasting less than 10 seconds indicative of possible BPPV.  Next testing was completed for the horizontal canal with 30 degrees of neck flexion in supine with rotation to each side.  No reproduction of symptoms noted.  Transitioned to completion of anterior and posterior canal testing with Dix Hallpike maneuver.  Completed X2 on the right with no sign of nystagmus or dizziness.  With completion of the left side upward nystagmus with slight rotational component was noted for approximately 15 seconds, indicative of posterior canal BPPV.  Completed Epley maneuver X 2 within limits of pt's tolerance and at a speed that was tolerable based on pain, age, and orthopedic limitations.  Attempts were not successful this session with pt returning to supine and demonstrating nystagmus, with more of a horizontal component after second attempt.  Retested horizontal canal with some slight nystagmus noted with head turn to the left but lasting only a few beats. At this time, recommend continued repositioning maneuvers for the left posterior canal BPPV until this can be resolved, and then  re-examining the horizontal canals to see if there is also an issue there or the more horizontal nystagmus when she transitioned back to supine was related to the left posterior canal dysfunction.  She was left in bed at the end of the session with the call button and phone in reach and safety alarm in place.  Recreation Therapy in the room as well to work with pt.    Therapy Documentation Precautions:  Precautions Precautions: Fall Precaution Comments: History of falls, L rib pain, dizziness Restrictions Weight Bearing Restrictions: No RUE Weight Bearing: Weight bearing as tolerated LUE Weight Bearing: Weight bearing as tolerated RLE Weight Bearing: Weight bearing as tolerated LLE Weight Bearing: Weight bearing as tolerated  Pain:  See Pain Flowsheet for details  ADL: See Care Tool Section for some details of mobility and selfcare   Therapy/Group: Individual Therapy  Gianno Volner OTR/L 12/20/2020, 5:16 PM

## 2020-12-20 NOTE — Progress Notes (Signed)
PROGRESS NOTE   Subjective/Complaints: C/o rib pain, low back pain (chronic), headache, and dizziness- they had improved but returned again this morning. Discussed that she would be seen by Jeneen Rinks OT today for vestibular eval, encouraged kpad which she does find helpful, will increase meclizine further  ROS: +vertigo  Objective:   CT CHEST WO CONTRAST  Result Date: 12/18/2020 CLINICAL DATA:  Fall, persistent chest pain EXAM: CT CHEST WITHOUT CONTRAST TECHNIQUE: Multidetector CT imaging of the chest was performed following the standard protocol without IV contrast. COMPARISON:  None. FINDINGS: Cardiovascular: Aortic atherosclerosis. Normal heart size. Three-vessel coronary artery calcifications. No pericardial effusion. Mediastinum/Nodes: No enlarged mediastinal, hilar, or axillary lymph nodes. Large hiatal hernia with intrathoracic position of the gastric body and fundus. Thyroid gland, trachea, and esophagus demonstrate no significant findings. Lungs/Pleura: Lungs are clear.  Small left pleural effusion. Upper Abdomen: No acute abnormality. Small gallstone in the dependent gallbladder. Musculoskeletal: No chest wall mass or suspicious bone lesions identified. Minimally displaced fractures of the posterior left eighth, ninth, and tenth ribs. IMPRESSION: 1. Minimally displaced fractures of the posterior left eighth, ninth, and tenth ribs. 2. Small associated left pleural effusion. 3. Large hiatal hernia. 4. Cholelithiasis. 5. Coronary artery disease. Aortic Atherosclerosis (ICD10-I70.0). Electronically Signed   By: Delanna Ahmadi M.D.   On: 12/18/2020 14:24   Recent Labs    12/19/20 0535  WBC 6.8  HGB 11.1*  HCT 34.2*  PLT 272   Recent Labs    12/19/20 0535  NA 134*  K 4.2  CL 97*  CO2 31  GLUCOSE 102*  BUN 12  CREATININE 0.75  CALCIUM 9.3    Intake/Output Summary (Last 24 hours) at 12/20/2020 1247 Last data filed at 12/20/2020  0700 Gross per 24 hour  Intake 360 ml  Output --  Net 360 ml        Physical Exam: Vital Signs Blood pressure (!) 113/54, pulse (!) 59, temperature 98.3 F (36.8 C), temperature source Oral, resp. rate 16, height 5\' 3"  (1.6 m), weight 57.1 kg, SpO2 94 %. Gen: no distress, normal appearing HEENT: oral mucosa pink and moist, NCAT Cardio: Bradycardia Pulmonary:     Comments: Hard ot get deep breath, however CTA B/L- no W/R/R Abdominal:     Comments: Soft, NT; somewhat distended vs protuberant; hypoactive BS  Musculoskeletal:     Cervical back: Normal range of motion. No rigidity.     Comments: UE and LE strength 5-/5; tested Deltoid, biceps, triceps, WE< ,grip and finger abdction; as well as HF, KE, KF DF and PF  Skin:    Comments: No bruising on anterior L chest wall; skin OK; dry.warm  Neurological:     Comments: Patient is alert pleasant and cooperative.  Follows simple commands.  Makes eye contact with examiner.  Provides name and age.  She was able to recall events of her fall. Nystagmus to right side only Intact to light touch in all 4 extremities B/L   Psychiatric:        Mood and Affect: Mood normal.        Behavior: Behavior normal.    Assessment/Plan: 1. Functional deficits which require 3+ hours per day  of interdisciplinary therapy in a comprehensive inpatient rehab setting. Physiatrist is providing close team supervision and 24 hour management of active medical problems listed below. Physiatrist and rehab team continue to assess barriers to discharge/monitor patient progress toward functional and medical goals  Care Tool:  Bathing    Body parts bathed by patient: Face         Bathing assist Assist Level: Contact Guard/Touching assist (standing at sink to wash face)     Upper Body Dressing/Undressing Upper body dressing        Upper body assist Assist Level: Moderate Assistance - Patient 50 - 74%    Lower Body Dressing/Undressing Lower body  dressing      What is the patient wearing?: Pants, Underwear/pull up     Lower body assist Assist for lower body dressing: Maximal Assistance - Patient 25 - 49%     Toileting Toileting    Toileting assist Assist for toileting: Moderate Assistance - Patient 50 - 74%     Transfers Chair/bed transfer  Transfers assist     Chair/bed transfer assist level: Minimal Assistance - Patient > 75%     Locomotion Ambulation   Ambulation assist      Assist level: Minimal Assistance - Patient > 75% Assistive device: Walker-rolling Max distance: 39ft   Walk 10 feet activity   Assist     Assist level: Minimal Assistance - Patient > 75% Assistive device: Walker-rolling   Walk 50 feet activity   Assist Walk 50 feet with 2 turns activity did not occur: Safety/medical concerns (rib pain and dizziness)         Walk 150 feet activity   Assist Walk 150 feet activity did not occur: Safety/medical concerns         Walk 10 feet on uneven surface  activity   Assist Walk 10 feet on uneven surfaces activity did not occur: Safety/medical concerns         Wheelchair     Assist Is the patient using a wheelchair?: Yes Type of Wheelchair: Manual    Wheelchair assist level: Dependent - Patient 0% Max wheelchair distance: 9ft    Wheelchair 50 feet with 2 turns activity    Assist    Wheelchair 50 feet with 2 turns activity did not occur: Safety/medical concerns       Wheelchair 150 feet activity     Assist  Wheelchair 150 feet activity did not occur: Safety/medical concerns       Blood pressure (!) 113/54, pulse (!) 59, temperature 98.3 F (36.8 C), temperature source Oral, resp. rate 16, height 5\' 3"  (1.6 m), weight 57.1 kg, SpO2 94 %.  Medical Problem List and Plan: 1. Functional deficits with decreased in gait and dizziness secondary to traumatic SAH             -patient may  shower             -ELOS/Goals: 7-10 days supervision to min  A  Continue CIR 2.  Antithrombotics: -DVT/anticoagulation:  Mechanical: Antiembolism stockings, thigh (TED hose) Bilateral lower extremities             -antiplatelet therapy: N/A 3. Pain Management: Neurontin 300 mg nightly, Lidoderm patch as directed, tramadol as needed 4. Mood: Lexapro 20 mg daily             -antipsychotic agents: N/A 5. Neuropsych: This patient is capable of making decisions on her own behalf. 6. Skin/Wound Care: Routine skin checks 7. Fluids/Electrolytes/Nutrition: Routine in and  outs with follow-up chemistries 8.  Hypothyroidism.  Synthroid 9.  Hypertension.  Inderal 10 mg twice daily 10.  Restless leg syndrome.  Continue Requip/Mirapex 0.25 mg nightly 11. Constipation- LBM 1 week ago- had miralax x2 days- if no BM, needs Sorbitol. 12. Vertigo: increase meclizine to 25mg  TID. Vestibular eval and treatment.  13. Headache: increase Topamax to 25mg  BID. 14. Decreased appetite: discussed that this is normal post- SAH. May be related to constipation as well.  15. Bradycardia: Decrease inderal to 10mg  HS    LOS: 2 days A FACE TO FACE EVALUATION WAS PERFORMED  Clide Deutscher Esau Fridman 12/20/2020, 12:47 PM

## 2020-12-20 NOTE — Progress Notes (Signed)
Occupational Therapy Session Note  Patient Details  Name: Cheryl Stafford MRN: [id number] Date of Birth: [birth year]  Today's Date: 12/20/2020 OT Individual Time: 1307-1406 OT Individual Time Calculation (min): 59 min    Short Term Goals: Week 1:  OT Short Term Goal 1 (Week 1): STGs=LTGs due to ELOS  Skilled Therapeutic Interventions/Progress Updates:    Pt semi reclined in bed, c/o significant 5/10 pain in head and left ribcage.  Offered kpad and placed on pts left side to reduce pain.  After 15 minutes therapist returned and pt reports decrease in pain and agreeable to changing clothes at EOB.  Mod assist supine to sit needing increased assist due to pain and dizziness.  Instructed pt to focus on designated spot when moving and transition slowly.  Pt doffed/donned shirt with mod assist and doffed/donned pants with mod assist as well.  Sit to stand at [facility] and step pivot to recliner with min assist and increased time due to dizziness.  Pt left with BLE elevated, call bell in reach, seat alarm on.  Significantly increased amount of time to complete each task due to pain, fatigue, and dizziness.   Therapy Documentation Precautions:  Precautions Precautions: Fall Precaution Comments: History of falls, L rib pain, dizziness Restrictions Weight Bearing Restrictions: No RUE Weight Bearing: Weight bearing as tolerated LUE Weight Bearing: Weight bearing as tolerated RLE Weight Bearing: Weight bearing as tolerated LLE Weight Bearing: Weight bearing as tolerated   Therapy/Group: Individual Therapy  Ezekiel Slocumb 12/20/2020, 6:39 PM

## 2020-12-20 NOTE — Evaluation (Signed)
Recreational Therapy Assessment and Plan  Patient Details  Name: Cheryl Stafford MRN: [id number] Date of Birth: [birth year] Today's Date: 12/20/2020  Rehab Potential:  Good ELOS:   2 weeks  Assessment   Hospital Problem: Principal Problem:   Traumatic subarachnoid hemorrhage     Past Medical History:      Past Medical History:  Diagnosis Date   Esophageal dysphagia     Functional diarrhea     GERD (gastroesophageal reflux disease)     Hypertension     Hypothyroid     IDA (iron deficiency anemia)     Prediabetes 12/12/2020    Past Surgical History:       Past Surgical History:  Procedure Laterality Date   ABDOMINAL HYSTERECTOMY       COLONOSCOPY   06/07/2012    Colonic polyp status post polypectomy, Pancolonic diverticulosis redominantly in the sigmoid colon. Examination was limited due to qualitiy of preparation.   ESOPHAGOGASTRODUODENOSCOPY   07/30/2016    Highly torturous esophagus suggestive of motility disorder/presbyesophagus. Schatzki's ring status post esophageal dilatation. Large hiatal hernia. Mild gastritis.    TOTAL KNEE ARTHROPLASTY   01/02/2015      Assessment & Plan Clinical Impression: Patient is a Cheryl Stafford is a 87 year old right-handed female with history of hypertension, hypothyroidism, iron deficiency anemia, type 2 diabetes mellitus, GERD.  Per chart review patient lives alone.  1 level home 3 steps to entry.  Ambulates with a cane with reports of 4 falls in the last 6 months.  She plans to be discharged to home with her daughter.  Presented 12/11/2020 after a fall while attempting to pick up a package when she fell backwards striking her head.  There was associated headache with nausea but no vomiting.  She denied loss of consciousness.  Cranial CT scan showed minimal right temporal subarachnoid hemorrhage.  No mass-effect or midline shift.  No acute traumatic cervical spine pathology.  Admission chemistries unremarkable sodium 133 glucose 150, WBC 13,100,  troponin negative, urinalysis negative nitrite, hemoglobin A1c 5.6.  Neurosurgery Dr. Venetia Constable consulted advised conservative care with follow-up MRI 12/14/2020 showing small 3 mm subdural hematoma overlying the posterior right cerebral convexity with extension along the right tentorium no significant mass-effect.  Trace residual subarachnoid hemorrhage most pronounced at the right occipital region.  13 mm evolving nonhemorrhagic cortical contusion at the peripheral right temporal lobe.  No other acute intracranial abnormality.  Echocardiogram with ejection fraction of 60 to 65% no wall motion abnormalities grade 1 diastolic dysfunction.  Patient did have some complaints of left lateral chest wall tenderness felt to be musculoskeletal in nature x-ray rib series negative CTA chest did show a posterior rib fracture advised conservative care.  Follow-up speech therapy for any signs of dysphagia patient with longstanding history of multiple esophageal issues scheduled to have outpatient esophageal examination.  Found to have no obvious oral issues or overt aspiration maintain on a regular consistency diet. Patient transferred to CIR on 12/18/2020 .     Met with pt today to discuss leisure interests, activity analysis/modifications.  Pt c/o HA, staff aware.  Pt presents with decreased activity tolerance, decreased functional mobility, decreased balance decreased coordination Limiting pt's independence with leisure/community pursuits.    Plan  Min 1 TR session per week during LOS  Recommendations for other services: None   Discharge Criteria: Patient will be discharged from TR if patient refuses treatment 3 consecutive times without medical reason.  If treatment goals not met, if there is a change in  medical status, if patient makes no progress towards goals or if patient is discharged from hospital.  The above assessment, treatment plan, treatment alternatives and goals were discussed and mutually agreed  upon: by patient  [place] 12/20/2020, 4:21 PM

## 2020-12-21 MED ORDER — TOPIRAMATE 25 MG PO TABS
50.0000 mg | ORAL_TABLET | Freq: Two times a day (BID) | ORAL | Status: DC
  Administered 2020-12-21 – 2020-12-28 (×14): 50 mg via ORAL
  Filled 2020-12-21 (×14): qty 2

## 2020-12-21 MED ORDER — MAGNESIUM HYDROXIDE 400 MG/5ML PO SUSP
15.0000 mL | Freq: Once | ORAL | Status: AC
  Administered 2020-12-21: 15 mL via ORAL

## 2020-12-21 NOTE — Progress Notes (Signed)
Occupational Therapy Session Note  Patient Details  Name: Cheryl Stafford MRN: [id number] Date of Birth: [birth year]  Today's Date: 12/21/2020 OT Individual Time: 6010-9323 OT Individual Time Calculation (min): 61 min    Short Term Goals: Week 1:  OT Short Term Goal 1 (Week 1): STGs=LTGs due to ELOS  Skilled Therapeutic Interventions/Progress Updates:  Pt greeted  supine in bed    agreeable to OT intervention however pt reports not feeling well reporting HA and general malaise. Pt completed supine>sit with MOD A with pt exiting to r side of bed as pt reports her dizziness increases when rolling to L side. Focused on gaze stabilization techniques during supine>sidelying>sit with MOD A needed overall. Pt completed sit>stand with MIN A with RW. MIN A for stand pivot transfer from EOB>w/c. Transported pt to sink where pt completed seated grooming tasks such as hair brushing and washing her face with set- up assist. Pt transported to gym on [employer] with total A for time mgmt. Worked on dynamic standing balance with pt able to stand for 2 mins and 30 secs to complete connect 4 game with unilateal support and CGA.pt reported fatigue needing to sit down, pt reports nausea and noted to be pale. Returned pt to room with total A to assess vitals, BP 128/77. Pt returned to EOB with MIN A via stand pivot transfer with RW. CGA for sit>supine . pt left  supine in bed with bed alarm activated and all needs within reach.                      Therapy Documentation Precautions:  Precautions Precautions: Fall Precaution Comments: History of falls, L rib pain, dizziness Restrictions Weight Bearing Restrictions: No RUE Weight Bearing: Weight bearing as tolerated LUE Weight Bearing: Weight bearing as tolerated RLE Weight Bearing: Weight bearing as tolerated LLE Weight Bearing: Weight bearing as tolerated  Pain: general discomfort reported from HA, pain meds given prior to session. Provided rest breaks as needed as  well as cold wash cloth to head. Pain reported in L side, heat packed placed at end of session     Therapy/Group: Individual Therapy  Precious Haws 12/21/2020, 3:51 PM

## 2020-12-21 NOTE — Progress Notes (Signed)
Physical Therapy Session Note  Patient Details  Name: Cheryl Stafford MRN: [id number] Date of Birth: November 09, 1929  Today's Date: 12/21/2020 PT Individual Time: 1104-1200 PT Individual Time Calculation (min): 56 min   Short Term Goals: Week 1:  PT Short Term Goal 1 (Week 1): STG = LTG due to ELOS  Skilled Therapeutic Interventions/Progress Updates:    Patient received supine in bed, asleep, easy to wake and agreeable to PT. She reports L rib pain, but did not rate- premedicated. PT providing rest breaks, distractions and repositioning. She was able to don pants at bedlevel with MinA. Coming to sit edge of bed with MinA. Transfer to wc via stand pivot with RW and CGA. PT transporting patient in wc to therapy gym for time management and energy conservation. She ambulated 6ft x2 with RW and light CGA. Very slow gait speed noted. Initially low power in B LE to come to stand with slow to rise. Patient completing x8 mins on NuStep with multiple rest breaks throughout due to fatigue. She remains very limited by general deconditioning and L rib pain. Returning to room. Ambulatory transfer to bed per patient request. Bed alarm on, call light within reach.   Therapy Documentation Precautions:  Precautions Precautions: Fall Precaution Comments: History of falls, L rib pain, dizziness Restrictions Weight Bearing Restrictions: No RUE Weight Bearing: Weight bearing as tolerated LUE Weight Bearing: Weight bearing as tolerated RLE Weight Bearing: Weight bearing as tolerated LLE Weight Bearing: Weight bearing as tolerated     Therapy/Group: Individual Therapy  Karoline Caldwell, PT, DPT, CBIS  12/21/2020, 7:38 AM

## 2020-12-21 NOTE — Progress Notes (Signed)
Physical Therapy Session Note  Patient Details  Name: Cheryl Stafford MRN: [id number] Date of Birth: Feb 09, 1929  Today's Date: 12/21/2020 PT Individual Time: 1615-1725   70 min   Short Term Goals: Week 1:  PT Short Term Goal 1 (Week 1): STG = LTG due to ELOS   Skilled Therapeutic Interventions/Progress Updates:   Pt received supine in bed and agreeable to PT, but reports vertigo from returning to bed following toiletting with RN prior to session. Supine>sit transfer with mod assist on the L to prevent vertigo s/s. Stand pivot transfer to [facility] with min assist and increased time to achieve standing. Pt transported to day room. Engaged in standing balance/.tolerance while performing gross motor task of Wii bowling. Able to tolerate 4 frames in standing prior to requesting rest break due to lightheadedness. After 1 min rest s/s reduced to baseline. Gait training x 140f with  RW and min assist from PT and cues for safety in turns to reduce cervical rotation and prevent vertigo. WC mobility x 1046fwith min assist for UE endurance training. Mild discomfrot in L rib cage, but does not rate. Kinetron BLE endurance training 5 x 45sec at 30 cm/sec. Pt returned to room and performed ambulatory transfer to bed with min assist and RW. Sit>supine completed with min assist, and left supine in bed with call bell in reach and all needs met. Throughout session pt required mulitple prolonged rest breaks due to fatigue.           Therapy Documentation Precautions:  Precautions Precautions: Fall Precaution Comments: History of falls, L rib pain, dizziness Restrictions Weight Bearing Restrictions: No RUE Weight Bearing: Weight bearing as tolerated LUE Weight Bearing: Weight bearing as tolerated RLE Weight Bearing: Weight bearing as tolerated LLE Weight Bearing: Weight bearing as tolerated  Vital Signs: Therapy Vitals Temp: 98.5 F (36.9 C) Pulse Rate: 69 Resp: 16 BP: 124/66 Patient Position (if  appropriate): Sitting Oxygen Therapy SpO2: 94 % O2 Device: Room Air Pain: Denies at rest    Therapy/Group: Individual Therapy  AuLorie Phenix2/16/2022, 4:17 PM

## 2020-12-21 NOTE — Progress Notes (Signed)
PROGRESS NOTE   Subjective/Complaints: Dizziness improved but still bothersome. She noticed it is worst when she looks to the left so she is trying to avoid this. Appreciate Jeneen Rinks' eval.  Headaches still significant  ROS: +vertigo, +frontal headaches  Objective:   No results found. Recent Labs    12/19/20 0535  WBC 6.8  HGB 11.1*  HCT 34.2*  PLT 272   Recent Labs    12/19/20 0535  NA 134*  K 4.2  CL 97*  CO2 31  GLUCOSE 102*  BUN 12  CREATININE 0.75  CALCIUM 9.3    Intake/Output Summary (Last 24 hours) at 12/21/2020 1321 Last data filed at 12/21/2020 0700 Gross per 24 hour  Intake 120 ml  Output --  Net 120 ml        Physical Exam: Vital Signs Blood pressure 113/64, pulse (!) 55, temperature 97.8 F (36.6 C), resp. rate 16, height 5\' 3"  (1.6 m), weight 57.1 kg, SpO2 93 %. Gen: no distress, normal appearing, appears frail, BMI 22.30 HEENT: oral mucosa pink and moist, NCAT Cardio: Bradycardia Pulmonary:     Comments: Hard ot get deep breath, however CTA B/L- no W/R/R Abdominal:     Comments: Soft, NT; somewhat distended vs protuberant; hypoactive BS  Musculoskeletal:     Cervical back: Normal range of motion. No rigidity.     Comments: UE and LE strength 5-/5; tested Deltoid, biceps, triceps, WE< ,grip and finger abdction; as well as HF, KE, KF DF and PF  Skin:    Comments: No bruising on anterior L chest wall; skin OK; dry.warm  Neurological:     Comments: Patient is alert pleasant and cooperative.  Follows simple commands.  Makes eye contact with examiner.  Provides name and age.  She was able to recall events of her fall. Nystagmus to right side only Intact to light touch in all 4 extremities B/L   Psychiatric:        Mood and Affect: Mood normal.        Behavior: Behavior normal.    Assessment/Plan: 1. Functional deficits which require 3+ hours per day of interdisciplinary therapy in a  comprehensive inpatient rehab setting. Physiatrist is providing close team supervision and 24 hour management of active medical problems listed below. Physiatrist and rehab team continue to assess barriers to discharge/monitor patient progress toward functional and medical goals  Care Tool:  Bathing    Body parts bathed by patient: Face         Bathing assist Assist Level: Contact Guard/Touching assist (standing at sink to wash face)     Upper Body Dressing/Undressing Upper body dressing        Upper body assist Assist Level: Moderate Assistance - Patient 50 - 74%    Lower Body Dressing/Undressing Lower body dressing      What is the patient wearing?: Pants, Underwear/pull up     Lower body assist Assist for lower body dressing: Maximal Assistance - Patient 25 - 49%     Toileting Toileting    Toileting assist Assist for toileting: Moderate Assistance - Patient 50 - 74%     Transfers Chair/bed transfer  Transfers assist  Chair/bed transfer assist level: Minimal Assistance - Patient > 75%     Locomotion Ambulation   Ambulation assist      Assist level: Minimal Assistance - Patient > 75% Assistive device: Walker-rolling Max distance: 68ft   Walk 10 feet activity   Assist     Assist level: Minimal Assistance - Patient > 75% Assistive device: Walker-rolling   Walk 50 feet activity   Assist Walk 50 feet with 2 turns activity did not occur: Safety/medical concerns (rib pain and dizziness)         Walk 150 feet activity   Assist Walk 150 feet activity did not occur: Safety/medical concerns         Walk 10 feet on uneven surface  activity   Assist Walk 10 feet on uneven surfaces activity did not occur: Safety/medical concerns         Wheelchair     Assist Is the patient using a wheelchair?: Yes Type of Wheelchair: Manual    Wheelchair assist level: Dependent - Patient 0% Max wheelchair distance: 39ft    Wheelchair  50 feet with 2 turns activity    Assist    Wheelchair 50 feet with 2 turns activity did not occur: Safety/medical concerns       Wheelchair 150 feet activity     Assist  Wheelchair 150 feet activity did not occur: Safety/medical concerns       Blood pressure 113/64, pulse (!) 55, temperature 97.8 F (36.6 C), resp. rate 16, height 5\' 3"  (1.6 m), weight 57.1 kg, SpO2 93 %.  Medical Problem List and Plan: 1. Functional deficits with decreased in gait and dizziness secondary to traumatic SAH             -patient may  shower             -ELOS/Goals: 7-10 days supervision to min A  Continue CIR 2.  Antithrombotics: -DVT/anticoagulation:  Mechanical: Antiembolism stockings, thigh (TED hose) Bilateral lower extremities             -antiplatelet therapy: N/A 3. Pain Management: Neurontin 300 mg nightly, Lidoderm patch as directed, tramadol as needed 4. Mood: Lexapro 20 mg daily             -antipsychotic agents: N/A 5. Neuropsych: This patient is capable of making decisions on her own behalf. 6. Skin/Wound Care: Routine skin checks 7. Fluids/Electrolytes/Nutrition: Routine in and outs with follow-up chemistries 8.  Hypothyroidism.  Synthroid 9.  Hypertension.  Inderal 10 mg twice daily 10.  Restless leg syndrome.  Continue Requip/Mirapex 0.25 mg nightly 11. Constipation- LBM 1 week ago- had miralax x2 days- if no BM, needs Sorbitol. 12. Vertigo: increase meclizine to 25mg  TID. Vestibular eval and treatment. Avoid looking to the left 13. Headache: increase Topamax to 50 mg BID. 14. Decreased appetite: discussed that this is normal post- SAH. May be related to constipation as well.  15. Bradycardia: d/c inderal    LOS: 3 days A FACE TO FACE EVALUATION WAS PERFORMED  Lanita Stammen P Markayla Reichart 12/21/2020, 1:21 PM

## 2020-12-21 NOTE — IPOC Note (Signed)
Overall Plan of Care [facility]) Patient Details Name: Cheryl Stafford MRN: [id number] DOB: [birth year]  Admitting Diagnosis: Traumatic subarachnoid hemorrhage  Hospital Problems: Principal Problem:   Traumatic subarachnoid hemorrhage     Functional Problem List: Nursing Endurance, Medication Management, Safety, Pain, Bladder, Bowel  PT Balance, Endurance, Pain, Safety, Motor  OT Balance, Endurance, Pain, Skin Integrity, Motor  SLP    TR         Basic ADLs: OT Bathing, Dressing, Toileting     Advanced  ADLs: OT       Transfers: PT Bed Mobility, Bed to Chair, Car  OT Toilet, Tub/Shower     Locomotion: PT Ambulation, Stairs     Additional Impairments: OT    SLP        TR      Anticipated Outcomes Item Anticipated Outcome  Self Feeding    Swallowing      Basic self-care  setup-supervision  Toileting  setup   Bathroom Transfers supervision  Bowel/Bladder  Manage bowel with mod I and bladder without assist  Transfers  supervision with LRAD  Locomotion  supervision with LRAD  Communication     Cognition     Pain  pain at or below level 4 with prn medications  Safety/Judgment  maintain safety with cues/reminders   Therapy Plan: PT Intensity: Minimum of 1-2 x/day ,45 to 90 minutes PT Frequency: 5 out of 7 days PT Duration Estimated Length of Stay: 7-10 days OT Intensity: Minimum of 1-2 x/day, 45 to 90 minutes OT Frequency: 5 out of 7 days OT Duration/Estimated Length of Stay: 7-10 days     Due to the current state of emergency, patients may not be receiving their 3-hours of Medicare-mandated therapy.   Team Interventions: Nursing Interventions Bladder Management, Disease Management/Prevention, Medication Management, Discharge Planning, Pain Management, Bowel Management, Patient/Family Education  PT interventions Ambulation/gait training, Training and development officer, Cognitive remediation/compensation, Community reintegration, Discharge planning,  Disease management/prevention, DME/adaptive equipment instruction, Functional electrical stimulation, Functional mobility training, Neuromuscular re-education, Pain management, Patient/family education, Psychosocial support, Skin care/wound management, Splinting/orthotics, Stair training, Therapeutic Activities, Therapeutic Exercise, UE/LE Strength taining/ROM, UE/LE Coordination activities, Visual/perceptual remediation/compensation, Wheelchair propulsion/positioning  OT Interventions Training and development officer, Disease mangement/prevention, Neuromuscular re-education, Patient/family education, Self Care/advanced ADL retraining, Therapeutic Exercise, UE/LE Coordination activities, Functional mobility training, Discharge planning, DME/adaptive equipment instruction, Pain management, Psychosocial support, Therapeutic Activities, UE/LE Strength taining/ROM  SLP Interventions    TR Interventions    SW/CM Interventions Discharge Planning, Psychosocial Support, Patient/Family Education   Barriers to Discharge MD  Medical stability  Nursing Decreased caregiver support 1 level, level entry w daughter  PT Incontinence, Insurance for SNF coverage, Other (comments) pain, dizziness  OT      SLP      SW       Team Discharge Planning: Destination: PT-Home ,OT- Home , SLP-  Projected Follow-up: PT-Home health PT, 24 hour supervision/assistance, OT-  Home health OT, SLP-  Projected Equipment Needs: PT-To be determined, OT-  , SLP-  Equipment Details: PT- , OT-Pt has a shower chair Patient/family involved in discharge planning: PT- Patient,  OT-Patient, SLP-   MD ELOS: 10 days Medical Rehab Prognosis:  Excellent Assessment: Cheryl Stafford is an 87 year old woman admitted to CIR with functional deficits with decreased in gait and dizziness secondary to traumatic SAH. Medications are being managed, and labs and vitals are being monitored regularly.      See Team Conference Notes for weekly updates to the  plan of care

## 2020-12-22 DIAGNOSIS — S066X0S Traumatic subarachnoid hemorrhage without loss of consciousness, sequela: Secondary | ICD-10-CM

## 2020-12-22 NOTE — Progress Notes (Signed)
Physical Therapy Session Note  Patient Details  Name: Cheryl Stafford MRN: [id number] Date of Birth: 11/01/1929  Today's Date: 12/22/2020 PT Individual Time: 1002-1100 PT Individual Time Calculation (min): 58 min   Short Term Goals: Week 1:  PT Short Term Goal 1 (Week 1): STG = LTG due to ELOS   Skilled Therapeutic Interventions/Progress Updates:   Pt received supine in bed and agreeable to PT. Supine>sit transfer with supervision assist and cues for safety to prevent pain in L side rib cage. Ambulatory transfer to [facility] with RW and CGA. Pt transported to day room in [facility]. Dynamic balance training with UE support ed RW to toss bean bag to target standing on airex pad x 4 BUE and from level surface x4 BUE. Cues for ankle strategy to prevent posterior bias initially. Gait training with RW x 149f with CGA-supervision assist with RW and dynamic gait training with to wave through 6 cones x 2 with Min assist and cues for AD management. Nustep reciprocal movement training x 8 min with cues for full ROM and cues for use of BUE as tolerated. Pt returned to room and performed stand pviot transfer to bed with RW and supervision assist. Sit>supine completed with supervision assist from L side of bed and left supine in bed with call bell in reach and all needs met.        Therapy Documentation Precautions:  Precautions Precautions: Fall Precaution Comments: History of falls, L rib pain, dizziness Restrictions Weight Bearing Restrictions: No RUE Weight Bearing: Weight bearing as tolerated LUE Weight Bearing: Weight bearing as tolerated RLE Weight Bearing: Weight bearing as tolerated LLE Weight Bearing: Weight bearing as tolerated    Vital Signs: Therapy Vitals Temp: 98.5 F (36.9 C) Temp Source: Oral Pulse Rate: 72 Resp: 20 BP: 124/65 Patient Position (if appropriate): Lying Oxygen Therapy SpO2: 94 % O2 Device: Room Air Pain: denies    Therapy/Group: Individual Therapy  ALorie Phenix12/17/2022, 5:40 PM

## 2020-12-22 NOTE — Progress Notes (Signed)
PROGRESS NOTE   Subjective/Complaints:  No issues overnite , poor appetite this am , had good BM yesterday and today   ROS: neg CP, SOB, N/V/D  Objective:   No results found. No results for input(s): WBC, HGB, HCT, PLT in the last 72 hours.  No results for input(s): NA, K, CL, CO2, GLUCOSE, BUN, CREATININE, CALCIUM in the last 72 hours.   Intake/Output Summary (Last 24 hours) at 12/22/2020 0944 Last data filed at 12/22/2020 0916 Gross per 24 hour  Intake 40 ml  Output --  Net 40 ml         Physical Exam: Vital Signs Blood pressure 123/65, pulse 70, temperature 98.3 F (36.8 C), temperature source Oral, resp. rate 16, height 5\' 3"  (1.6 m), weight 57.1 kg, SpO2 92 %.  General: No acute distress Mood and affect are appropriate Heart: Regular rate and rhythm no rubs murmurs or extra sounds Lungs: Clear to auscultation, breathing unlabored, no rales or wheezes Abdomen: Positive bowel sounds, soft nontender to palpation, nondistended Extremities: No clubbing, cyanosis, or edema Skin: No evidence of breakdown, no evidence of rash   Musculoskeletal:     Cervical back: Normal range of motion. No rigidity.     Comments: UE and LE strength 5-/5; tested Deltoid, biceps, triceps, WE< ,grip and finger abdction; as well as HF, KE, KF DF and PF  Skin:    Comments: No bruising on anterior L chest wall; skin OK; dry.warm  Neurological:     Comments: Patient is alert pleasant and cooperative.  Follows simple commands.  Makes eye contact with examiner.  Provides name and age.  She was able to recall events of her fall. Nystagmus to right side only Intact to light touch in all 4 extremities B/L   Psychiatric:        Mood and Affect: Mood normal.        Behavior: Behavior normal.    Assessment/Plan: 1. Functional deficits which require 3+ hours per day of interdisciplinary therapy in a comprehensive inpatient rehab  setting. Physiatrist is providing close team supervision and 24 hour management of active medical problems listed below. Physiatrist and rehab team continue to assess barriers to discharge/monitor patient progress toward functional and medical goals  Care Tool:  Bathing    Body parts bathed by patient: Face         Bathing assist Assist Level: Set up assist (sitting in w/c)     Upper Body Dressing/Undressing Upper body dressing        Upper body assist Assist Level: Moderate Assistance - Patient 50 - 74%    Lower Body Dressing/Undressing Lower body dressing      What is the patient wearing?: Pants, Underwear/pull up     Lower body assist Assist for lower body dressing: Maximal Assistance - Patient 25 - 49%     Toileting Toileting    Toileting assist Assist for toileting: Moderate Assistance - Patient 50 - 74%     Transfers Chair/bed transfer  Transfers assist     Chair/bed transfer assist level: Minimal Assistance - Patient > 75%     Locomotion Ambulation   Ambulation assist  Assist level: Minimal Assistance - Patient > 75% Assistive device: Walker-rolling Max distance: 71ft   Walk 10 feet activity   Assist     Assist level: Minimal Assistance - Patient > 75% Assistive device: Walker-rolling   Walk 50 feet activity   Assist Walk 50 feet with 2 turns activity did not occur: Safety/medical concerns (rib pain and dizziness)         Walk 150 feet activity   Assist Walk 150 feet activity did not occur: Safety/medical concerns         Walk 10 feet on uneven surface  activity   Assist Walk 10 feet on uneven surfaces activity did not occur: Safety/medical concerns         Wheelchair     Assist Is the patient using a wheelchair?: Yes Type of Wheelchair: Manual    Wheelchair assist level: Dependent - Patient 0% Max wheelchair distance: 63ft    Wheelchair 50 feet with 2 turns activity    Assist         Assist Level: Dependent - Patient 0% (per PT documentation)   Wheelchair 150 feet activity     Assist      Assist Level: Dependent - Patient 0% (per PT documentation)   Blood pressure 123/65, pulse 70, temperature 98.3 F (36.8 C), temperature source Oral, resp. rate 16, height 5\' 3"  (1.6 m), weight 57.1 kg, SpO2 92 %.  Medical Problem List and Plan: 1. Functional deficits with decreased in gait and dizziness secondary to traumatic SAH             -patient may  shower             -ELOS/Goals: 7-10 days supervision to min A  Continue CIR 2.  Antithrombotics: -DVT/anticoagulation:  Mechanical: Antiembolism stockings, thigh (TED hose) Bilateral lower extremities             -antiplatelet therapy: N/A 3. Pain Management: Neurontin 300 mg nightly, Lidoderm patch as directed, tramadol as needed 4. Mood: Lexapro 20 mg daily             -antipsychotic agents: N/A 5. Neuropsych: This patient is capable of making decisions on her own behalf. 6. Skin/Wound Care: Routine skin checks 7. Fluids/Electrolytes/Nutrition: Routine in and outs with follow-up chemistries 8.  Hypothyroidism.  Synthroid 9.  Hypertension.  Inderal 10 mg twice daily Vitals:   12/21/20 1952 12/22/20 0517  BP: 122/68 123/65  Pulse: 74 70  Resp: 18 16  Temp: 97.9 F (36.6 C) 98.3 F (36.8 C)  SpO2: 95% 92%  Controlled 12/17  10.  Restless leg syndrome.  Continue Requip/Mirapex 0.25 mg nightly 11. Constipation- LBM 1 week ago- had miralax x2 days- if no BM, needs Sorbitol. 12. Vertigo: increase meclizine to 25mg  TID. Vestibular eval and treatment. Avoid looking to the left 13. Headache: increase Topamax to 50 mg BID. 14. Decreased appetite: discussed that this is normal post- SAH. May be related to constipation as well.  15. Bradycardia: d/c inderal    LOS: 4 days A FACE TO FACE EVALUATION WAS PERFORMED  Charlett Blake 12/22/2020, 9:44 AM

## 2020-12-22 NOTE — Progress Notes (Signed)
Physical Therapy Session Note  Patient Details  Name: Cheryl Stafford MRN: [id number] Date of Birth: [birth year]  Today's Date: 12/22/2020 PT Individual Time: 0800-0910 PT Individual Time Calculation (min): 70 min   Short Term Goals: Week 1:  PT Short Term Goal 1 (Week 1): STG = LTG due to ELOS  Skilled Therapeutic Interventions/Progress Updates:    Chart reviewed and pt agreeable to therapy. Pt received supine in bed with no c/o pain. Session plan to focus on progression of functional transfers, ambulation, and activity tolerance. Pt initiated session with transfer to EOB, sit to stand, and 15 ft amb to toilet using CGA +RW and VC for safe use of RW. Pt then completed transfer to chair with CGA +RW. Pt required modI with donning pants from chair. PT then intended to take pt to therapy gym for continued exercise. However, pt reported high dizziness and headache. BP checked with reading of 122/83(94) mmHg. Pt took a short 3 min rest and then reported decrease in symptoms and willingness to continue session. Pt transferred to hallway and completed 3x amb with distance of 15ft + 98ft + 135ft and 3 min rest breaks in between each trial. For amb, pt assist progressed from CGA to supervision. Pt noted to have very slow gate speed. During amb, PT emphasized increasing step length and speed to enhance gait training. Pt then returned to room and practiced 3x sit to stand with CGA and no RW. PT emphasized standing technique for conservation of energy and balance safety. Session education emphasized safe use of RW. Pt was updated on and agreed to continued care plan.At end of session pt was left seated in recliner with alarm engaged, nurse call bell and all needs in reach.   Therapy Documentation Precautions:  Precautions Precautions: Fall Precaution Comments: History of falls, L rib pain, dizziness Restrictions Weight Bearing Restrictions: No RUE Weight Bearing: Weight bearing as tolerated LUE Weight  Bearing: Weight bearing as tolerated RLE Weight Bearing: Weight bearing as tolerated LLE Weight Bearing: Weight bearing as tolerated  Pain: Pain Assessment Pain Scale: 0-10 Pain Score: 0-No pain   Therapy/Group: Individual Therapy  Marquette Saa, PT, DPT 12/22/2020, 1:19 PM

## 2020-12-22 NOTE — Progress Notes (Signed)
Occupational Therapy Session Note  Patient Details  Name: Cheryl Stafford MRN: [id number] Date of Birth: 1929/03/04  Today's Date: 12/22/2020 OT Individual Time: 1300-1356 OT Individual Time Calculation (min): 56 min    Short Term Goals: Week 1:  OT Short Term Goal 1 (Week 1): STGs=LTGs due to ELOS  Skilled Therapeutic Interventions/Progress Updates:  Pt greeted supine in bed  agreeable to OT intervention. Session focus on BADL reeducation and therapeutic activities focused on standing balance and overall standing tolerance for ADL participation ( as pts main goal is to be able to stand up and put on her make up for 10-15 mins). Pt completed sit>supine with CGA exiting to L side of bed. CGA for ambulatory toilet transfer with RW, +incontinent BM, MIN A for 3/3 toileting tasks needing assist for clothing mgmt to manage clothes on L side d/t L rib pain. Pt ambulated back to bed with CGA, pt completed dressing from EOB with MIN A for UB dressing and MAX A to don pants as pants were tight leggings. Total A to don socks. Pt required supine rest break after ADLs. Pt able to transfer supine>sit with CGA, CGA for stand pivot transfer from EOB>w/c with RW. Pt transported to [place] with total A for time mgmt. Pt completed standing therapeutic activity focused on dynamic standing balance with pt using BUEs to create PVC pipe structure from visual aid provided. Pt able to stand from w/c with CGA, pt stood for 7 mins and 22 secs before needing to sit. Once pt sat, pt does endorse dizziness but feeling passes after ~ 2 mins. Pt transported back to room with total A for time mgmt, stand pivot transfer back to bed with Rw and CGA. Sit>supine with CGA.  pt left supine in bed with bed alarm activated and all needs within reach.                      Therapy Documentation Precautions:  Precautions Precautions: Fall Precaution Comments: History of falls, L rib pain, dizziness Restrictions Weight Bearing  Restrictions: No RUE Weight Bearing: Weight bearing as tolerated LUE Weight Bearing: Weight bearing as tolerated RLE Weight Bearing: Weight bearing as tolerated LLE Weight Bearing: Weight bearing as tolerated  Pain: unrated pain in L side reported, rest breaks and K pad utilized as pain mgmt strategy.    Therapy/Group: Individual Therapy  Precious Haws 12/22/2020, 1:59 PM

## 2020-12-23 MED ORDER — LORAZEPAM 0.5 MG PO TABS
0.2500 mg | ORAL_TABLET | Freq: Four times a day (QID) | ORAL | Status: DC | PRN
  Administered 2020-12-23 – 2020-12-25 (×3): 0.25 mg via ORAL
  Filled 2020-12-23 (×3): qty 1

## 2020-12-24 LAB — BASIC METABOLIC PANEL
Anion gap: 6 (ref 5–15)
BUN: 23 mg/dL (ref 8–23)
CO2: 23 mmol/L (ref 22–32)
Calcium: 9.3 mg/dL (ref 8.9–10.3)
Chloride: 104 mmol/L (ref 98–111)
Creatinine, Ser: 0.9 mg/dL (ref 0.44–1.00)
GFR, Estimated: >60 mL/min (ref >60)
Glucose, Bld: 108 mg/dL — ABNORMAL HIGH (ref 70–99)
Potassium: 3.8 mmol/L (ref 3.5–5.1)
Sodium: 133 mmol/L — ABNORMAL LOW (ref 135–145)

## 2020-12-24 LAB — CBC
HCT: 38.2 % (ref 36.0–46.0)
Hemoglobin: 12.6 g/dL (ref 12.0–15.0)
MCH: 30.7 pg (ref 26.0–34.0)
MCHC: 33 g/dL (ref 30.0–36.0)
MCV: 93.2 fL (ref 80.0–100.0)
Platelets: 359 10*3/uL (ref 150–400)
RBC: 4.1 MIL/uL (ref 3.87–5.11)
RDW: 13 % (ref 11.5–15.5)
WBC: 8.9 10*3/uL (ref 4.0–10.5)
nRBC: 0 % (ref 0.0–0.2)

## 2020-12-24 MED ORDER — MEGESTROL ACETATE 400 MG/10ML PO SUSP
400.0000 mg | Freq: Every day | ORAL | Status: DC
  Administered 2020-12-24 – 2020-12-28 (×5): 400 mg via ORAL
  Filled 2020-12-24 (×4): qty 10

## 2020-12-24 NOTE — Progress Notes (Signed)
Physical Therapy Session Note  Patient Details  Name: Cheryl Stafford MRN: [id number] Date of Birth: 09-05-1929  Today's Date: 12/24/2020 PT Individual Time: 1056-1130 PT Individual Time Calculation (min): 34 min   Short Term Goals: Week 1:  PT Short Term Goal 1 (Week 1): STG = LTG due to ELOS  Skilled Therapeutic Interventions/Progress Updates:    Pt received in recliner and agreeable to therapy.  Pt reporting lateral rib pain and 9/10 head ache pain. Therapist provided manual trigger point release to BIL SCM and splenius cervicis muscles x~9 min. Pt then reported 0/10 headache pain which remained at bay during the rest of the session.   Rest of session focused on endurance via gait training x>300 ft with RW and CGA fading to supervision at times. Pt demoed low foot clearance and very occ R knee buckling while standing to look out window, CGA to recover. Pt able to converse normally during gait training and had no LOB. Pt returned to recliner after session and remained with legs elevated, kpad in place, was left with all needs in reach and alarm active.   Therapy Documentation Precautions:  Precautions Precautions: Fall Precaution Comments: History of falls, L rib pain, dizziness Restrictions Weight Bearing Restrictions: No RUE Weight Bearing: Weight bearing as tolerated LUE Weight Bearing: Weight bearing as tolerated RLE Weight Bearing: Weight bearing as tolerated LLE Weight Bearing: Weight bearing as tolerated     Therapy/Group: Individual Therapy  Mickel Fuchs 12/24/2020, 12:36 PM

## 2020-12-24 NOTE — Progress Notes (Signed)
Occupational Therapy Session Note  Patient Details  Name: Cheryl Stafford MRN: [id number] Date of Birth: 1929/08/24  Today's Date: 12/24/2020 OT Individual Time: 5284-1324 OT Individual Time Calculation (min): 71 min    Short Term Goals: Week 1:  OT Short Term Goal 1 (Week 1): STGs=LTGs due to ELOS  Skilled Therapeutic Interventions/Progress Updates:  Pt greeted supine in bed agreeable to OT intervention. Session focus on BADL reeducation, functional mobility, dynamic standing balance, shower transfer and decreasing  overall caregiver burden of care. Pt completed supine>sit exiting to L side of bed with supervision. CGA for sit<>stand from EOB with cues for hand placement, ambulatory transfer from EOB>sink with rw and CGA. Pt completed seated bathing at sink with overall MIN A for LB bathing d/t cleanliness from incontinent BM. Pt completed seated UB dressing with set- up assist and LB dressing with CGA needed for standing balance to pull pants up to waist line. Pt reports fatigue post bathing/ dressing needing ~ 5 min seated rest break prior to next task. Pt completed seated oral care with set- up assist from sitting in w/c. Discussed bathing at sons house where pt reports he has a Tub, pt reports she would like to be able to shower at her sons house. Transported pt to tub room with total A for time mgmt, pt completed ambulatory transfer to TTb with rw and CGA, MIN A for transfer to TTB with MIN verbal cues for sequencing and overall body mechanics. Pt transported back to room with total A where completed ~ 73ft of functional mobility from door way to recliner with rw and CGA. pt left seated in recliner with chair alarm activated and al needs within reach.                  Therapy Documentation Precautions:  Precautions Precautions: Fall Precaution Comments: History of falls, L rib pain, dizziness Restrictions Weight Bearing Restrictions: No RUE Weight Bearing: Weight bearing as tolerated LUE  Weight Bearing: Weight bearing as tolerated RLE Weight Bearing: Weight bearing as tolerated LLE Weight Bearing: Weight bearing as tolerated  Pain: unrated pain in L ribs and HA, rest breaks and heat applied as needed.     Therapy/Group: Individual Therapy  Corinne Ports [facility] 12/24/2020, 12:03 PM

## 2020-12-24 NOTE — Progress Notes (Signed)
Physical Therapy Session Note  Patient Details  Name: Cheryl Stafford MRN: [id number] Date of Birth: November 23, 1929  Today's Date: 12/24/2020 PT Individual Time: 0349-1791 + 5056-9794 PT Individual Time Calculation (min): 26 min + 69 min  Short Term Goals: Week 1:  PT Short Term Goal 1 (Week 1): STG = LTG due to ELOS  Skilled Therapeutic Interventions/Progress Updates:     1st session: Pt sitting in recliner to start session - agreeable to PT tx. Reports rib fx pain, unrated. Treatment to tolerance with distraction, mobility, and emotional support provided for pain management. Pt reports fatigue from earlier PT session that just concluded but agreeable to light activity. Sit<>Stand to RW with supervision, effortful due to pain. Ambulates with RW and supervision, ~48ft, to [facility] rehab gym - gait speed decreased but no overt LOB or knee buckling. Noted some scoliosis of T-spine in standing with shift to R. Assisted onto Nustep - completed 10 minutes using BLE/BUE with resistance set to 3 - focused on BLE strengthening and AROM with UE"s to assist with rib and trunk mobility. Pt ambulated back to her room, ~49ft, with supervision and RW. Assisted to recliner with supervision and RW and concluded session with chair alarm on and all needs in reach, informed of upcoming therapy schedule.   2nd session: Pt presents sitting in recliner and agreeable to PT tx. Denies pain at rest, but reports rib fx pain during mobility - rest breaks, guided breathing, and repositioning at end of session completed for pain management. Sit<>stand to RW with supervision, effortful. Completed stand<>pivot transfer to w/c with CGA. Transported downstairs to 69M rehab gym for time and energy conservation. Instructed in "warm up" gait training - ambulated ~46ft with CGA and RW with VC for increasing gait speed as gait speed clocked at 0.66m/s, indicative of household ambulator and increased falls risk. Pt then instructed in 6MWT, where she  ambulated a total of 381ft with supervision and RW with a very brief seated rest break at the 25min mark. Age norm for 6MWT = 1280ft. After rest break, pt instructed in stair training. She was able to navigate up/down x12, 6inch steps, with minA and 2 hand rails. VC for safety awareness with some increased difficulty during descent > ascent. Pt pleased with progress and happy that she's able to navigate stairs well, as she has 15 steps at her [name] that she would like to be able to do. Pt then worked on standing balance within // bars Tax inspector for visual feedback - standing on compliant surfaces with blue air-ex foam pads - required CGA with BUE support, minA no UE support due to posterior lean. Also demonstrates significant posterior pelvic tilt and so we worked on "stacking" her shoulders over hips to  improve postural awareness and control. Pt returned upstairs to her room where she requested to return to bed due to fatigue. Stand<>pivot transfer with CGA and RW and required minA for sit>supine for trunk support due to rib fx pain. Concluded session semi-reclined in bed with all needs in reach and bed alarms on.  Therapy Documentation Precautions:  Precautions Precautions: Fall Precaution Comments: History of falls, L rib pain, dizziness Restrictions Weight Bearing Restrictions: No RUE Weight Bearing: Weight bearing as tolerated LUE Weight Bearing: Weight bearing as tolerated RLE Weight Bearing: Weight bearing as tolerated LLE Weight Bearing: Weight bearing as tolerated General:    Therapy/Group: Individual Therapy  Cheryl Stafford 12/24/2020, 7:30 AM

## 2020-12-24 NOTE — Progress Notes (Signed)
PROGRESS NOTE   Subjective/Complaints:  Pt reports back pain is still there, but Kpad has helped. Anterior L chest/rib pain due to posterior rib fractures still painful.  LBM was yesterday- was constipated, but is better.     ROS:  Pt denies SOB, abd pain, CP, N/V/C/D, and vision changes  Objective:   No results found. Recent Labs    12/24/20 0638  WBC 8.9  HGB 12.6  HCT 38.2  PLT 359   Recent Labs    12/24/20 0638  NA 133*  K 3.8  CL 104  CO2 23  GLUCOSE 108*  BUN 23  CREATININE 0.90  CALCIUM 9.3    Intake/Output Summary (Last 24 hours) at 12/24/2020 1648 Last data filed at 12/24/2020 1324 Gross per 24 hour  Intake 480 ml  Output --  Net 480 ml        Physical Exam: Vital Signs Blood pressure 138/82, pulse 78, temperature 98 F (36.7 C), temperature source Oral, resp. rate 16, height 5\' 3"  (1.6 m), weight 57.1 kg, SpO2 98 %.   General: awake, alert, appropriate, sitting up in bed; on kpad; NAD HENT: conjugate gaze; oropharynx moist CV: regular rate; no JVD Pulmonary: CTA B/L; no W/R/R- good air movement GI: soft, NT, ND, (+)BS; hypoactive Psychiatric: appropriate Neurological: alert; Ox3 Musculoskeletal:     Cervical back: Normal range of motion. No rigidity. TTP over anterior L lower ribs and chronic TTP to middle/lumbar back    Comments: UE and LE strength 5-/5; tested Deltoid, biceps, triceps, WE< ,grip and finger abdction; as well as HF, KE, KF DF and PF  Skin:    Comments: No bruising on anterior L chest wall; skin OK; dry.warm  Neurological:     Comments: Patient is alert pleasant and cooperative.  Follows simple commands.  Makes eye contact with examiner.  Provides name and age.  She was able to recall events of her fall. Nystagmus to right side only Intact to light touch in all 4 extremities B/L   Psychiatric:        Mood and Affect: Mood normal.        Behavior: Behavior normal.     Assessment/Plan: 1. Functional deficits which require 3+ hours per day of interdisciplinary therapy in a comprehensive inpatient rehab setting. Physiatrist is providing close team supervision and 24 hour management of active medical problems listed below. Physiatrist and rehab team continue to assess barriers to discharge/monitor patient progress toward functional and medical goals  Care Tool:  Bathing    Body parts bathed by patient: Right arm, Chest, Left arm, Abdomen, Front perineal area, Right upper leg, Left upper leg, Right lower leg, Left lower leg, Face   Body parts bathed by helper: Buttocks     Bathing assist Assist Level: Minimal Assistance - Patient > 75%     Upper Body Dressing/Undressing Upper body dressing   What is the patient wearing?: Pull over shirt    Upper body assist Assist Level: Set up assist    Lower Body Dressing/Undressing Lower body dressing      What is the patient wearing?: Pants, Underwear/pull up     Lower body assist Assist for lower  body dressing: Contact Guard/Touching assist     Toileting Toileting    Toileting assist Assist for toileting: Minimal Assistance - Patient > 75%     Transfers Chair/bed transfer  Transfers assist     Chair/bed transfer assist level: Contact Guard/Touching assist     Locomotion Ambulation   Ambulation assist      Assist level: Minimal Assistance - Patient > 75% Assistive device: Walker-rolling Max distance: 90ft   Walk 10 feet activity   Assist     Assist level: Minimal Assistance - Patient > 75% Assistive device: Walker-rolling   Walk 50 feet activity   Assist Walk 50 feet with 2 turns activity did not occur: Safety/medical concerns (rib pain and dizziness)         Walk 150 feet activity   Assist Walk 150 feet activity did not occur: Safety/medical concerns         Walk 10 feet on uneven surface  activity   Assist Walk 10 feet on uneven surfaces activity did  not occur: Safety/medical concerns         Wheelchair     Assist Is the patient using a wheelchair?: Yes Type of Wheelchair: Manual    Wheelchair assist level: Dependent - Patient 0% Max wheelchair distance: 24ft    Wheelchair 50 feet with 2 turns activity    Assist        Assist Level: Dependent - Patient 0% (per PT documentation)   Wheelchair 150 feet activity     Assist      Assist Level: Dependent - Patient 0% (per PT documentation)   Blood pressure 138/82, pulse 78, temperature 98 F (36.7 C), temperature source Oral, resp. rate 16, height 5\' 3"  (1.6 m), weight 57.1 kg, SpO2 98 %.  Medical Problem List and Plan: 1. Functional deficits with decreased in gait and dizziness secondary to traumatic SAH             -patient may  shower             -ELOS/Goals: 7-10 days supervision to min A  Continue CIR- PT, OT  2.  Antithrombotics: -DVT/anticoagulation:  Mechanical: Antiembolism stockings, thigh (TED hose) Bilateral lower extremities             -antiplatelet therapy: N/A 3. Pain Management: Neurontin 300 mg nightly, Lidoderm patch as directed, tramadol as needed  12/19- using kpad- will con't regimen for pain.  4. Mood: Lexapro 20 mg daily             -antipsychotic agents: N/A 5. Neuropsych: This patient is capable of making decisions on her own behalf. 6. Skin/Wound Care: Routine skin checks 7. Fluids/Electrolytes/Nutrition: Routine in and outs with follow-up chemistries 8.  Hypothyroidism.  Synthroid 9.  Hypertension.  Inderal 10 mg twice daily Vitals:   12/24/20 0506 12/24/20 1334  BP: 136/69 138/82  Pulse: 66 78  Resp: 16 16  Temp: 98 F (36.7 C) 98 F (36.7 C)  SpO2: 95% 98%   12/19- BP controlled- con't regimen 10.  Restless leg syndrome.  Continue Requip/Mirapex 0.25 mg nightly 11. Constipation- LBM 1 week ago- had miralax x2 days- if no BM, needs Sorbitol.  12/19- constipation improved- LBM yesterday 12. Vertigo: increase meclizine  to 25mg  TID. Vestibular eval and treatment. Avoid looking to the left 13. Headache: increase Topamax to 50 mg BID.  12/19- HA slightly better- con't regimen 14. Decreased appetite: discussed that this is normal post- SAH. May be related to constipation as well.  12/19- will increase Megace to 400 mg daily.  15. Bradycardia: d/c inderal    LOS: 6 days A FACE TO FACE EVALUATION WAS PERFORMED  Patricia Fargo 12/24/2020, 4:48 PM

## 2020-12-25 MED ORDER — LIDOCAINE 5 % EX PTCH: 2.0000 | MEDICATED_PATCH | Freq: Every day | CUTANEOUS | Status: DC

## 2020-12-25 MED ORDER — LIDOCAINE 5 % EX PTCH
2.0000 | MEDICATED_PATCH | Freq: Every day | CUTANEOUS | Status: DC
Start: 2020-12-25 — End: 2020-12-28
  Administered 2020-12-25 – 2020-12-28 (×4): 2 via TRANSDERMAL
  Filled 2020-12-25 (×3): qty 2

## 2020-12-25 NOTE — Progress Notes (Signed)
Occupational Therapy Session Note  Patient Details  Name: Cheryl Stafford MRN: [id number] Date of Birth: 1929/05/09  Today's Date: 12/25/2020 OT Individual Time: 1400-1430 OT Individual Time Calculation (min): 30 min    Short Term Goals: Week 1:  OT Short Term Goal 1 (Week 1): STGs=LTGs due to ELOS   Skilled Therapeutic Interventions/Progress Updates:    First session: Pt sitting up in recliner first session attempt, pt c/o 5-6/10 pain in left ribcage and requesting medication.  Nurse made aware.  Pt requesting to rest due to pain and to defer this session, try again during next scheduled session.  Missed 60 minutes of treatment.  Second session: Pt sitting up in w/c, just having finished with PT. Reports pain is much better since receiving medication and agreeable to session.  Transported pt to tub shower and reviewed safe transfer technique using tub bench.  Pt completed with close supervision and step by step cues using RW at ambulation level.  Pt returned to room and requested back to bed.  Step pivot w/c to EOB with supervision. Stand to sit and sit to supine with close supervision.  Call bell in reach, bed alarm on.  Improved independence during tub shower transfer today compared to last session.  Therapy Documentation Precautions:  Precautions Precautions: Fall Precaution Comments: History of falls, L rib pain, dizziness Restrictions Weight Bearing Restrictions: No RUE Weight Bearing: Weight bearing as tolerated LUE Weight Bearing: Weight bearing as tolerated RLE Weight Bearing: Weight bearing as tolerated LLE Weight Bearing: Weight bearing as tolerated   Therapy/Group: Individual Therapy  Ezekiel Slocumb 12/25/2020, 4:04 PM

## 2020-12-25 NOTE — Progress Notes (Signed)
Patient ID: Cheryl Stafford, female   DOB: 1929-04-13, 87 y.o.   MRN: [id number]  Daughter called and wanted to know how Mom is doing and when is her DC date. Discussed possibly Friday but will confirm tomorrow after team conference. Making progress with her ambulation, still having rib pain and dizziness but better. Will confirm with pt and daughter tomorrow after team conference.

## 2020-12-25 NOTE — Progress Notes (Signed)
PROGRESS NOTE   Subjective/Complaints:  Labs reviewed  Rib pain left side worse during the day    ROS:  Pt denies SOB, abd pain, CP, N/V/C/D, and vision changes  Objective:   No results found. Recent Labs    12/24/20 0638  WBC 8.9  HGB 12.6  HCT 38.2  PLT 359    Recent Labs    12/24/20 0638  NA 133*  K 3.8  CL 104  CO2 23  GLUCOSE 108*  BUN 23  CREATININE 0.90  CALCIUM 9.3     Intake/Output Summary (Last 24 hours) at 12/25/2020 0756 Last data filed at 12/24/2020 1837 Gross per 24 hour  Intake 360 ml  Output --  Net 360 ml         Physical Exam: Vital Signs Blood pressure 123/69, pulse 73, temperature 98.2 F (36.8 C), resp. rate 16, height 5\' 3"  (1.6 m), weight 57.1 kg, SpO2 95 %.   General: No acute distress Mood and affect are appropriate Heart: Regular rate and rhythm no rubs murmurs or extra sounds Lungs: Clear to auscultation, breathing unlabored, no rales or wheezes Abdomen: Positive bowel sounds, soft nontender to palpation, nondistended Extremities: No clubbing, cyanosis, or edema Skin: No evidence of breakdown, no evidence of rash   Musculoskeletal:     Cervical back: Normal range of motion. + TTP over anterior L lower ribs and chronic TTP to middle/lumbar back    Comments: UE and LE strength 5-/5; tested Deltoid, biceps, triceps, WE< ,grip and finger abdction; as well as HF, KE, KF DF and PF  Skin:    Comments: No bruising on anterior L chest wall; skin OK; dry.warm  Neurological:     Comments: Patient is alert pleasant and cooperative.  Follows simple commands.  Makes eye contact with examiner.  Provides name and age.  She was able to recall events of her fall. Nystagmus to right side only Intact to light touch in all 4 extremities B/L   Psychiatric:        Mood and Affect: Mood normal.        Behavior: Behavior normal.    Assessment/Plan: 1. Functional deficits which  require 3+ hours per day of interdisciplinary therapy in a comprehensive inpatient rehab setting. Physiatrist is providing close team supervision and 24 hour management of active medical problems listed below. Physiatrist and rehab team continue to assess barriers to discharge/monitor patient progress toward functional and medical goals  Care Tool:  Bathing    Body parts bathed by patient: Right arm, Chest, Left arm, Abdomen, Front perineal area, Right upper leg, Left upper leg, Right lower leg, Left lower leg, Face   Body parts bathed by helper: Buttocks     Bathing assist Assist Level: Minimal Assistance - Patient > 75%     Upper Body Dressing/Undressing Upper body dressing   What is the patient wearing?: Pull over shirt    Upper body assist Assist Level: Set up assist    Lower Body Dressing/Undressing Lower body dressing      What is the patient wearing?: Pants, Underwear/pull up     Lower body assist Assist for lower body dressing: Contact Guard/Touching assist  Toileting Toileting    Toileting assist Assist for toileting: Minimal Assistance - Patient > 75%     Transfers Chair/bed transfer  Transfers assist     Chair/bed transfer assist level: Contact Guard/Touching assist     Locomotion Ambulation   Ambulation assist      Assist level: Minimal Assistance - Patient > 75% Assistive device: Walker-rolling Max distance: 47ft   Walk 10 feet activity   Assist     Assist level: Minimal Assistance - Patient > 75% Assistive device: Walker-rolling   Walk 50 feet activity   Assist Walk 50 feet with 2 turns activity did not occur: Safety/medical concerns (rib pain and dizziness)         Walk 150 feet activity   Assist Walk 150 feet activity did not occur: Safety/medical concerns         Walk 10 feet on uneven surface  activity   Assist Walk 10 feet on uneven surfaces activity did not occur: Safety/medical concerns          Wheelchair     Assist Is the patient using a wheelchair?: Yes Type of Wheelchair: Manual    Wheelchair assist level: Dependent - Patient 0% Max wheelchair distance: 42ft    Wheelchair 50 feet with 2 turns activity    Assist        Assist Level: Dependent - Patient 0% (per PT documentation)   Wheelchair 150 feet activity     Assist      Assist Level: Dependent - Patient 0% (per PT documentation)   Blood pressure 123/69, pulse 73, temperature 98.2 F (36.8 C), resp. rate 16, height 5\' 3"  (1.6 m), weight 57.1 kg, SpO2 95 %.  Medical Problem List and Plan: 1. Functional deficits with decreased in gait and dizziness secondary to traumatic SAH             -patient may  shower             -ELOS/Goals: 7-10 days supervision to min A, team conf in am   Continue CIR- PT, OT  2.  Antithrombotics: -DVT/anticoagulation:  Mechanical: Antiembolism stockings, thigh (TED hose) Bilateral lower extremities             -antiplatelet therapy: N/A 3. Pain Management: Neurontin 300 mg nightly, Lidoderm patch as directed adjusted order , tramadol as needed  12/19- using kpad- will con't regimen for pain.  4. Mood: Lexapro 20 mg daily             -antipsychotic agents: N/A 5. Neuropsych: This patient is capable of making decisions on her own behalf. 6. Skin/Wound Care: Routine skin checks 7. Fluids/Electrolytes/Nutrition: Routine in and outs with follow-up chemistries 8.  Hypothyroidism.  Synthroid 9.  Hypertension.  Inderal 10 mg twice daily Vitals:   12/24/20 2013 12/25/20 0416  BP: 118/78 123/69  Pulse: 76 73  Resp: 18 16  Temp: 98.6 F (37 C) 98.2 F (36.8 C)  SpO2: 96% 95%   12/20 controlled  10.  Restless leg syndrome.  Continue Requip/Mirapex 0.25 mg nightly 11. Constipation- LBM 1 week ago- had miralax x2 days- if no BM, needs Sorbitol.  12/19- constipation improved- LBM yesterday 12. Vertigo: increase meclizine to 25mg  TID. Vestibular eval and treatment. Avoid  looking to the left 13. Headache: increase Topamax to 50 mg BID.  12/19- HA slightly better- con't regimen 14. Decreased appetite: discussed that this is normal post- SAH. May be related to constipation as well.   12/19- will increase Megace  to 400 mg daily.  15. Bradycardia: d/c inderal    LOS: 7 days A FACE TO FACE EVALUATION WAS PERFORMED  Charlett Blake 12/25/2020, 7:56 AM

## 2020-12-25 NOTE — Progress Notes (Addendum)
Physical Therapy Session Note  Patient Details  Name: Leiya Keesey MRN: [id number] Date of Birth: 03-Aug-1929  Today's Date: 12/25/2020 PT Individual Time: 1300-1356 PT Individual Time Calculation (min): 56 min   Short Term Goals: Week 1:  PT Short Term Goal 1 (Week 1): STG = LTG due to ELOS  Skilled Therapeutic Interventions/Progress Updates:    Patient received sitting up in recliner, agreeable to PT. She reports 3/10 primarily in L ribs, premedicated. PT providing rest breaks, distractions and repositioning to assist with pain management. Upon standing, patient found to be incontinent. Ambulatory transfer to toilet with RW and CGA. Further void. MaxA clothing management in standing. Supervision perihygiene. PT donning sneakers TotalA for time. She completed hand hygiene standing at sink with CGA. PT transporting patient in wc to therapy gym for time management and energy conservation. She ambulated 22ft x2 with RW and CGA with seated rest break between. Verbal cues needed for upright posture (limited by h/o scoliosis). Very slow gait speed maintained. Patient completing standing toe taps 2x1.5 mins with B UE support and CGA. Patient noting fatigue after completing this task and decreased ability to motor plan this task requiring tactile cuing to step to the appropriate side of the box. She also notes anxiety regarding impending d/c home. PT discussing home safety with patient- receptive of education. Patient returning to room in wc, needs within reach. Incontinence + decrease in motor planning communicated to NT, primary OT and RN as this is a change from patients baseline.   Therapy Documentation Precautions:  Precautions Precautions: Fall Precaution Comments: History of falls, L rib pain, dizziness Restrictions Weight Bearing Restrictions: No RUE Weight Bearing: Weight bearing as tolerated LUE Weight Bearing: Weight bearing as tolerated RLE Weight Bearing: Weight bearing as  tolerated LLE Weight Bearing: Weight bearing as tolerated    Therapy/Group: Individual Therapy  Karoline Caldwell, PT, DPT, CBIS  12/25/2020, 9:35 AM

## 2020-12-25 NOTE — Progress Notes (Signed)
Physical Therapy Session Note  Patient Details  Name: Cheryl Stafford MRN: [id number] Date of Birth: 1929-06-24  Today's Date: 12/25/2020 PT Individual Time: 0900-0958 PT Individual Time Calculation (min): 58 min   Short Term Goals: Week 1:  PT Short Term Goal 1 (Week 1): STG = LTG due to ELOS  Skilled Therapeutic Interventions/Progress Updates:     Pt received supine in bed, agreeable to PT tx. Reports 5/10 L rib fx pain and also some indigestion. Reports medication was provided prior to PT arrival.  Provided rest breaks, mobility, distraction for pain management. Focused session on increasing activity tolerance, DC planning, gait training.   Pt completed supine<>sit with supervision assist with HOB slightly elevated. Completed stand<>pivot transfer to w/c with RW and supervision, slowed and effortful related to rib fx and discomfort with indigestion.  Transported in w/c to 26M rehab gym for time and energy conservation.  Completes sit<>stand to RW from w/c height with supervision with VC needed for hand placement to push from armrests rather than RW. Ambulated ~167f with supervision and RW - primary gait deficits are downward gaze, decreased gait speed, and short shuffling steps - min VC for normalizing gait pattern.   Spent time discussing DC planning as pt wishing to return to her home where she lives alone. Plan for DC to son's house where 24/7 provided. Pt expressing interest in ALF vs home with hired assist and we discussed this as well.   Her son's home as 3 STE with no rails - she reports she rely's on his assist for navigating these. Focused remainder of session on stair training. Attempted to navigate steps with no rails with HHA but pt fearful of falling and requesting to use 1 hand rail and HHA on R, requiring minA for stability. After seated rest break, she navigated up/down x4 steps with supervision while using 2 rails. Discussed having hand rails installed at son's house and her  house to assist with home entrance and she expressed understanding.  Pt reporting some lightheadedness while navigating stairs. Returned upstairs and BP assessed in sitting vs standing. 136/85 sitting 125/79 standing Discussed importance of adequate nutrition to assist with managing BP.  Pt concluded session seated in recliner with chair alarm on, all immediate needs met, k-pad on ribs.   Therapy Documentation Precautions:  Precautions Precautions: Fall Precaution Comments: History of falls, L rib pain, dizziness Restrictions Weight Bearing Restrictions: No RUE Weight Bearing: Weight bearing as tolerated LUE Weight Bearing: Weight bearing as tolerated RLE Weight Bearing: Weight bearing as tolerated LLE Weight Bearing: Weight bearing as tolerated General:    Therapy/Group: Individual Therapy  [name]12/20/2022, 7:31 AM

## 2020-12-25 NOTE — Progress Notes (Signed)
Patient resting in bed at this time. Patient has had 2 incontinent episodes thus far this shift. Patient is normally continent of bowel and bladder. Patient states she has had increased indigestion. Daughter call nursing desk this AM inquiring if her mother was being [place] tomorrow, patient told daughter she is being D/C. Transferred daughter to SW. Cal light within reach, personal items within reach. Safety maintained.

## 2020-12-26 NOTE — Patient Care Conference (Signed)
Inpatient RehabilitationTeam Conference and Plan of Care Update Date: 12/26/2020   Time: 11:30 AM    Patient Name: Cheryl Stafford      Medical Record Number: [id number]  Date of Birth: September 24, 1929 Sex: Female         Room/Bed: 5C18C/5C18C-01 Payor Info: Payor: MEDICARE / Plan: MEDICARE PART A AND B / Product Type: *No Product type* /    Admit Date/Time:  12/18/2020  4:33 PM  Primary Diagnosis:  Traumatic subarachnoid hemorrhage  Hospital Problems: Principal Problem:   Traumatic subarachnoid hemorrhage    Expected Discharge Date: Expected Discharge Date: 12/28/20  Team Members Present: Physician leading conference: Dr. Alysia Penna Social Worker Present: Ovidio Kin, LCSW Nurse Present: Dorien Chihuahua, RN PT Present: Ginnie Smart, PT OT Present: Leretha Pol, OT PPS Coordinator present : Gunnar Fusi, SLP     Current Status/Progress Goal Weekly Team Focus  Bowel/Bladder   Incontinent of B/B. LBM 12/25/20  Regain B/B continence.      Swallow/Nutrition/ Hydration             ADL's   supervision UB/LB bathing and dressing, min assist toileting 2/2 incontinence, CGA-sup using RW for functional transfers  supervision; downgrading toileting to min assist  functional transfer training, self care training, pain management education, dc planning   Mobility   CGA/supervision overall.  Supervision  DC planning, family education, home safety training   Communication             Safety/Cognition/ Behavioral Observations            Pain   Denies pain.  Remain pain free.  Assess Q shift and prn   Skin   Skin Intact  Maintain skin integrity. No new breakdowns  Assess Q shift and prn.     Discharge Planning:  Going to son's home upon discharge where someone will be with her. She hopes to go back to her home eventually   Team Discussion: Medically stable per MD. Incontinence of bowel and bladder improved with toileting. Ativan for tremors.  Patient on target to meet  rehab goals: Currently CGA - supervision for transfers and CGA for toileting. Ambulation is effortful due to pain. Able to ambulate up to 150' using a RW with supervision. Overall goals for discharge set for supervision level.  *See Care Plan and progress notes for long and short-term goals.   Revisions to Treatment Plan:  Premedicate for therapy/activity Downgraded OT goals   Teaching Needs: Discharge assistance, toileting, transfers, medication management, etc.   Current Barriers to Discharge: Decreased caregiver support  Possible Resolutions to Barriers: Family education scheduled for 12/27/20 DME: TTB     Medical Summary Current Status: Left rib pain, Has left neglect, + urinary stress incont  Barriers to Discharge: Medical stability;Incontinence   Possible Resolutions to [employer] Focus: cont with current pain management   Continued Need for Acute Rehabilitation Level of Care: The patient requires daily medical management by a physician with specialized training in physical medicine and rehabilitation for the following reasons: Direction of a multidisciplinary physical rehabilitation program to maximize functional independence : Yes Medical management of patient stability for increased activity during participation in an intensive rehabilitation regime.: Yes Analysis of laboratory values and/or radiology reports with any subsequent need for medication adjustment and/or medical intervention. : Yes   I attest that I was present, lead the team conference, and concur with the assessment and plan of the team.   Dorien Chihuahua B 12/26/2020, 11:38 AM

## 2020-12-26 NOTE — Progress Notes (Signed)
Occupational Therapy Session Note  Patient Details  Name: Cheryl Stafford MRN: [id number] Date of Birth: 15-Feb-1929  Today's Date: 12/26/2020 OT Individual Time: 8677-3736 OT Individual Time Calculation (min): 60 min    Short Term Goals: Week 1:  OT Short Term Goal 1 (Week 1): STGs=LTGs due to ELOS  Skilled Therapeutic Interventions/Progress Updates:    Pt sitting up in recliner, requesting to shower.  Pt retrieved ADL items with CGA at RW from lower drawer. Pt completed shower bench transfer with supervision and completed all UB/LB dressing and bathing with supervision. Shower bench transfer and step pivot to commode with supervision using RW.  Pt donned shirt, brief, and pants with supervision.  Ambulated to sink using RW with supervision and blow dried hair with mod assist.  Setup with lunch tray, call bell in reach. Therapy Documentation Precautions:  Precautions Precautions: Fall Precaution Comments: History of falls, L rib pain, dizziness Restrictions Weight Bearing Restrictions: No RUE Weight Bearing: Weight bearing as tolerated LUE Weight Bearing: Weight bearing as tolerated RLE Weight Bearing: Weight bearing as tolerated LLE Weight Bearing: Weight bearing as tolerated    Therapy/Group: Individual Therapy  Ezekiel Slocumb 12/26/2020, 3:35 PM

## 2020-12-26 NOTE — Progress Notes (Signed)
PROGRESS NOTE   Subjective/Complaints: No issues overnite   ROS:  Pt denies SOB, abd pain, CP, N/V/C/D, and vision changes  Objective:   No results found. Recent Labs    12/24/20 0638  WBC 8.9  HGB 12.6  HCT 38.2  PLT 359    Recent Labs    12/24/20 0638  NA 133*  K 3.8  CL 104  CO2 23  GLUCOSE 108*  BUN 23  CREATININE 0.90  CALCIUM 9.3     Intake/Output Summary (Last 24 hours) at 12/26/2020 0856 Last data filed at 12/26/2020 0816 Gross per 24 hour  Intake 580 ml  Output --  Net 580 ml         Physical Exam: Vital Signs Blood pressure (!) 143/76, pulse 72, temperature 97.8 F (36.6 C), temperature source Oral, resp. rate 18, height _0  (1.6 m), weight 55.6 kg, SpO2 96 %.  General: No acute distress Mood and affect are appropriate Heart: Regular rate and rhythm no rubs murmurs or extra sounds Lungs: Clear to auscultation, breathing unlabored, no rales or wheezes Abdomen: Positive bowel sounds, soft nontender to palpation, nondistended Extremities: No clubbing, cyanosis, or edema Skin: No evidence of breakdown, no evidence of rash  Musculoskeletal:     Cervical back: Normal range of motion. + TTP over anterior L lower ribs and chronic TTP to middle/lumbar back    Comments: UE and LE strength 5-/5; tested Deltoid, biceps, triceps, WE< ,grip and finger abdction; as well as HF, KE, KF DF and PF  Skin:    Comments: No bruising on anterior L chest wall; skin OK; dry.warm  Neurological:     Comments: Patient is alert pleasant and cooperative.  Follows simple commands.  Makes eye contact with examiner.  Provides name and age.  She was able to recall events of her fall. Nystagmus to right side only Intact to light touch in all 4 extremities B/L   Psychiatric:        Mood and Affect: Mood normal.        Behavior: Behavior normal.    Assessment/Plan: 1. Functional deficits which require 3+ hours  per day of interdisciplinary therapy in a comprehensive inpatient rehab setting. Physiatrist is providing close team supervision and 24 hour management of active medical problems listed below. Physiatrist and rehab team continue to assess barriers to discharge/monitor patient progress toward functional and medical goals  Care Tool:  Bathing    Body parts bathed by patient: Right arm, Chest, Left arm, Abdomen, Front perineal area, Right upper leg, Left upper leg, Right lower leg, Left lower leg, Face   Body parts bathed by helper: Buttocks     Bathing assist Assist Level: Minimal Assistance - Patient > 75%     Upper Body Dressing/Undressing Upper body dressing   What is the patient wearing?: Pull over shirt    Upper body assist Assist Level: Set up assist    Lower Body Dressing/Undressing Lower body dressing      What is the patient wearing?: Pants, Underwear/pull up     Lower body assist Assist for lower body dressing: Contact Guard/Touching assist     Toileting Toileting  Toileting assist Assist for toileting: Minimal Assistance - Patient > 75%     Transfers Chair/bed transfer  Transfers assist     Chair/bed transfer assist level: Supervision/Verbal cueing     Locomotion Ambulation   Ambulation assist      Assist level: Supervision/Verbal cueing Assistive device: Walker-rolling Max distance: 150f   Walk 10 feet activity   Assist     Assist level: Supervision/Verbal cueing Assistive device: Walker-rolling   Walk 50 feet activity   Assist Walk 50 feet with 2 turns activity did not occur: Safety/medical concerns (rib pain and dizziness)  Assist level: Supervision/Verbal cueing Assistive device: Walker-rolling    Walk 150 feet activity   Assist Walk 150 feet activity did not occur: Safety/medical concerns  Assist level: Supervision/Verbal cueing Assistive device: Walker-rolling    Walk 10 feet on uneven surface   activity   Assist Walk 10 feet on uneven surfaces activity did not occur: Safety/medical concerns         Wheelchair     Assist Is the patient using a wheelchair?: Yes Type of Wheelchair: Manual    Wheelchair assist level: Dependent - Patient 0% Max wheelchair distance: 243f   Wheelchair 50 feet with 2 turns activity    Assist        Assist Level: Dependent - Patient 0% (per PT documentation)   Wheelchair 150 feet activity     Assist      Assist Level: Dependent - Patient 0% (per PT documentation)   Blood pressure (!) 143/76, pulse 72, temperature 97.8 F (36.6 C), temperature source Oral, resp. rate 18, height _0  (1.6 m), weight 55.6 kg, SpO2 96 %.  Medical Problem List and Plan: 1. Functional deficits with decreased in gait and dizziness secondary to traumatic SAH, RIght SDH- left neglect              -patient may  shower             -ELOS/Goals: 7-10 days supervision to min A, Team conference today please see physician documentation under team conference tab, met with team  to discuss problems,progress, and goals. Formulized individual treatment plan based on medical history, underlying problem and comorbidities.   Continue CIR- PT, OT  2.  Antithrombotics: -DVT/anticoagulation:  Mechanical: Antiembolism stockings, thigh (TED hose) Bilateral lower extremities             -antiplatelet therapy: N/A 3. Pain Management: Neurontin 300 mg nightly, Lidoderm patch as directed adjusted order , tramadol as needed  12/19- using kpad- will con't regimen for pain.  4. Mood: Lexapro 20 mg daily             -antipsychotic agents: N/A 5. Neuropsych: This patient is capable of making decisions on her own behalf. 6. Skin/Wound Care: Routine skin checks 7. Fluids/Electrolytes/Nutrition: Routine in and outs with follow-up chemistries 8.  Hypothyroidism.  Synthroid 9.  Hypertension.  Inderal 10 mg twice daily- stopped due to bradycardia  Vitals:   12/25/20 1946  12/26/20 0434  BP: (!) 110/57 (!) 143/76  Pulse: 72 72  Resp: 18 18  Temp: 98.1 F (36.7 C) 97.8 F (36.6 C)  SpO2: 97% 96%   12/21 controlled but showing lability , check ortho vitals  10.  Restless leg syndrome.  Continue Requip/Mirapex 0.25 mg nightly 11. Constipation- LBM 1 week ago- had miralax x2 days- if no BM, needs Sorbitol.  12/19- constipation improved- LBM yesterday 12. Vertigo: increase meclizine to 256mID. Vestibular eval and treatment.  Avoid looking to the left 13. Headache: increase Topamax to 50 mg BID.  12/19- HA slightly better- con't regimen 14. Decreased appetite: discussed that this is normal post- SAH. May be related to constipation as well.   12/19- will increase Megace to 400 mg daily.  15. Bradycardia: d/c inderal    LOS: 8 days A FACE TO FACE EVALUATION WAS PERFORMED  Charlett Blake 12/26/2020, 8:56 AM

## 2020-12-26 NOTE — Progress Notes (Signed)
Physical Therapy Session Note  Patient Details  Name: Cheryl Stafford MRN: [id number] Date of Birth: 19-Oct-1929  Today's Date: 12/26/2020 PT Individual Time: 0805-0900 PT Individual Time Calculation (min): 55 min   Short Term Goals: Week 1:  PT Short Term Goal 1 (Week 1): STG = LTG due to ELOS  Skilled Therapeutic Interventions/Progress Updates: Pt presented in bed agreeable to therapy. Pt states some pain in L ribs 5/10, premedicated and no additional intervention requested. Pt agreeable to try bathroom prior leaving room. Pt performd supine to sit with CGA and use of bed features. PTA donned shoes total A for time management. Pt ambulated to bathroom CGA and performed toilet transfers with close supervison and verbal cues (+ urinary void). Pt then ambulated to sink CGA and performed stand hygiene in standing with supervision. Pt then walked backwards to w/c with minA and transported to 4th floor rehab gym. Pt participate din ambulation for endurance and general conditioning 157f with CGA. PTA incorporated agility ladder into ambulation and pt ambulated across agility ladder with RW attempting to step over rungs. Pt required minA on first bout for sequencing but improved significantly on second bout. Pt then ambulated to stairs with RW and performed step ups on 6in steps x 5 forward and laterally bilaterally. Pt transported back to room at end of session and performed ambulatory transfer to recliner CGA. Pt set up in recliner at end of session with seat alarm on call bell within reach and needs met.      Therapy Documentation Precautions:  Precautions Precautions: Fall Precaution Comments: History of falls, L rib pain, dizziness Restrictions Weight Bearing Restrictions: No RUE Weight Bearing: Weight bearing as tolerated LUE Weight Bearing: Weight bearing as tolerated RLE Weight Bearing: Weight bearing as tolerated LLE Weight Bearing: Weight bearing as tolerated General:   Vital  Signs: Therapy Vitals Temp: (!) 97.4 F (36.3 C) Temp Source: Oral Pulse Rate: 78 Resp: 16 BP: 132/62 Patient Position (if appropriate): Lying Oxygen Therapy SpO2: 99 % O2 Device: Room Air Pain: Pain Assessment Pain Scale: 0-10 Pain Score: 0-No pain Mobility:   Locomotion :    Trunk/Postural Assessment :    Balance:   Exercises:   Other Treatments:      Therapy/Group: Individual Therapy  Ab Leaming 12/26/2020, 4:25 PM

## 2020-12-26 NOTE — Progress Notes (Signed)
Inpatient Rehabilitation Discharge Medication Review by a Pharmacist  A complete drug regimen review was completed for this patient to identify any potential clinically significant medication issues.  High Risk Drug Classes Is patient taking? Indication by Medication  Antipsychotic No   Anticoagulant No   Antibiotic No   Opioid Yes Tramadol for pain  Antiplatelet No   Hypoglycemics/insulin No   Vasoactive Medication No   Chemotherapy No   Other Yes Lexapro,  prn lorazepam for mood/anxiety Neurontin, Topamax for neuropathy, pain Synthroid for hypothyroidism Protonix for GERD     Type of Medication Issue Identified Description of Issue Recommendation(s)  Drug Interaction(s) (clinically significant)     Duplicate Therapy     Allergy     No Medication Administration End Date     Incorrect Dose     Additional Drug Therapy Needed     Significant med changes from prior encounter (inform family/care partners about these prior to discharge).    Other       Clinically significant medication issues were identified that warrant physician communication and completion of prescribed/recommended actions by midnight of the next day:  No  Pharmacist comments:   Time spent performing this drug regimen review (minutes): 20 minutes   Tad Moore 12/26/2020 2:04 PM

## 2020-12-26 NOTE — Discharge Summary (Signed)
Physician Discharge Summary  Patient ID: Lajuanda Penick MRN: [id number] DOB/AGE: 85-Oct-1931 85 y.o.  Admit date: 12/18/2020 Discharge date: 12/28/2020  Discharge Diagnoses:  Principal Problem:   Traumatic subarachnoid hemorrhage Hypertension Mood stabilization Hypothyroidism Restless leg syndrome Constipation Iron deficiency anemia Type 2 diabetes mellitus GERD Bradycardia  Discharged Condition: Stable  Significant Diagnostic Studies: DG Chest 1 View  Result Date: 12/11/2020 CLINICAL DATA:  Fall. EXAM: CHEST  1 VIEW COMPARISON:  Chest x-ray 08/14/2007.  CT PE protocol 04/27/2003. FINDINGS: The cardiac silhouette is moderately enlarged, a new finding. The lungs and costophrenic angles are clear. There is no pneumothorax. Peripherally calcified rounded density in the lower right neck measuring 2 cm is unchanged from prior examination corresponding to peripherally calcified right thyroid nodule. The osseous structures are within normal limits. IMPRESSION: 1. New moderate enlargement of the cardiac silhouette may represent cardiomegaly and/or pericardial effusion. 2. Lungs are clear. Electronically Signed   By: Ronney Asters M.D.   On: 12/11/2020 23:59   DG Ribs Unilateral Left  Result Date: 12/13/2020 CLINICAL DATA:  Trauma, fall EXAM: LEFT RIBS - 2 VIEW COMPARISON:  None. FINDINGS: No displaced fracture or dislocation is seen. Osteopenia is seen in bony structures. A skin marker is noted in the lateral aspect of left lower ribs. There is dextroscoliosis at thoracolumbar junction. Degenerative changes are noted in the thoracolumbar junction. IMPRESSION: No displaced fracture is seen in the left ribs. Electronically Signed   By: Elmer Picker M.D.   On: 12/13/2020 16:41   DG Lumbar Spine Complete  Result Date: 12/11/2020 CLINICAL DATA:  Fall EXAM: LUMBAR SPINE - COMPLETE 4+ VIEW COMPARISON:  02/29/2016 FINDINGS: Dextroscoliosis. Vertebral body heights are maintained. Multilevel  facet arthrosis. IMPRESSION: No acute fracture or static subluxation of the lumbar spine. Marked right convex scoliosis. Electronically Signed   By: Ulyses Jarred M.D.   On: 12/11/2020 23:59   CT HEAD WO CONTRAST (5MM)  Result Date: 12/14/2020 CLINICAL DATA:  Head trauma.  Follow-up intracranial hemorrhage EXAM: CT HEAD WITHOUT CONTRAST TECHNIQUE: Contiguous axial images were obtained from the base of the skull through the vertex without intravenous contrast. COMPARISON:  CT head 12/11/2020 FINDINGS: Brain: Resolution of small amount of subarachnoid hemorrhage in the right temporal region. Small subdural hematoma along the posterior falx and right tentorium. This was present previously however has migrated posteriorly in the interval. Question new hypodensity right temporal lobe. Reference axial image 12. This could be due to acute infarct or possibly artifact. Mild atrophy. Moderate chronic microvascular ischemic change throughout the white matter. Vascular: Negative for hyperdense vessel Skull: Negative Sinuses/Orbits: Air-fluid level sphenoid sinus. Remaining paranasal sinuses clear. Bilateral ocular surgery. Other: None IMPRESSION: 1. Interval resolution of small volume right temporal subarachnoid hemorrhage 2. Small subdural hematoma along the posterior falx and tentorium on the right. There was subdural hemorrhage along the tentorium previously but this has migrated posteriorly. 3. Atrophy and chronic microvascular ischemia 4. Question new area of low attenuation in the right temporal lobe which could be due to acute infarct or artifact. Correlate with symptoms. MRI may be helpful for further evaluation. Electronically Signed   By: Franchot Gallo M.D.   On: 12/14/2020 12:27   CT Head Wo Contrast  Result Date: 12/11/2020 CLINICAL DATA:  Trauma. EXAM: CT HEAD WITHOUT CONTRAST CT CERVICAL SPINE WITHOUT CONTRAST TECHNIQUE: Multidetector CT imaging of the head and cervical spine was performed following the  standard protocol without intravenous contrast. Multiplanar CT image reconstructions of the cervical spine were also generated.  COMPARISON:  None. FINDINGS: CT HEAD FINDINGS Brain: There is minimal right temporal subarachnoid hemorrhage (12/3, 33/5). No mass effect or midline shift. There is mild age-related atrophy and chronic microvascular ischemic changes. Forty Vascular: No hyperdense vessel or unexpected calcification. Skull: Normal. Negative for fracture or focal lesion. Sinuses/Orbits: No acute finding. Other: Left posterior parietal scalp laceration and hematoma. CT CERVICAL SPINE FINDINGS Alignment: No acute subluxation. There is straightening of normal cervical lordosis which may be positional or due to muscle spasm. Skull base and vertebrae: No acute fracture. Soft tissues and spinal canal: No prevertebral fluid or swelling. No visible canal hematoma. Disc levels:  Multilevel degenerative changes Upper chest: Biapical subpleural scarring. Other: A 2 cm rim calcified right thyroid nodule present since 2005. Stability for greater than 5 years implies benignity; no biopsy or followup indicated (ref: J Am Coll Radiol. 2015 Feb;12(2): 143-50). IMPRESSION: 1. Minimal right temporal subarachnoid hemorrhage. No mass effect or midline shift. 2. No acute/traumatic cervical spine pathology. These results were called by telephone at the time of interpretation on 12/11/2020 at 9:57 pm to provider [facility] , who verbally acknowledged these results. Electronically Signed   By: Anner Crete M.D.   On: 12/11/2020 22:03   CT CHEST WO CONTRAST  Result Date: 12/18/2020 CLINICAL DATA:  Fall, persistent chest pain EXAM: CT CHEST WITHOUT CONTRAST TECHNIQUE: Multidetector CT imaging of the chest was performed following the standard protocol without IV contrast. COMPARISON:  None. FINDINGS: Cardiovascular: Aortic atherosclerosis. Normal heart size. Three-vessel coronary artery calcifications. No pericardial  effusion. Mediastinum/Nodes: No enlarged mediastinal, hilar, or axillary lymph nodes. Large hiatal hernia with intrathoracic position of the gastric body and fundus. Thyroid gland, trachea, and esophagus demonstrate no significant findings. Lungs/Pleura: Lungs are clear.  Small left pleural effusion. Upper Abdomen: No acute abnormality. Small gallstone in the dependent gallbladder. Musculoskeletal: No chest wall mass or suspicious bone lesions identified. Minimally displaced fractures of the posterior left eighth, ninth, and tenth ribs. IMPRESSION: 1. Minimally displaced fractures of the posterior left eighth, ninth, and tenth ribs. 2. Small associated left pleural effusion. 3. Large hiatal hernia. 4. Cholelithiasis. 5. Coronary artery disease. Aortic Atherosclerosis (ICD10-I70.0). Electronically Signed   By: Delanna Ahmadi M.D.   On: 12/18/2020 14:24   CT Cervical Spine Wo Contrast  Result Date: 12/11/2020 CLINICAL DATA:  Trauma. EXAM: CT HEAD WITHOUT CONTRAST CT CERVICAL SPINE WITHOUT CONTRAST TECHNIQUE: Multidetector CT imaging of the head and cervical spine was performed following the standard protocol without intravenous contrast. Multiplanar CT image reconstructions of the cervical spine were also generated. COMPARISON:  None. FINDINGS: CT HEAD FINDINGS Brain: There is minimal right temporal subarachnoid hemorrhage (12/3, 33/5). No mass effect or midline shift. There is mild age-related atrophy and chronic microvascular ischemic changes. Forty Vascular: No hyperdense vessel or unexpected calcification. Skull: Normal. Negative for fracture or focal lesion. Sinuses/Orbits: No acute finding. Other: Left posterior parietal scalp laceration and hematoma. CT CERVICAL SPINE FINDINGS Alignment: No acute subluxation. There is straightening of normal cervical lordosis which may be positional or due to muscle spasm. Skull base and vertebrae: No acute fracture. Soft tissues and spinal canal: No prevertebral fluid or  swelling. No visible canal hematoma. Disc levels:  Multilevel degenerative changes Upper chest: Biapical subpleural scarring. Other: A 2 cm rim calcified right thyroid nodule present since 2005. Stability for greater than 5 years implies benignity; no biopsy or followup indicated (ref: J Am Coll Radiol. 2015 Feb;12(2): 143-50). IMPRESSION: 1. Minimal right temporal subarachnoid hemorrhage. No mass effect  or midline shift. 2. No acute/traumatic cervical spine pathology. These results were called by telephone at the time of interpretation on 12/11/2020 at 9:57 pm to provider [facility] , who verbally acknowledged these results. Electronically Signed   By: Anner Crete M.D.   On: 12/11/2020 22:03   MR BRAIN WO CONTRAST  Result Date: 12/15/2020 CLINICAL DATA:  Initial evaluation for recurrent dizziness. EXAM: MRI HEAD WITHOUT CONTRAST TECHNIQUE: Multiplanar, multiecho pulse sequences of the brain and surrounding structures were obtained without intravenous contrast. COMPARISON:  Prior CT from earlier same day as well as previous exams. FINDINGS: Brain: Cerebral volume within normal limits for age. Scattered patchy T2/FLAIR hyperintensity involving the periventricular and deep white matter both cerebral hemispheres, most consistent with chronic small vessel ischemic disease, moderate in nature. Again seen is a small subdural hematoma overlying the posterior right cerebral convexity (series 7, image 17). This measures up to 3 mm in maximal thickness. Extension along the right tentorium noted. No significant mass effect. Scattered FLAIR signal seen within several underlying cortical sulci, consistent with trace residual subarachnoid hemorrhage. Trace subarachnoid blood also noted at the posterior left temporal region. Trace intraventricular hemorrhage noted within the occipital horns of both lateral ventricles, likely related to redistribution (series 8, image 10). 13 mm focus of parenchymal FLAIR signal at  the peripheral right temporal lobe likely reflects an evolving nonhemorrhagic cortical contusion (series 7, image 9). No evidence for acute or subacute infarct. Gray-white matter differentiation otherwise maintained. No other areas of chronic cortical infarction. No other chronic blood products seen elsewhere within the brain. No mass lesion, midline shift, or mass effect. No hydrocephalus. Pituitary gland suprasellar region within normal limits. Midline structures intact. Vascular: Major intracranial vascular flow voids are maintained. Skull and upper cervical spine: Craniocervical junction within normal limits. Bone marrow signal intensity normal. Mild edema at the posterior scalp near the vertex, likely evolving contusion. Sinuses/Orbits: Globes normal soft tissues within normal limits. Mild mucosal thickening noted within the left sphenoid sinus. Paranasal sinuses are otherwise largely clear. No significant mastoid effusion. Other: None. IMPRESSION: 1. Small 3 mm subdural hematoma overlying the posterior right cerebral convexity with extension along the right tentorium, stable. No significant mass effect. 2. Trace residual subarachnoid hemorrhage, most pronounced at the right occipital region. Trace intraventricular hemorrhage likely related to redistribution. 3. 13 mm evolving nonhemorrhagic cortical contusion at the peripheral right temporal lobe. 4. No other acute intracranial abnormality.  No infarct. 5. Underlying moderate chronic microvascular ischemic disease. Electronically Signed   By: Jeannine Boga M.D.   On: 12/15/2020 01:28   ECHOCARDIOGRAM COMPLETE  Result Date: 12/12/2020    ECHOCARDIOGRAM REPORT   Patient Name:   KASLYN RICHBURG Date of Exam: 12/12/2020 Medical Rec #:  [id number]    Height:       63.0 in Accession #:    [id number]   Weight:       125.2 lb Date of Birth:  April 11, 1929   BSA:          1.585 m Patient Age:    69 years     BP:           117/63 mmHg Patient Gender: F             HR:           74 bpm. Exam Location:  Inpatient Procedure: 2D Echo, Cardiac Doppler and Color Doppler Indications:    Pericardial Effusion  History:        Patient has  no prior history of Echocardiogram examinations.                 Risk Factors:Hypertension.  Sonographer:    Bernadene Person RDCS Referring Phys: [id number] [place]  1. Left ventricular ejection fraction, by estimation, is 60 to 65%. The left ventricle has normal function. The left ventricle has no regional wall motion abnormalities. Left ventricular diastolic parameters are consistent with Grade I diastolic dysfunction (impaired relaxation).  2. Right ventricular systolic function is normal. The right ventricular size is normal. There is normal pulmonary artery systolic pressure.  3. The mitral valve is normal in structure. Mild mitral valve regurgitation. No evidence of mitral stenosis.  4. The aortic valve is tricuspid. Aortic valve regurgitation is mild. No aortic stenosis is present.  5. The inferior vena cava is normal in size with greater than 50% respiratory variability, suggesting right atrial pressure of 3 mmHg. Comparison(s): No prior Echocardiogram. FINDINGS  Left Ventricle: Left ventricular ejection fraction, by estimation, is 60 to 65%. The left ventricle has normal function. The left ventricle has no regional wall motion abnormalities. The left ventricular internal cavity size was normal in size. There is  no left ventricular hypertrophy. Left ventricular diastolic parameters are consistent with Grade I diastolic dysfunction (impaired relaxation). Right Ventricle: The right ventricular size is normal. Right ventricular systolic function is normal. There is normal pulmonary artery systolic pressure. The tricuspid regurgitant velocity is 2.63 m/s, and with an assumed right atrial pressure of 3 mmHg,  the estimated right ventricular systolic pressure is 97.4 mmHg. Left Atrium: Left atrial size was normal in size. Right  Atrium: Right atrial size was normal in size. Pericardium: There is no evidence of pericardial effusion. Mitral Valve: The mitral valve is normal in structure. Mild mitral valve regurgitation. No evidence of mitral valve stenosis. Tricuspid Valve: The tricuspid valve is normal in structure. Tricuspid valve regurgitation is mild . No evidence of tricuspid stenosis. Aortic Valve: The aortic valve is tricuspid. Aortic valve regurgitation is mild. Aortic regurgitation PHT measures 873 msec. No aortic stenosis is present. Pulmonic Valve: The pulmonic valve was normal in structure. Pulmonic valve regurgitation is trivial. No evidence of pulmonic stenosis. Aorta: The aortic root is normal in size and structure. Venous: The inferior vena cava is normal in size with greater than 50% respiratory variability, suggesting right atrial pressure of 3 mmHg. IAS/Shunts: No atrial level shunt detected by color flow Doppler.  LEFT VENTRICLE PLAX 2D LVIDd:         4.10 cm     Diastology LVIDs:         2.50 cm     LV e' medial:    5.29 cm/s LV PW:         0.80 cm     LV E/e' medial:  13.0 LV IVS:        0.90 cm     LV e' lateral:   6.80 cm/s LVOT diam:     2.00 cm     LV E/e' lateral: 10.1 LV SV:         75 LV SV Index:   47 LVOT Area:     3.14 cm  LV Volumes (MOD) LV vol d, MOD A2C: 45.0 ml LV vol d, MOD A4C: 76.8 ml LV vol s, MOD A2C: 24.3 ml LV vol s, MOD A4C: 29.0 ml LV SV MOD A2C:     20.7 ml LV SV MOD A4C:     76.8 ml LV  SV MOD BP:      35.8 ml RIGHT VENTRICLE RV S prime:     12.10 cm/s TAPSE (M-mode): 1.9 cm LEFT ATRIUM             Index        RIGHT ATRIUM           Index LA diam:        3.50 cm 2.21 cm/m   RA Area:     10.80 cm LA Vol (A2C):   22.8 ml 14.39 ml/m  RA Volume:   19.40 ml  12.24 ml/m LA Vol (A4C):   24.2 ml 15.27 ml/m LA Biplane Vol: 23.6 ml 14.89 ml/m  AORTIC VALVE LVOT Vmax:         109.00 cm/s LVOT Vmean:        70.300 cm/s LVOT VTI:          0.239 m AI PHT:            873 msec AR Vena Contracta: 0.30 cm   AORTA Ao Root diam: 3.30 cm Ao Asc diam:  3.60 cm MITRAL VALVE                TRICUSPID VALVE MV Area (PHT): 3.08 cm     TR Peak grad:   27.7 mmHg MV Decel Time: 246 msec     TR Vmax:        263.00 cm/s MV E velocity: 68.90 cm/s MV A velocity: 102.00 cm/s  SHUNTS MV E/A ratio:  0.68         Systemic VTI:  0.24 m                             Systemic Diam: 2.00 cm Kirk Ruths MD Electronically signed by Kirk Ruths MD Signature Date/Time: 12/12/2020/12:37:43 PM    Final     Labs:  Basic Metabolic Panel: Recent Labs  Lab 12/24/20 0638  NA 133*  K 3.8  CL 104  CO2 23  GLUCOSE 108*  BUN 23  CREATININE 0.90  CALCIUM 9.3    CBC: Recent Labs  Lab 12/24/20 0638  WBC 8.9  HGB 12.6  HCT 38.2  MCV 93.2  PLT 359    CBG: No results for input(s): GLUCAP in the last 168 hours.  Family history.  Positive for hypertension.  Negative for colon cancer stomach cancer or esophageal cancer  Brief HPI:   Cheryl Stafford is a 87 y.o. right-handed female with history of hypertension hypothyroidism iron deficiency anemia type 2 diabetes mellitus GERD.  Per chart review lives alone 1 level home 3 steps to entry.  Ambulates with a cane reports of 4 falls in the last 6 months.  She plans to discharge home with her daughter.  Presented 12/11/2020 after a fall while attempting to pick up a package which she fell backwards striking her head.  There was associated headache with nausea but no vomiting.  She denied loss of consciousness.  Cranial CT scan showed minimal right temporal subarachnoid hemorrhage.  No mass-effect or midline shift.  No acute traumatic cervical spine pathology.  Admission chemistries unremarkable except sodium 133 troponin negative urinalysis negative nitrite hemoglobin A1c 5.6.  Neurosurgery Dr. Venetia Constable consulted advised conservative care follow-up MRI 12/14/2020 showing small 3 mm subdural hematoma overlying the posterior right cerebral convexity with extension along the right tentorium  no significant mass-effect.  Trace residual subarachnoid hemorrhage most pronounced at the right occipital  region.  13 mm evolving nonhemorrhagic cortical contusion at the peripheral right temporal lobe.  No other acute intracranial abnormality.  Echocardiogram with ejection fraction of 60 to 65% no wall motion abnormalities grade 1 diastolic dysfunction.  Patient did have some complaints of left lateral chest wall tenderness felt to be musculoskeletal in nature x-rays rib series negative CTA chest did show posterior rib fracture advised conservative care.  Follow-up speech therapy for any signs of dysphagia patient with longstanding history of multiple esophageal issues scheduled to have outpatient esophageal examination.  Found to have no obvious oral issues or overt aspiration maintained on a regular diet.  Therapy evaluations completed due to patient decreased functional mobility was admitted for a comprehensive rehab program.   Hospital Course: Fatisha Rabalais was admitted to rehab 12/18/2020 for inpatient therapies to consist of PT, ST and OT at least three hours five days a week. Past admission physiatrist, therapy team and rehab RN have worked together to provide customized collaborative inpatient rehab.  Pertain to patient's traumatic SAH conservative care per neurosurgery.  SCDs for DVT prophylaxis.  Pain managed with use of scheduled Neurontin as well as Lidoderm patch tramadol as needed as well as Topamax for headaches.  Mood stabilization with Lexapro emotional support provided attending full therapies.  Hypothyroidism Synthroid ongoing.  Blood pressure controlled she did have some mild bradycardia Inderal was discontinued.  Restless leg syndrome maintained on Requip as well as Mirapex.  Bouts of constipation resolved with laxative assistance.  Protonix ongoing for GERD.   Blood pressures were monitored on TID basis and soft and monitored     Rehab course: During patient's stay in rehab weekly  team conferences were held to monitor patient's progress, set goals and discuss barriers to discharge. At admission, patient required minimal assist 20 feet rolling walker minimal assist supine to sit  Physical exam.  Blood pressure 110/62 pulse 52 temperature 97.8 respirations 17 oxygen saturations 99% room air Constitutional.  No acute distress HEENT Head.  Normocephalic and atraumatic Eyes.  Pupils round and reactive to light.  Nystagmus to right side only Neck.  Supple nontender no JVD without thyromegaly Cardiac regular rate rhythm not extra sounds or murmur heard Abdomen.  Soft nontender positive bowel sounds without rebound Respiratory effort normal no respiratory distress without wheeze Skin.  Warm and dry Musculoskeletal.  Normal range of motion Comments.  Upper lower extremities 5 -/5 tested deltoid bicep tricep wrist extension grip finger abduction as well as hip flexion knee extension knee flexion dorsi plantarflexion Neurologic.  Alert pleasant cooperative follows commands.  Provides name and age.  She was able to recall events of her fall.  He/She  has had improvement in activity tolerance, balance, postural control as well as ability to compensate for deficits. He/She has had improvement in functional use RUE/LUE  and RLE/LLE as well as improvement in awareness.  Amatory transfer to toilet rolling walker contact-guard.  Supervision PERI hygiene.  She could put on her sneakers total assist for time.  Completed hand hygiene standing at the sink contact-guard.  Ambulates 88 feet x 2 rolling walker contact-guard.  Focus on sit to stand and functional ambulation rolling walker in the room for ADLs Home environment contact-guard.  She did need some cues at times for recall and safety.  Full family teaching completed plan discharged to home       Disposition: Discharged to home   Diet: Carb modified  Special Instructions: No driving smoking or alcohol  Medications at  discharge  1.  Tylenol as needed 2.  Lexapro 20 mg p.o. daily 3.  Neurontin 300 mg p.o. nightly 4.  Synthroid 137 mcg p.o. daily 5.  Lidoderm patch change as directed 6.  Ativan 0.25 mg p.o. every 6 hours as needed anxiety 7.  Antivert 25 mg p.o. 3 times daily 8.  Protonix 40 mg p.o. daily 9.  MiraLAX twice daily hold for loose stools 10.  Mirapex 0.25 mg p.o. nightly 11.  Topamax 50 mg p.o. twice daily 12.  Tramadol 50 mg p.o. every 6 hours as needed pain  30-35 minutes were spent completing discharge summary and discharge planning  Discharge Instructions     Ambulatory referral to Physical Medicine Rehab   Complete by: As directed    Moderate complexity follow-up 1 to 2 weeks traumatic SAH        Follow-up Information     Raulkar, Clide Deutscher, MD Follow up.   Specialty: Physical Medicine and Rehabilitation Why: Office to call for appointment Contact information: [address] [place] [address] [phone]         Judith Part, MD Follow up.   Specialty: Neurosurgery Why: Call for appointment Contact information: [place] [address] [phone]                 Signed: Cathlyn Parsons 12/28/2020, 5:20 AM

## 2020-12-26 NOTE — Progress Notes (Signed)
Patient resting in bed. A&O x4, makes some needs known. Last BM 12/25/20. Meds whole with nectar, thin liquids. Bed locked & lowered, call bell within reach. Nurse will continue to monitor.

## 2020-12-26 NOTE — Progress Notes (Addendum)
Patient ID: Cheryl Stafford, female   DOB: 27-Apr-1929, 87 y.o.   MRN: [id number]  Spoke with Roel Cluck to schedule family education tomorrow from 10-12 in preparation for discharge Friday. Pt is going to son's home at discharge. Will let team know of plan  12:56 PM Spoke with Julie-daughter to update regarding team conference supervision-CGA level and discharge 12/23. Pt and daughter decided to have this worker order tub bench so will be ready to take home on Friday. Aware of the private pay. Son to be in tomorrow will see when here.Will alert [facility] regarding discharge since referral made while on acute

## 2020-12-26 NOTE — Progress Notes (Signed)
Physical Therapy Weekly Progress Note  Patient Details  Name: Cheryl Stafford MRN: [id number] Date of Birth: 03/08/1929  Beginning of progress report period: December 19, 2020 End of progress report period: December 26, 2020  Today's Date: 12/26/2020 PT Individual Time: 1300-1400 PT Individual Time Calculation (min): 60 min   Patient on track to meet all LTG prior to DC on Friday, 12/23. Overall, she requires supervision for bed mobility, stand<>pivot transfers, and ambulation up to 158f. She is able to navigate 12 steps with CGA and 2 hand rails. Have been problem solving her son's home entrance as it has 3-4 steps with no railings. Family education scheduled and planned prior to DC.  Continue POC.  Patient continues to demonstrate the following deficits muscle weakness, decreased cardiorespiratoy endurance, motor apraxia, decreased problem solving, decreased safety awareness, and decreased memory, and decreased standing balance and decreased balance strategies and therefore will continue to benefit from skilled PT intervention to increase functional independence with mobility.  Patient progressing toward long term goals..  Continue plan of care.  PT Short Term Goals Week 2:  PT Short Term Goal 1 (Week 2): STG = LTG  Skilled Therapeutic Interventions/Progress Updates:     Pt received sitting in w/c, agreeable to PT tx. Reports 5/10 rib fx pain. Provided rest breaks, distraction, and mobility for pain management. Focused session on increasing activity tolerance, gait training, stair training, and DC planning as pt set to DC to her son's home on Friday, 12/23. Pt transported to [address] tower bridge to work on fConservation officer, historic buildings Ambulated with RW >3069fwith supervision and RW while working on [employer] Storesask for conversing and DC planning. Noted decreased gait speed with dual-cog tasks but no overt LOB or signs of increased unsteadiness. Unsteadiness does increase with head turns or distractions  from external environment. Spent time discussing fall prevention at home and importance of task attenuation to reduce LOB. After seated rest break, ambulated an additional 10020fith supervision and RW and entered stair well on 5N tower bridge. Without rest break, she was able to navigate up/down x12, 6inch steps, using 1 hand rail on R with CGA for safety. Demonstrates step-to pattern for both ascent and descent, where she expresses some fear of falling. [facility] assisted onto Nustep with CGA stand<>pivot transfer with no AD. She completed 10 minutes with workload set to 6, using BUE/BLE - VC for increasing cadence/speed to carryover into functional tasks. She ambulated back to her room, ~59f64fith supervision and RW and was assisted back to bed at supervision assist. She did require assist for repositioning in bed to scoot towards HOB.[place] concluded session with bed alarm on, all immediate needs met.    Therapy Documentation Precautions:  Precautions Precautions: Fall Precaution Comments: History of falls, L rib pain, dizziness Restrictions Weight Bearing Restrictions: No RUE Weight Bearing: Weight bearing as tolerated LUE Weight Bearing: Weight bearing as tolerated RLE Weight Bearing: Weight bearing as tolerated LLE Weight Bearing: Weight bearing as tolerated General:    Therapy/Group: Individual Therapy  ChriAlger Simons21/2022, 7:37 AM

## 2020-12-26 NOTE — Progress Notes (Signed)
Occupational Therapy Session Note  Patient Details  Name: Cheryl Stafford MRN: [id number] Date of Birth: 04/17/1929  Today's Date: 12/26/2020 OT Individual Time: 0930-1010 OT Individual Time Calculation (min): 40 min    Short Term Goals: Week 1:  OT Short Term Goal 1 (Week 1): STGs=LTGs due to ELOS  Skilled Therapeutic Interventions/Progress Updates:    Pt resting in recliner upon arrival. Pt c/o Lt upper trap discomfort. 3 trigger points noted and myofascial release utilized for pain mgmt. Pt reported relief. Pt unable to recall earlier therapy. Pt provided schedule and able to recall earlier PT session but also thought that she had already seen therapist scheduled for later in the day. Focus on sit<>stand and functional amb with RW in room/home environment-CGA. No reports of dizziness. Pt remained in recliner with seat alarm activated. All needs within reach.   Therapy Documentation Precautions:  Precautions Precautions: Fall Precaution Comments: History of falls, L rib pain, dizziness Restrictions Weight Bearing Restrictions: No RUE Weight Bearing: Weight bearing as tolerated LUE Weight Bearing: Weight bearing as tolerated RLE Weight Bearing: Weight bearing as tolerated LLE Weight Bearing: Weight bearing as tolerated  Pain:  Pt c/o Lt upper trap discomfort (unrated); heat and soft tissue mobs  Therapy/Group: Individual Therapy  Leroy Libman 12/26/2020, 10:17 AM

## 2020-12-27 ENCOUNTER — Other Ambulatory Visit (HOSPITAL_COMMUNITY): Payer: Self-pay

## 2020-12-27 MED ORDER — LIDOCAINE 5 % EX PTCH
2.0000 | MEDICATED_PATCH | Freq: Every day | CUTANEOUS | 0 refills | Status: AC
  Filled 2020-12-27: qty 30, 15d supply, fill #0

## 2020-12-27 MED ORDER — LORAZEPAM 0.5 MG PO TABS
0.2500 mg | ORAL_TABLET | Freq: Four times a day (QID) | ORAL | 0 refills | Status: DC | PRN
  Filled 2020-12-27: qty 20, 10d supply, fill #0

## 2020-12-27 MED ORDER — ESCITALOPRAM OXALATE 20 MG PO TABS
20.0000 mg | ORAL_TABLET | Freq: Every day | ORAL | 0 refills | Status: AC
  Filled 2020-12-27: qty 30, 30d supply, fill #0

## 2020-12-27 MED ORDER — FERROUS SULFATE 325 (65 FE) MG PO TABS
324.0000 mg | ORAL_TABLET | Freq: Every day | ORAL | 0 refills | Status: AC
  Filled 2020-12-27: qty 30, 30d supply, fill #0

## 2020-12-27 MED ORDER — PHENOL 1.4 % MT LIQD
1.0000 | OROMUCOSAL | Status: DC | PRN
  Filled 2020-12-27: qty 177

## 2020-12-27 MED ORDER — TRAMADOL HCL 50 MG PO TABS
50.0000 mg | ORAL_TABLET | Freq: Four times a day (QID) | ORAL | 0 refills | Status: AC | PRN
  Filled 2020-12-27: qty 30, 7d supply, fill #0

## 2020-12-27 MED ORDER — LEVOTHYROXINE SODIUM 137 MCG PO TABS
137.0000 ug | ORAL_TABLET | Freq: Every day | ORAL | 0 refills | Status: AC
  Filled 2020-12-27: qty 30, 30d supply, fill #0

## 2020-12-27 MED ORDER — GABAPENTIN 300 MG PO CAPS
300.0000 mg | ORAL_CAPSULE | Freq: Every day | ORAL | 0 refills | Status: AC
  Filled 2020-12-27: qty 30, 30d supply, fill #0

## 2020-12-27 MED ORDER — TOPIRAMATE 50 MG PO TABS
50.0000 mg | ORAL_TABLET | Freq: Two times a day (BID) | ORAL | 0 refills | Status: DC
  Filled 2020-12-27: qty 60, 30d supply, fill #0

## 2020-12-27 MED ORDER — PANTOPRAZOLE SODIUM 40 MG PO TBEC
40.0000 mg | DELAYED_RELEASE_TABLET | Freq: Every day | ORAL | 0 refills | Status: AC
  Filled 2020-12-27: qty 30, 30d supply, fill #0

## 2020-12-27 MED ORDER — PRAMIPEXOLE DIHYDROCHLORIDE 0.25 MG PO TABS
0.2500 mg | ORAL_TABLET | Freq: Every day | ORAL | 0 refills | Status: DC
  Filled 2020-12-27: qty 30, 30d supply, fill #0

## 2020-12-27 MED ORDER — POLYETHYLENE GLYCOL 3350 17 G PO PACK: 17.0000 g | PACK | Freq: Two times a day (BID) | ORAL | 0 refills | Status: DC

## 2020-12-27 MED ORDER — MECLIZINE HCL 25 MG PO TABS
25.0000 mg | ORAL_TABLET | Freq: Three times a day (TID) | ORAL | 0 refills | Status: DC
  Filled 2020-12-27: qty 45, 15d supply, fill #0

## 2020-12-27 NOTE — Progress Notes (Signed)
Occupational Therapy Session Note  Patient Details  Name: Akeila Lana MRN: [id number] Date of Birth: 1929/11/29  Today's Date: 12/27/2020 OT Individual Time: 7494-4967 OT Individual Time Calculation (min): 38 min    Short Term Goals: Week 1:  OT Short Term Goal 1 (Week 1): STGs=LTGs due to ELOS  Skilled Therapeutic Interventions/Progress Updates:    Pt in wheelchair to start session, agreeable to OT.  She was able to transfer over to the bed with supervision for further vestibular testing to follow-up from [place] visit.  She transitioned to supine with supervision and no report of dizziness and not noted nystagmus.  Therapist completed repeated tests of the [facility] on the left side, which was positive last session, and no noted nystagmus was seen and no dizziness reported.  Therapist then tested the right ear as well which was also negative.  Transitioned back to supine for testing of horizontal canal with both the left and right negative.  She transitioned back to sitting with supervision and no report of dizziness.  BP taken in sitting as well at 128/78 and in standing at 112/68.  No lightheadedness noted with pt ambulating over to her wheelchair at supervision level without a device.  She was left with call button and phone in reach and safety alarm belt in place.    Therapy Documentation Precautions:  Precautions Precautions: Fall Precaution Comments: History of falls, L rib pain, dizziness Restrictions Weight Bearing Restrictions: No RUE Weight Bearing: Weight bearing as tolerated LUE Weight Bearing: Weight bearing as tolerated RLE Weight Bearing: Weight bearing as tolerated LLE Weight Bearing: Weight bearing as tolerated  Pain: Pain Assessment Pain Scale: 0-10 Pain Score: 5  ADL: See Care Tool Section for some details of mobility and selfcare    Therapy/Group: Individual Therapy  Ladena Jacquez OTR/L 12/27/2020, 3:38 PM

## 2020-12-27 NOTE — Progress Notes (Signed)
Inpatient Rehabilitation Care Coordinator Discharge Note   Patient Details  Name: Mozel Stafford MRN: [id number] Date of Birth: 08/25/1929   Discharge location: [place] WILL HAVE 24/7 SUPERVISION  Length of Stay:  10 DAYS  Discharge activity level: [place]  Home/community participation: ACTIVE  Patient response SJ:GGEZMO Literacy - How often do you need to have someone help you when you read instructions, pamphlets, or other written material from your doctor or pharmacy?: Never  Patient response QH:UTMLYY Isolation - How often do you feel lonely or isolated from those around you?: Never  Services provided included: MD, RD, PT, OT, RN, CM, Pharmacy, SW  Financial Services:  Financial Services Utilized: Medicare    Choices offered to/list presented to: PT AND SON  Follow-up services arranged:  Home Health, DME, Patient/Family request agency HH/DME [place]: [place]   DME : [facility] HH/DME Requested Agency: [place] LIST TO HIRE ADDITIONAL ASSIST IF NEEDED  Patient response to transportation need: Is the patient able to respond to transportation needs?: Yes In the past 12 months, has lack of transportation kept you from medical appointments or from getting medications?: No In the past 12 months, has lack of transportation kept you from meetings, work, or from getting things needed for daily living?: No    Comments (or additional information): MIKE WAS HERE FOR EDUCATION AND BOTH PT AND SON FEEL COMFORTABLE WITH DC HOME AND PT'S CARE NEEDS. PT HOPES TO EVENTUALLY GO BACK TO HER HOME.  Patient/Family verbalized understanding of follow-up arrangements:  Yes  Individual responsible for coordination of the follow-up plan: MIKE-SON [phone]  Confirmed correct DME delivered: Elease Hashimoto 12/27/2020    Izaan Kingbird, Gardiner Rhyme

## 2020-12-27 NOTE — Progress Notes (Signed)
Occupational Therapy Discharge Summary  Patient Details  Name: Cheryl Stafford MRN: [id number] Date of Birth: 12/22/1929  Today's Date: 12/27/2020 OT Individual Time: 1010-1105 OT Individual Time Calculation (min): 55 min    Patient has met 6 of 7 long term goals due to improved activity tolerance, improved balance, postural control, ability to compensate for deficits, functional use of  LEFT upper extremity, improved attention, improved awareness, and improved coordination.  Patient to discharge at overall Supervision level.  Patient's care partner is independent to provide the necessary cognitive assistance at discharge.    Reasons goals not met: Pt requires intermittent min assist with toileting secondary to incontinent episodes without awareness of occurrence occasionally.  Otherwise pt is setup with toileting. Goal partially met.  Recommendation:  Patient will benefit from ongoing skilled OT services in home health setting to continue to advance functional skills in the area of BADL and iADL. Pt would benefit from SPT evaluation and treatment for cognition due to decline from baseline per pts son report this day. Pt may also benefit from further vestibular treatment due to dizziness persisting.    Equipment: Tub transfer bench  Reasons for discharge: treatment goals met and discharge from hospital  Patient/family agrees with progress made and goals achieved: Yes  Skilled Intervention: Pt sitting up in w/c, son present for caregiver education.  Pts son reporting he is willing to provide 24 hour supervision however may need to leave for short periods of time intermittently.  Educated son on need for setting pt up with all needs and making sure toileting has been completed prior to leaving pt alone.  Also recommended pt have phone on self in case of emergency.  Educated son on pts current functional status including needing min assist intermittently secondary to urinary and bowel incontinence  with impaired pt awareness.  Pts son reports he will do what he has to do to take care of pt however reported he will ask his wife to help pt during bathing tasks due to discomfort with this.  Pts son demonstrated good carryover of supervising pt during toilet transfer and setup standing at sink to brush her teeth.  Pt needed intermittent Vcs for problem solving, safe RW mgt, and memory and son provided appropriately.  Pt reporting 4/10 pain in left ribcage and head as well as increased dizziness this am. Therapist re-educated pt on available as needed pain medication. Pt then reports yes she does want pain medication.  Made nurse aware of pts request for as needed pain medication. Call bell in reach at end of session.  OT Discharge Precautions/Restrictions  Precautions Precautions: Fall Precaution Comments: History of falls, L rib pain, dizziness Restrictions Weight Bearing Restrictions: No General   Pain Pain Assessment Pain Scale: 0-10 Pain Score: 5  ADL ADL Eating: Independent Where Assessed-Eating: Chair Grooming: Setup Where Assessed-Grooming: Sitting at sink Upper Body Bathing: Setup Where Assessed-Upper Body Bathing: Shower Lower Body Bathing: Supervision/safety Where Assessed-Lower Body Bathing: Shower Upper Body Dressing: Setup Where Assessed-Upper Body Dressing: Chair Lower Body Dressing: Supervision/safety Where Assessed-Lower Body Dressing: Chair Toileting: Minimal assistance, Minimal cueing Where Assessed-Toileting: Bedside Commode Toilet Transfer: Close supervision Toilet Transfer Method: Counselling psychologist: Bedside commode Tub/Shower Transfer: Close supervison Clinical cytogeneticist Method: Optometrist: Radio broadcast assistant Vision Baseline Vision/History: 1 Wears glasses Patient Visual Report: No change from baseline Vision Assessment?: No apparent visual deficits Perception  Perception: Within Functional  Limits Praxis Praxis: Intact Cognition Overall Cognitive Status: Within Functional Limits for tasks assessed Arousal/Alertness:  Awake/alert Orientation Level: Oriented X4 Year: 2022 Month: December Day of Week: Correct Attention: Focused;Sustained Focused Attention: Appears intact Sustained Attention: Appears intact Memory: Impaired Immediate Memory Recall: Sock;Blue;Bed Memory Recall Sock: Without Cue Memory Recall Blue: Without Cue Memory Recall Bed: Without Cue Awareness: Impaired Awareness Impairment: Emergent impairment Problem Solving: Impaired Safety/Judgment: Impaired Sensation Sensation Light Touch: Appears Intact Hot/Cold: Appears Intact Proprioception: Appears Intact Stereognosis: Appears Intact Coordination Gross Motor Movements are Fluid and Coordinated: No Fine Motor Movements are Fluid and Coordinated: No Coordination and Movement Description: Resting tremor in hands. Pain limiting gross movement patterns Finger Nose Finger Test: mild tremor (baseline) Motor  Motor Motor: Other (comment) Motor - Discharge Observations: Generalized weakness and deconditioning with associated dizziness and rib pain. Much improved since date of evaluation but remains ongoing. Mobility  Bed Mobility Bed Mobility: Rolling Right;Rolling Left;Supine to Sit;Sit to Supine Rolling Right: Independent Rolling Left: Independent Supine to Sit: Independent with assistive device Sit to Supine: Independent with assistive device Transfers Sit to Stand: Independent with assistive device Stand to Sit: Independent with assistive device  Trunk/Postural Assessment  Cervical Assessment Cervical Assessment: Exceptions to [facility] (forward head) Thoracic Assessment Thoracic Assessment: Exceptions to [facility] (rounded shoulders) Lumbar Assessment Lumbar Assessment: Exceptions to [facility] (posterior pelvic tilt) Postural Control Postural Control: Within Functional Limits  Balance Balance Balance  Assessed: Yes Static Sitting Balance Static Sitting - Balance Support: No upper extremity supported;Feet supported Static Sitting - Level of Assistance: 6: Modified independent (Device/Increase time) Dynamic Sitting Balance Dynamic Sitting - Balance Support: Feet supported;During functional activity Dynamic Sitting - Level of Assistance: 5: Stand by assistance Static Standing Balance Static Standing - Balance Support: During functional activity;Left upper extremity supported Static Standing - Level of Assistance: 5: Stand by assistance Dynamic Standing Balance Dynamic Standing - Balance Support: Left upper extremity supported;During functional activity Dynamic Standing - Level of Assistance: 5: Stand by assistance Extremity/Trunk Assessment RUE Assessment RUE Assessment: Exceptions to [facility] General Strength Comments: 4-/5     Ezekiel Slocumb 12/27/2020, 12:58 PM

## 2020-12-27 NOTE — Progress Notes (Signed)
Patient ID: Cheryl Stafford, female   DOB: 11-20-1929, 87 y.o.   MRN: [id number]  Martin Majestic to see pt and son who had left by the time worker got up to her room he had left. Asked how it went pt felt it went well. Have left a private duty list in her room in case son wants to hire assist. Pt feels ready to go home tomorrow.

## 2020-12-27 NOTE — Progress Notes (Signed)
Physical Therapy Session Note  Patient Details  Name: Cheryl Stafford MRN: [id number] Date of Birth: Jun 25, 1929  Today's Date: 12/27/2020 PT Individual Time: 1100-1200 + 1345-1500 PT Individual Time Calculation (min): 60 min + 75 min  Short Term Goals: Week 2:  PT Short Term Goal 1 (Week 2): STG = LTG  Skilled Therapeutic Interventions/Progress Updates:      1st session: Pt greeted seated in w/c with son at bedside. Son present for family education/training. Pt reports some L sided rib fx pain, rest breaks and mobility provided for pain management. Lengthy discussion at start of session regarding PT goals, pt's current mobility status, fall prevention strategies at home, home safety management, RW at all times. Son voiced understand of all precautions and both pt and son voiced readiness for DC home tomorrow. Pt transported downstairs to 67[facility] rehab gym for time. Worked on stair training to start and reviewed home entrance with son as his house is 4 STE with no rails. Pt completed: -8, 6inch steps with 2 hand rails with CGA -4, 6inch steps with 1 hand rails with CGA -4, 6inch steps with no rails with bilateral HHA with minA provided from PT --4, 6inch steps with no rails with bilateral HHA with minA provided from son with PT providing close supervision for safety. *seated rest breaks b/w bouts  Son demonstrating appropriate safety and assist level for stair negotiation.  Reviewed car transfers with son and pt with car height simulated CR-V. Car transfer completed at supervision level with son actively observing. Completed session with longer distance gait training with son providing CGA/supervision assist as pt ambulated >109f with RW. Pt concluded session seated in w/c with all needs met, son at bedside, no further questions regarding DC home.    2nd session: Pt sitting in w/c to start session with NT taking vitals - WNL. Pt agreeable to PT tx. Reports 4/10 rib pain. Treatment to tolerance  with rest breaks and mobility provided for pain management. Pt transported downstairs to [facility] rehab gym for time.   Completed BERG balance test with results outlined below.  Patient demonstrates increased fall risk as noted by score of 36/56 on Berg Balance Scale.  (<36= high risk for falls, close to 100%; 37-45 significant >80%; 46-51 moderate >50%; 52-55 lower >25%)  Completed 5xSTS unsupported from low mat table: 37 seconds *Scores > 13.5 seconds indicate increased falls risk. Pt reports pain is limiting ability..  Completed TUG with RW and supervision: TUG: Supervision assist using RW. 47 seconds, 43 seconds, 35 seconds. Avg = 41.67 seconds *Scores > 13.5 seconds indicate increased falls risk.   Completed 6MWT with RW and supervision - ambulating 4934f Age norm for females = 128736f [facility] Assisted onto Nustep where she completed 6 minutes + 2 minutes (rest break) at workload 6, using BUE/BLE - VC for increasing cadence but typically kept around ~20steps/minute.   Ambulated back to her room, ~40f34fith supervision and RW and completed bed mobility mod I. Remained in bed with bed alarm on and all needs in reach.  Therapy Documentation Precautions:  Precautions Precautions: Fall Precaution Comments: History of falls, L rib pain, dizziness Restrictions Weight Bearing Restrictions: No RUE Weight Bearing: Weight bearing as tolerated LUE Weight Bearing: Weight bearing as tolerated RLE Weight Bearing: Weight bearing as tolerated LLE Weight Bearing: Weight bearing as tolerated General:    Balance Balance Assessed: Yes Standardized Balance Assessment Standardized Balance Assessment: Berg Balance Test Berg Balance Test Sit to  Stand: Able to stand without using hands and stabilize independently Standing Unsupported: Able to stand safely 2 minutes Sitting with Back Unsupported but Feet Supported on Floor or Stool: Able to sit safely and securely 2  minutes Stand to Sit: Sits safely with minimal use of hands Transfers: Able to transfer safely, definite need of hands Standing Unsupported with Eyes Closed: Able to stand 10 seconds safely Standing Ubsupported with Feet Together: Able to place feet together independently and stand for 1 minute with supervision From Standing, Reach Forward with Outstretched Arm: Can reach forward >5 cm safely (2") From Standing Position, Pick up Object from Floor: Able to pick up shoe, needs supervision From Standing Position, Turn to Look Behind Over each Shoulder: Looks behind one side only/other side shows less weight shift (scoliosis limiting ability to turn R) Turn 360 Degrees: Needs close supervision or verbal cueing Standing Unsupported, Alternately Place Feet on Step/Stool: Able to complete >2 steps/needs minimal assist Standing Unsupported, One Foot in Front: Loses balance while stepping or standing Standing on One Leg: Unable to try or needs assist to prevent fall Total Score: 36/56  Patient demonstrates increased fall risk as noted by score of  36/56 on Berg Balance Scale.  (<36= high risk for falls, close to 100%; 37-45 significant >80%; 46-51 moderate >50%; 52-55 lower >25%)  Therapy/Group: Individual Therapy  Jensen Cheramie P Lister Brizzi PT 12/27/2020, 7:34 AM

## 2020-12-27 NOTE — Discharge Summary (Signed)
Physical Therapy Discharge Summary  Patient Details  Name: Cheryl Stafford MRN: [id number] Date of Birth: 07-Feb-1929  Patient has met 8 of 8 long term goals due to improved activity tolerance, improved balance, improved postural control, increased strength, ability to compensate for deficits, improved attention, improved awareness, and improved coordination.  Patient to discharge at an ambulatory level Supervision.   Patient's care partner is independent to provide the necessary physical and cognitive assistance at discharge.  Reasons goals not met: n/a  Recommendation:  Patient will benefit from ongoing skilled PT services in home health setting to continue to advance safe functional mobility, address ongoing impairments in balance, gait, strength, and functional mobility, and minimize fall risk.  Equipment: Pt owns RW  Reasons for discharge: treatment goals met and discharge from hospital  Patient/family agrees with progress made and goals achieved: Yes  PT Discharge Precautions/Restrictions Precautions Precautions: Fall Precaution Comments: History of falls, L rib pain, dizziness Restrictions Weight Bearing Restrictions: No Pain Interference Pain Interference Pain Effect on Sleep: 1. Rarely or not at all Pain Interference with Therapy Activities: 4. Almost constantly Pain Interference with Day-to-Day Activities: 4. Almost constantly Vision/Perception  Vision - History Ability to See in Adequate Light: 0 Adequate Perception Perception: Within Functional Limits Praxis Praxis: Intact  Cognition Overall Cognitive Status: Within Functional Limits for tasks assessed Arousal/Alertness: Awake/alert Orientation Level: Oriented X4 Focused Attention: Appears intact Sustained Attention: Appears intact Memory: Impaired Memory Impairment: Decreased recall of new information;Decreased short term memory Decreased Short Term Memory: Verbal basic;Verbal complex Awareness: Appears  intact Problem Solving: Impaired Problem Solving Impairment: Functional complex;Verbal complex Safety/Judgment: Appears intact Sensation Sensation Light Touch: Appears Intact Hot/Cold: Appears Intact Proprioception: Appears Intact Stereognosis: Appears Intact Coordination Gross Motor Movements are Fluid and Coordinated: No Fine Motor Movements are Fluid and Coordinated: No Coordination and Movement Description: Resting tremor in hands. Pain limiting gross movement patterns Heel Shin Test: WNL Motor  Motor Motor: Other (comment) Motor - Discharge Observations: Generalized weakness and deconditioning with associated dizziness and rib pain. Much improved since date of evaluation but remains ongoing.  Mobility Bed Mobility Bed Mobility: Rolling Right;Rolling Left;Supine to Sit;Sit to Supine Rolling Right: Independent Rolling Left: Independent Supine to Sit: Independent with assistive device Sit to Supine: Independent with assistive device Transfers Transfers: Sit to Stand;Stand to Sit;Stand Pivot Transfers Sit to Stand: Independent with assistive device Stand to Sit: Independent with assistive device Stand Pivot Transfers: Supervision/Verbal cueing Stand Pivot Transfer Details: Verbal cues for precautions/safety;Verbal cues for safe use of DME/AE Transfer (Assistive device): Rolling walker Locomotion  Gait Ambulation: Yes Gait Assistance: Supervision/Verbal cueing Gait Distance (Feet): 250 Feet Assistive device: Rolling walker Gait Assistance Details: Verbal cues for precautions/safety;Verbal cues for gait pattern;Verbal cues for safe use of DME/AE Gait Gait: Yes Gait Pattern: Impaired Gait Pattern: Step-through pattern;Lateral trunk lean to right;Narrow base of support;Shuffle;Trunk flexed (Scoliosis with R shift) Gait velocity: decr Stairs / Additional Locomotion Stairs: Yes Stairs Assistance: Contact Guard/Touching assist Stair Management Technique: One rail  Right Number of Stairs: 12 Height of Stairs: 6 Wheelchair Mobility Wheelchair Mobility: No  Trunk/Postural Assessment  Cervical Assessment Cervical Assessment: Exceptions to [facility] (forward head) Thoracic Assessment Thoracic Assessment: Exceptions to [facility] (rounded shoulders, kyphosis, and premorbid scoliosis with R shift (she see's a spine MD)) Lumbar Assessment Lumbar Assessment: Exceptions to [facility] (posterior pelvic tilt) Postural Control Postural Control: Within Functional Limits  Balance Balance Balance Assessed: Yes Standardized Balance Assessment Standardized Balance Assessment: Berg Balance Test Berg Balance Test Sit to Stand: Able to  stand without using hands and stabilize independently Standing Unsupported: Able to stand safely 2 minutes Sitting with Back Unsupported but Feet Supported on Floor or Stool: Able to sit safely and securely 2 minutes Stand to Sit: Sits safely with minimal use of hands Transfers: Able to transfer safely, definite need of hands Standing Unsupported with Eyes Closed: Able to stand 10 seconds safely Standing Ubsupported with Feet Together: Able to place feet together independently and stand for 1 minute with supervision From Standing, Reach Forward with Outstretched Arm: Can reach forward >5 cm safely (2") From Standing Position, Pick up Object from Floor: Able to pick up shoe, needs supervision From Standing Position, Turn to Look Behind Over each Shoulder: Looks behind one side only/other side shows less weight shift (scoliosis limiting ability to turn R) Turn 360 Degrees: Needs close supervision or verbal cueing Standing Unsupported, Alternately Place Feet on Step/Stool: Able to complete >2 steps/needs minimal assist Standing Unsupported, One Foot in Front: Loses balance while stepping or standing Standing on One Leg: Unable to try or needs assist to prevent fall Total Score: 36/56 Static Sitting Balance Static Sitting - Balance Support: No upper  extremity supported;Feet supported Static Sitting - Level of Assistance: 6: Modified independent (Device/Increase time) Dynamic Sitting Balance Dynamic Sitting - Balance Support: Feet supported;During functional activity Dynamic Sitting - Level of Assistance: 5: Stand by assistance Static Standing Balance Static Standing - Balance Support: During functional activity;Left upper extremity supported Static Standing - Level of Assistance: 5: Stand by assistance Dynamic Standing Balance Dynamic Standing - Balance Support: Left upper extremity supported;During functional activity Dynamic Standing - Level of Assistance: 5: Stand by assistance  TUG: Supervision assist using RW. 47 seconds, 43 seconds, 35 seconds. Avg = 41.67 seconds *Scores > 13.5 seconds indicate increased falls risk.  Extremity Assessment      RLE Assessment RLE Assessment: Exceptions to [facility] Strength Comments: Grossly 4/5 LLE Assessment LLE Assessment: Exceptions to [facility] General Strength Comments: Grossly 4/5    Armiyah Capron P Nevan Creighton PT, DPT 12/27/2020, 7:46 AM

## 2020-12-28 DIAGNOSIS — K5901 Slow transit constipation: Secondary | ICD-10-CM

## 2020-12-28 DIAGNOSIS — S066X1S Traumatic subarachnoid hemorrhage with loss of consciousness of 30 minutes or less, sequela: Secondary | ICD-10-CM

## 2020-12-28 DIAGNOSIS — I10 Essential (primary) hypertension: Secondary | ICD-10-CM

## 2020-12-28 NOTE — Progress Notes (Signed)
Recreational Therapy Discharge Summary Patient Details  Name: Cheryl Stafford MRN: [id number] Date of Birth: [birth year] Today's Date: 12/28/2020  Long term goals set: 1  Long term goals met: 1  Comments on progress toward goals: Pt has made good progress during LOS and is excited for discharge today.  Outside of evaluation, pt did not participate in scheduled TR sessions, however pt observed to be supervision level with simple tasks seated.  Pt with intermittent HA and dizziness during LOS, being addressed by team.  Education included importance of leisure, activity analysis/modifications and coping strategies.  Pt is hopeful to return home alone in the future.  Pt is also hopeful to return to previously enjoyed activities and her bookkeeping job at [employer].  Reasons goals not met: n/a  Equipment acquired: n/a  Reasons for discharge: discharge from hospital  Patient/family agrees with progress made and goals achieved: Yes  Cheryl Stafford 12/28/2020, 1:09 PM

## 2020-12-28 NOTE — Progress Notes (Signed)
PROGRESS NOTE   Subjective/Complaints: Throat feels better. Happy to be going home today!  ROS: Patient denies fever, rash, sore throat, blurred vision, nausea, vomiting, diarrhea, cough, shortness of breath or chest pain, joint or back pain, headache, or mood change.   Objective:   No results found. No results for input(s): WBC, HGB, HCT, PLT in the last 72 hours. No results for input(s): NA, K, CL, CO2, GLUCOSE, BUN, CREATININE, CALCIUM in the last 72 hours.  Intake/Output Summary (Last 24 hours) at 12/28/2020 1041 Last data filed at 12/28/2020 0900 Gross per 24 hour  Intake 660 ml  Output --  Net 660 ml        Physical Exam: Vital Signs Blood pressure (!) 143/76, pulse 75, temperature 98.4 F (36.9 C), resp. rate 16, height 5\' 3"  (1.6 m), weight 55.6 kg, SpO2 95 %. Constitutional: No distress . Vital signs reviewed. HEENT: NCAT, EOMI, oral membranes moist, no thrush Neck: supple Cardiovascular: RRR without murmur. No JVD    Respiratory/Chest: CTA Bilaterally without wheezes or rales. Normal effort    GI/Abdomen: BS +, non-tender, non-distended Ext: no clubbing, cyanosis, or edema Psych: pleasant and cooperative  Musculoskeletal:     Cervical back: Normal range of motion. + TTP over anterior L lower ribs and chronic TTP to middle/lumbar back    Comments: UE and LE strength 5-/5; tested Deltoid, biceps, triceps, WE< ,grip and finger abdction; as well as HF, KE, KF DF and PF  Skin:    skin intact Neurological:     Comments: Patient is alert pleasant and cooperative.  Follows simple commands.  Makes eye contact with examiner.  Provides name and age.  reasonable insight and awareness. No nystagmus appreciated Intact to light touch in all 4 extremities B/L        Assessment/Plan: 1. Functional deficits which require 3+ hours per day of interdisciplinary therapy in a comprehensive inpatient rehab  setting. Physiatrist is providing close team supervision and 24 hour management of active medical problems listed below. Physiatrist and rehab team continue to assess barriers to discharge/monitor patient progress toward functional and medical goals  Care Tool:  Bathing    Body parts bathed by patient: Right arm, Chest, Left arm, Abdomen, Front perineal area, Right upper leg, Left upper leg, Right lower leg, Left lower leg, Face   Body parts bathed by helper: Buttocks     Bathing assist Assist Level: Supervision/Verbal cueing     Upper Body Dressing/Undressing Upper body dressing   What is the patient wearing?: Pull over shirt    Upper body assist Assist Level: Set up assist    Lower Body Dressing/Undressing Lower body dressing      What is the patient wearing?: Pants, Underwear/pull up     Lower body assist Assist for lower body dressing: Supervision/Verbal cueing     Toileting Toileting    Toileting assist Assist for toileting: Minimal Assistance - Patient > 75%     Transfers Chair/bed transfer  Transfers assist     Chair/bed transfer assist level: Supervision/Verbal cueing     Locomotion Ambulation   Ambulation assist      Assist level: Supervision/Verbal cueing Assistive device: Walker-rolling Max  distance: 255ft   Walk 10 feet activity   Assist     Assist level: Supervision/Verbal cueing Assistive device: Walker-rolling   Walk 50 feet activity   Assist Walk 50 feet with 2 turns activity did not occur: Safety/medical concerns (rib pain and dizziness)  Assist level: Supervision/Verbal cueing Assistive device: Walker-rolling    Walk 150 feet activity   Assist Walk 150 feet activity did not occur: Safety/medical concerns  Assist level: Supervision/Verbal cueing Assistive device: Walker-rolling    Walk 10 feet on uneven surface  activity   Assist Walk 10 feet on uneven surfaces activity did not occur: Safety/medical  concerns   Assist level: Contact Guard/Touching assist Assistive device: Walker-rolling   Wheelchair     Assist Is the patient using a wheelchair?: No Type of Wheelchair: Manual    Wheelchair assist level: Dependent - Patient 0% Max wheelchair distance: 52ft    Wheelchair 50 feet with 2 turns activity    Assist        Assist Level: Dependent - Patient 0% (per PT documentation)   Wheelchair 150 feet activity     Assist      Assist Level: Dependent - Patient 0% (per PT documentation)   Blood pressure (!) 143/76, pulse 75, temperature 98.4 F (36.9 C), resp. rate 16, height 5\' 3"  (1.6 m), weight 55.6 kg, SpO2 95 %.  Medical Problem List and Plan: 1. Functional deficits with decreased in gait and dizziness secondary to traumatic SAH, RIght SDH- left neglect              -dc home today  -f/u with [facility], primary, NS  2.  Antithrombotics: -DVT/anticoagulation:  Mechanical: Antiembolism stockings, thigh (TED hose) Bilateral lower extremities             -antiplatelet therapy: N/A 3. Pain Management: Neurontin 300 mg nightly, Lidoderm patch as directed adjusted order , tramadol as needed  12/23 pain controlled.  4. Mood: Lexapro 20 mg daily             -antipsychotic agents: N/A 5. Neuropsych: This patient is capable of making decisions on her own behalf. 6. Skin/Wound Care: Routine skin checks 7. Fluids/Electrolytes/Nutrition: Routine in and outs with follow-up chemistries 8.  Hypothyroidism.  Synthroid 9.  Hypertension.  Inderal 10 mg twice daily- stopped due to bradycardia  Vitals:   12/27/20 1930 12/28/20 0526  BP: 115/64 (!) 143/76  Pulse: 81 75  Resp: 16 16  Temp: 98.4 F (36.9 C) 98.4 F (36.9 C)  SpO2: 91% 95%   12/23 bp in normal range 10.  Restless leg syndrome.  Continue Requip/Mirapex 0.25 mg nightly 11. Constipation- LBM 1 week ago- had miralax x2 days- if no BM, needs Sorbitol.  12/23 moving bowels regularly, two today 12. Vertigo:  increase meclizine to 25mg  TID. Vestibular eval and treatment. Avoid looking to the left 13. Headache: increase Topamax to 50 mg BID.  12/23 improving.  con't regimen 14. Decreased appetite: discussed that this is normal post- SAH. May be related to constipation as well.   12/23--eating 75-100 percent .       LOS: 10 days A FACE TO FACE EVALUATION WAS PERFORMED  Meredith Staggers 12/28/2020, 10:41 AM

## 2021-01-02 ENCOUNTER — Telehealth: Payer: Self-pay

## 2021-01-02 NOTE — Telephone Encounter (Signed)
Transitional Care Call--who you spoke with Jarrett Ables (Son)   Are you/is patient experiencing any problems since coming home? None.  Are there any questions regarding any aspect of care? None. Are there any questions regarding medications administration/dosing? No. Are meds being taken as prescribed? Yes. Patient should review meds with caller to confirm. Confirmed (not taking the Lidoderm pain patch).  Have there been any falls? No.  Has Home Health been to the house and/or have they contacted you?Yes.  If not, have you tried to contact them? N/A Can we help you contact them? No need. Are bowels and bladder emptying properly? Yes. Are there any unexpected incontinence issues?None. If applicable, is patient following bowel/bladder programs?N/A. Any fevers, problems with breathing, unexpected pain?No problem. Are there any skin problems or new areas of breakdown? None Has the patient/family member arranged specialty MD follow up (ie cardiology/neurology/renal/surgical/etc)? Yes.  Can we help arrange? Neurosurgery phone number given to call for an appointment follow up.  Does the patient need any other services or support that we can help arrange? No.  Are caregivers following through as expected in assisting the patient?Yes.        11. Has the patient quit smoking, drinking alcohol, or using drugs as recommended? Patient does not indulge.   Appointment Date/Time/ Arrival time/ and who they are seeing Dr, Ranell Patrick on 01/21/2021 at 11:20 AM. [place]

## 2021-01-03 DIAGNOSIS — M419 Scoliosis, unspecified: Secondary | ICD-10-CM | POA: Diagnosis not present

## 2021-01-03 DIAGNOSIS — Z602 Problems related to living alone: Secondary | ICD-10-CM | POA: Diagnosis not present

## 2021-01-03 DIAGNOSIS — M859 Disorder of bone density and structure, unspecified: Secondary | ICD-10-CM | POA: Diagnosis not present

## 2021-01-03 DIAGNOSIS — I700 Atherosclerosis of aorta: Secondary | ICD-10-CM | POA: Diagnosis not present

## 2021-01-03 DIAGNOSIS — D509 Iron deficiency anemia, unspecified: Secondary | ICD-10-CM | POA: Diagnosis not present

## 2021-01-03 DIAGNOSIS — K219 Gastro-esophageal reflux disease without esophagitis: Secondary | ICD-10-CM | POA: Diagnosis not present

## 2021-01-03 DIAGNOSIS — S066X0D Traumatic subarachnoid hemorrhage without loss of consciousness, subsequent encounter: Secondary | ICD-10-CM | POA: Diagnosis not present

## 2021-01-03 DIAGNOSIS — E039 Hypothyroidism, unspecified: Secondary | ICD-10-CM | POA: Diagnosis not present

## 2021-01-03 DIAGNOSIS — E119 Type 2 diabetes mellitus without complications: Secondary | ICD-10-CM | POA: Diagnosis not present

## 2021-01-03 DIAGNOSIS — K8020 Calculus of gallbladder without cholecystitis without obstruction: Secondary | ICD-10-CM | POA: Diagnosis not present

## 2021-01-03 DIAGNOSIS — I2510 Atherosclerotic heart disease of native coronary artery without angina pectoris: Secondary | ICD-10-CM | POA: Diagnosis not present

## 2021-01-03 DIAGNOSIS — I10 Essential (primary) hypertension: Secondary | ICD-10-CM | POA: Diagnosis not present

## 2021-01-03 DIAGNOSIS — K449 Diaphragmatic hernia without obstruction or gangrene: Secondary | ICD-10-CM | POA: Diagnosis not present

## 2021-01-03 DIAGNOSIS — S2242XD Multiple fractures of ribs, left side, subsequent encounter for fracture with routine healing: Secondary | ICD-10-CM | POA: Diagnosis not present

## 2021-01-03 DIAGNOSIS — G2581 Restless legs syndrome: Secondary | ICD-10-CM | POA: Diagnosis not present

## 2021-01-03 DIAGNOSIS — Z9181 History of falling: Secondary | ICD-10-CM | POA: Diagnosis not present

## 2021-01-03 DIAGNOSIS — K5900 Constipation, unspecified: Secondary | ICD-10-CM | POA: Diagnosis not present

## 2021-01-08 DIAGNOSIS — E039 Hypothyroidism, unspecified: Secondary | ICD-10-CM | POA: Diagnosis not present

## 2021-01-08 DIAGNOSIS — I10 Essential (primary) hypertension: Secondary | ICD-10-CM | POA: Diagnosis not present

## 2021-01-08 DIAGNOSIS — D509 Iron deficiency anemia, unspecified: Secondary | ICD-10-CM | POA: Diagnosis not present

## 2021-01-08 DIAGNOSIS — G2581 Restless legs syndrome: Secondary | ICD-10-CM | POA: Diagnosis not present

## 2021-01-08 DIAGNOSIS — S066X0D Traumatic subarachnoid hemorrhage without loss of consciousness, subsequent encounter: Secondary | ICD-10-CM | POA: Diagnosis not present

## 2021-01-08 DIAGNOSIS — S2242XD Multiple fractures of ribs, left side, subsequent encounter for fracture with routine healing: Secondary | ICD-10-CM | POA: Diagnosis not present

## 2021-01-10 DIAGNOSIS — D509 Iron deficiency anemia, unspecified: Secondary | ICD-10-CM | POA: Diagnosis not present

## 2021-01-10 DIAGNOSIS — S066X0D Traumatic subarachnoid hemorrhage without loss of consciousness, subsequent encounter: Secondary | ICD-10-CM | POA: Diagnosis not present

## 2021-01-10 DIAGNOSIS — E039 Hypothyroidism, unspecified: Secondary | ICD-10-CM | POA: Diagnosis not present

## 2021-01-10 DIAGNOSIS — G2581 Restless legs syndrome: Secondary | ICD-10-CM | POA: Diagnosis not present

## 2021-01-10 DIAGNOSIS — S2242XD Multiple fractures of ribs, left side, subsequent encounter for fracture with routine healing: Secondary | ICD-10-CM | POA: Diagnosis not present

## 2021-01-10 DIAGNOSIS — I10 Essential (primary) hypertension: Secondary | ICD-10-CM | POA: Diagnosis not present

## 2021-01-14 DIAGNOSIS — E039 Hypothyroidism, unspecified: Secondary | ICD-10-CM | POA: Diagnosis not present

## 2021-01-14 DIAGNOSIS — D509 Iron deficiency anemia, unspecified: Secondary | ICD-10-CM | POA: Diagnosis not present

## 2021-01-14 DIAGNOSIS — S066X0D Traumatic subarachnoid hemorrhage without loss of consciousness, subsequent encounter: Secondary | ICD-10-CM | POA: Diagnosis not present

## 2021-01-14 DIAGNOSIS — S2242XD Multiple fractures of ribs, left side, subsequent encounter for fracture with routine healing: Secondary | ICD-10-CM | POA: Diagnosis not present

## 2021-01-14 DIAGNOSIS — G2581 Restless legs syndrome: Secondary | ICD-10-CM | POA: Diagnosis not present

## 2021-01-14 DIAGNOSIS — I10 Essential (primary) hypertension: Secondary | ICD-10-CM | POA: Diagnosis not present

## 2021-01-15 ENCOUNTER — Ambulatory Visit: Payer: Medicare Other | Admitting: Gastroenterology

## 2021-01-15 DIAGNOSIS — S066X0D Traumatic subarachnoid hemorrhage without loss of consciousness, subsequent encounter: Secondary | ICD-10-CM | POA: Diagnosis not present

## 2021-01-15 DIAGNOSIS — I10 Essential (primary) hypertension: Secondary | ICD-10-CM | POA: Diagnosis not present

## 2021-01-15 DIAGNOSIS — E039 Hypothyroidism, unspecified: Secondary | ICD-10-CM | POA: Diagnosis not present

## 2021-01-15 DIAGNOSIS — S2242XD Multiple fractures of ribs, left side, subsequent encounter for fracture with routine healing: Secondary | ICD-10-CM | POA: Diagnosis not present

## 2021-01-15 DIAGNOSIS — D509 Iron deficiency anemia, unspecified: Secondary | ICD-10-CM | POA: Diagnosis not present

## 2021-01-15 DIAGNOSIS — G2581 Restless legs syndrome: Secondary | ICD-10-CM | POA: Diagnosis not present

## 2021-01-16 DIAGNOSIS — I10 Essential (primary) hypertension: Secondary | ICD-10-CM | POA: Diagnosis not present

## 2021-01-16 DIAGNOSIS — S2242XD Multiple fractures of ribs, left side, subsequent encounter for fracture with routine healing: Secondary | ICD-10-CM | POA: Diagnosis not present

## 2021-01-16 DIAGNOSIS — G2581 Restless legs syndrome: Secondary | ICD-10-CM | POA: Diagnosis not present

## 2021-01-16 DIAGNOSIS — D509 Iron deficiency anemia, unspecified: Secondary | ICD-10-CM | POA: Diagnosis not present

## 2021-01-16 DIAGNOSIS — S066X0D Traumatic subarachnoid hemorrhage without loss of consciousness, subsequent encounter: Secondary | ICD-10-CM | POA: Diagnosis not present

## 2021-01-16 DIAGNOSIS — E039 Hypothyroidism, unspecified: Secondary | ICD-10-CM | POA: Diagnosis not present

## 2021-01-17 DIAGNOSIS — S2242XD Multiple fractures of ribs, left side, subsequent encounter for fracture with routine healing: Secondary | ICD-10-CM | POA: Diagnosis not present

## 2021-01-17 DIAGNOSIS — D509 Iron deficiency anemia, unspecified: Secondary | ICD-10-CM | POA: Diagnosis not present

## 2021-01-17 DIAGNOSIS — E039 Hypothyroidism, unspecified: Secondary | ICD-10-CM | POA: Diagnosis not present

## 2021-01-17 DIAGNOSIS — G2581 Restless legs syndrome: Secondary | ICD-10-CM | POA: Diagnosis not present

## 2021-01-17 DIAGNOSIS — I10 Essential (primary) hypertension: Secondary | ICD-10-CM | POA: Diagnosis not present

## 2021-01-17 DIAGNOSIS — S066X0D Traumatic subarachnoid hemorrhage without loss of consciousness, subsequent encounter: Secondary | ICD-10-CM | POA: Diagnosis not present

## 2021-01-21 ENCOUNTER — Encounter
Payer: Medicare Other | Attending: Physical Medicine and Rehabilitation | Admitting: Physical Medicine and Rehabilitation

## 2021-01-21 ENCOUNTER — Other Ambulatory Visit: Payer: Self-pay

## 2021-01-21 ENCOUNTER — Encounter: Payer: Self-pay | Admitting: Physical Medicine and Rehabilitation

## 2021-01-21 VITALS — BP 120/78 | HR 66 | Temp 98.5°F | Ht 63.0 in | Wt 119.0 lb

## 2021-01-21 DIAGNOSIS — G250 Essential tremor: Secondary | ICD-10-CM | POA: Diagnosis not present

## 2021-01-21 DIAGNOSIS — S066X0D Traumatic subarachnoid hemorrhage without loss of consciousness, subsequent encounter: Secondary | ICD-10-CM | POA: Diagnosis not present

## 2021-01-21 DIAGNOSIS — F411 Generalized anxiety disorder: Secondary | ICD-10-CM | POA: Insufficient documentation

## 2021-01-21 MED ORDER — LORAZEPAM 0.5 MG PO TABS: 0.2500 mg | ORAL_TABLET | Freq: Four times a day (QID) | ORAL | 0 refills | Status: AC | PRN

## 2021-01-21 MED ORDER — PROPRANOLOL HCL 10 MG PO TABS: 10.0000 mg | ORAL_TABLET | Freq: Two times a day (BID) | ORAL | 3 refills | Status: AC

## 2021-01-21 NOTE — Progress Notes (Signed)
Subjective:    Patient ID: Cheryl Stafford, female    DOB: [birth year], 88 y.o.   MRN: [id number]  HPI Cheryl Stafford is an 88 year old woman who presents for hospital follow-up after CIR admission for [facility].   1) essential tremor -would like to restart propanolol which she had taken in the past and which helped with her symptoms.   2) Anxiety; -she finds that the Ativan helps -she was taking it before -she is taking one ativan every morning -she has been taking 1mg  Ativan  -her son has discussed that the less medications the better for her -has a week left of it.   3) Dizziness: -resolved.   4) SAH - walking through her house -has not been outside because her yard is not level -no falls.  -no headaches.  -  3)    Pain Inventory Average Pain 6 Pain Right Now 6 My pain is dull and aching  LOCATION OF PAIN  back  BOWEL Number of stools per week: 7 Oral laxative use No   BLADDER Normal  Mobility use a cane ability to climb steps?  yes do you drive?  yes  Function retired  Neuro/Psych weakness tremor  Prior Studies Any changes since last visit?  no  Physicians involved in your care Any changes since last visit?  no   Family History  Problem Relation Age of Onset   Colon cancer Neg Hx    Stomach cancer Neg Hx    Social History   Socioeconomic History   Marital status: Widowed    Spouse name: Not on file   Number of children: Not on file   Years of education: Not on file   Highest education level: Not on file  Occupational History   Not on file  Tobacco Use   Smoking status: Never   Smokeless tobacco: Never  Vaping Use   Vaping Use: Never used  Substance and Sexual Activity   Alcohol use: Never   Drug use: Never   Sexual activity: Not on file  Other Topics Concern   Not on file  Social History Narrative   Not on file   Social Determinants of Health   Financial Resource Strain: Not on file  Food Insecurity: Not on file   Transportation Needs: Not on file  Physical Activity: Not on file  Stress: Not on file  Social Connections: Not on file   Past Surgical History:  Procedure Laterality Date   ABDOMINAL HYSTERECTOMY     COLONOSCOPY  06/07/2012   Colonic polyp status post polypectomy, Pancolonic diverticulosis redominantly in the sigmoid colon. Examination was limited due to qualitiy of preparation.   ESOPHAGOGASTRODUODENOSCOPY  07/30/2016   Highly torturous esophagus suggestive of motility disorder/presbyesophagus. Schatzki's ring status post esophageal dilatation. Large hiatal hernia. Mild gastritis.    TOTAL KNEE ARTHROPLASTY  01/02/2015   Past Medical History:  Diagnosis Date   Esophageal dysphagia    Functional diarrhea    GERD (gastroesophageal reflux disease)    Hypertension    Hypothyroid    IDA (iron deficiency anemia)    Prediabetes 12/12/2020   BP 120/78    Pulse 66    Temp 98.5 F (36.9 C)    Ht 5\' 3"  (1.6 m)    Wt 119 lb (54 kg)    SpO2 95%    BMI 21.08 kg/m   Opioid Risk Score:   Fall Risk Score:  `1  Depression screen PHQ 2/9  Depression screen [facility] 2/9 01/21/2021  Decreased Interest 0  Down, Depressed, Hopeless 0  PHQ - 2 Score 0  Altered sleeping 0  Tired, decreased energy 1  Change in appetite 0  Feeling bad or failure about yourself  0  Trouble concentrating 0  Moving slowly or fidgety/restless 0  Suicidal thoughts 0  PHQ-9 Score 1    Review of Systems  Musculoskeletal:  Positive for gait problem.  Neurological:  Positive for tremors and weakness.  All other systems reviewed and are negative.     Objective:   Physical Exam        Assessment & Plan:  1) Essential tremor -prescribed propanolol 10mg  BID -discussed that essential tremor is genetic -recommended weighted utensils/pen/pencil -discussed that propanolol can lower blood pressure and heart rate. Discussed that if she fills dizzy, weak, or lightheaded this could be a side effect of the medication.  Discussed that propanolol can help with anxiety  2) Anxiety: -prescribed xanax 0.25mg  q6H prn for anxiety -discussed side effects of dependency, tolerance, dementia, increased fall risk, lethargy, somnolence with Xanax.  -discussed that the Lexapro can help prevent anxiety and depression -discussed that propanolol can help with anxiety.  -Discussed exercise and meditation as tools to decrease anxiety. -Recommended Down Dog Yoga app -Discussed spending time outdoors. -Discussed positive re-framing of anxiety.  -Discussed the following foods that have been show to reduce anxiety: 1) [place] nuts, mushrooms, soy beans due to their high selenium content. Upper limit of toxicity of selenium is 447mcg/day so no more than 3-4 [place] nuts per day. Discussed that selenium is also good for hair and nails.  2) Fatty fish such as salmon, mackerel, sardines, trout, and herring- high in omega-3 fatty acids 3) Eggs- increases serotonin and dopamine 4) Pumpkin seeds- high in omega-3 fatty acids 5) dark chocolate- high in flavanols that increase blood flow to brain 6) turmeric- take with black pepper to increase absorption 7) chamomile tea- antioxidant and anti-inflammatory properties 8) yogurt without sugar- supports gut-brain axis 9) green tea- contains L- theanine 10) blueberries- high in vitamin C and antioxidants 11) [place]- high in tryptophan which gets converted to serotonin 12) bell peppers- rich in vitamin C and antioxidants 13) citrus fruits- rich in vitamin C and antioxidants 14) almonds- high in vitamin E and healthy fats 15) chia seeds- high in omega-3 fatty acids  3) ICH  Continue therapy  Follow-up in 6 months. Advised that she can cancel appointment if she no longer needs.   40 minutes spent in discussion of essential tremor, documentation, discussion if anxiety, recovery from [place], current walking, documentation

## 2021-01-21 NOTE — Patient Instructions (Signed)
Anxiety: -Discussed exercise and meditation as tools to decrease anxiety. -Recommended Down Dog Yoga app -Discussed spending time outdoors. -Discussed positive re-framing of anxiety.  -Discussed the following foods that have been show to reduce anxiety: 1) [place] nuts, mushrooms, soy beans due to their high selenium content. Upper limit of toxicity of selenium is 417mcg/day so no more than 3-4 [place] nuts per day.  2) Fatty fish such as salmon, mackerel, sardines, trout, and herring- high in omega-3 fatty acids 3) Eggs- increases serotonin and dopamine 4) Pumpkin seeds- high in omega-3 fatty acids 5) dark chocolate- high in flavanols that increase blood flow to brain 6) turmeric- take with black pepper to increase absorption 7) chamomile tea- antioxidant and anti-inflammatory properties 8) yogurt without sugar- supports gut-brain axis 9) green tea- contains L- theanine 10) blueberries- high in vitamin C and antioxidants 11) [place]- high in tryptophan which gets converted to serotonin 12) bell peppers- rich in vitamin C and antioxidants 13) citrus fruits- rich in vitamin C and antioxidants 14) almonds- high in vitamin E and healthy fats 15) chia seeds- high in omega-3 fatty acids

## 2021-01-22 DIAGNOSIS — Y92009 Unspecified place in unspecified non-institutional (private) residence as the place of occurrence of the external cause: Secondary | ICD-10-CM | POA: Diagnosis not present

## 2021-01-22 DIAGNOSIS — S065XAA Traumatic subdural hemorrhage with loss of consciousness status unknown, initial encounter: Secondary | ICD-10-CM | POA: Diagnosis not present

## 2021-01-22 DIAGNOSIS — F0781 Postconcussional syndrome: Secondary | ICD-10-CM | POA: Diagnosis not present

## 2021-01-22 DIAGNOSIS — I609 Nontraumatic subarachnoid hemorrhage, unspecified: Secondary | ICD-10-CM | POA: Diagnosis not present

## 2021-01-22 DIAGNOSIS — S0633AA Contusion and laceration of cerebrum, unspecified, with loss of consciousness status unknown, initial encounter: Secondary | ICD-10-CM | POA: Diagnosis not present

## 2021-01-22 DIAGNOSIS — E039 Hypothyroidism, unspecified: Secondary | ICD-10-CM | POA: Diagnosis not present

## 2021-01-22 DIAGNOSIS — G2581 Restless legs syndrome: Secondary | ICD-10-CM | POA: Diagnosis not present

## 2021-01-22 DIAGNOSIS — I10 Essential (primary) hypertension: Secondary | ICD-10-CM | POA: Diagnosis not present

## 2021-01-22 DIAGNOSIS — D509 Iron deficiency anemia, unspecified: Secondary | ICD-10-CM | POA: Diagnosis not present

## 2021-01-22 DIAGNOSIS — W19XXXA Unspecified fall, initial encounter: Secondary | ICD-10-CM | POA: Diagnosis not present

## 2021-01-22 DIAGNOSIS — S066X0D Traumatic subarachnoid hemorrhage without loss of consciousness, subsequent encounter: Secondary | ICD-10-CM | POA: Diagnosis not present

## 2021-01-22 DIAGNOSIS — S2242XA Multiple fractures of ribs, left side, initial encounter for closed fracture: Secondary | ICD-10-CM | POA: Diagnosis not present

## 2021-01-22 DIAGNOSIS — S2242XD Multiple fractures of ribs, left side, subsequent encounter for fracture with routine healing: Secondary | ICD-10-CM | POA: Diagnosis not present

## 2021-01-23 DIAGNOSIS — S2242XD Multiple fractures of ribs, left side, subsequent encounter for fracture with routine healing: Secondary | ICD-10-CM | POA: Diagnosis not present

## 2021-01-23 DIAGNOSIS — D509 Iron deficiency anemia, unspecified: Secondary | ICD-10-CM | POA: Diagnosis not present

## 2021-01-23 DIAGNOSIS — E039 Hypothyroidism, unspecified: Secondary | ICD-10-CM | POA: Diagnosis not present

## 2021-01-23 DIAGNOSIS — S066X0D Traumatic subarachnoid hemorrhage without loss of consciousness, subsequent encounter: Secondary | ICD-10-CM | POA: Diagnosis not present

## 2021-01-23 DIAGNOSIS — G2581 Restless legs syndrome: Secondary | ICD-10-CM | POA: Diagnosis not present

## 2021-01-23 DIAGNOSIS — I10 Essential (primary) hypertension: Secondary | ICD-10-CM | POA: Diagnosis not present

## 2021-01-24 DIAGNOSIS — I10 Essential (primary) hypertension: Secondary | ICD-10-CM | POA: Diagnosis not present

## 2021-01-24 DIAGNOSIS — G2581 Restless legs syndrome: Secondary | ICD-10-CM | POA: Diagnosis not present

## 2021-01-24 DIAGNOSIS — D509 Iron deficiency anemia, unspecified: Secondary | ICD-10-CM | POA: Diagnosis not present

## 2021-01-24 DIAGNOSIS — S2242XD Multiple fractures of ribs, left side, subsequent encounter for fracture with routine healing: Secondary | ICD-10-CM | POA: Diagnosis not present

## 2021-01-24 DIAGNOSIS — E039 Hypothyroidism, unspecified: Secondary | ICD-10-CM | POA: Diagnosis not present

## 2021-01-24 DIAGNOSIS — S066X0D Traumatic subarachnoid hemorrhage without loss of consciousness, subsequent encounter: Secondary | ICD-10-CM | POA: Diagnosis not present

## 2021-01-31 ENCOUNTER — Encounter: Payer: Medicare Other | Admitting: Gastroenterology

## 2021-01-31 DIAGNOSIS — G2581 Restless legs syndrome: Secondary | ICD-10-CM | POA: Diagnosis not present

## 2021-01-31 DIAGNOSIS — E039 Hypothyroidism, unspecified: Secondary | ICD-10-CM | POA: Diagnosis not present

## 2021-01-31 DIAGNOSIS — S066X0D Traumatic subarachnoid hemorrhage without loss of consciousness, subsequent encounter: Secondary | ICD-10-CM | POA: Diagnosis not present

## 2021-01-31 DIAGNOSIS — D509 Iron deficiency anemia, unspecified: Secondary | ICD-10-CM | POA: Diagnosis not present

## 2021-01-31 DIAGNOSIS — S2242XD Multiple fractures of ribs, left side, subsequent encounter for fracture with routine healing: Secondary | ICD-10-CM | POA: Diagnosis not present

## 2021-01-31 DIAGNOSIS — I10 Essential (primary) hypertension: Secondary | ICD-10-CM | POA: Diagnosis not present

## 2021-02-01 DIAGNOSIS — S066X0D Traumatic subarachnoid hemorrhage without loss of consciousness, subsequent encounter: Secondary | ICD-10-CM | POA: Diagnosis not present

## 2021-02-01 DIAGNOSIS — D509 Iron deficiency anemia, unspecified: Secondary | ICD-10-CM | POA: Diagnosis not present

## 2021-02-01 DIAGNOSIS — S2242XD Multiple fractures of ribs, left side, subsequent encounter for fracture with routine healing: Secondary | ICD-10-CM | POA: Diagnosis not present

## 2021-02-01 DIAGNOSIS — I10 Essential (primary) hypertension: Secondary | ICD-10-CM | POA: Diagnosis not present

## 2021-02-01 DIAGNOSIS — G2581 Restless legs syndrome: Secondary | ICD-10-CM | POA: Diagnosis not present

## 2021-02-01 DIAGNOSIS — E039 Hypothyroidism, unspecified: Secondary | ICD-10-CM | POA: Diagnosis not present

## 2021-02-02 DIAGNOSIS — I700 Atherosclerosis of aorta: Secondary | ICD-10-CM | POA: Diagnosis not present

## 2021-02-02 DIAGNOSIS — S2242XD Multiple fractures of ribs, left side, subsequent encounter for fracture with routine healing: Secondary | ICD-10-CM | POA: Diagnosis not present

## 2021-02-02 DIAGNOSIS — K449 Diaphragmatic hernia without obstruction or gangrene: Secondary | ICD-10-CM | POA: Diagnosis not present

## 2021-02-02 DIAGNOSIS — M859 Disorder of bone density and structure, unspecified: Secondary | ICD-10-CM | POA: Diagnosis not present

## 2021-02-02 DIAGNOSIS — I10 Essential (primary) hypertension: Secondary | ICD-10-CM | POA: Diagnosis not present

## 2021-02-02 DIAGNOSIS — G2581 Restless legs syndrome: Secondary | ICD-10-CM | POA: Diagnosis not present

## 2021-02-02 DIAGNOSIS — S066X0D Traumatic subarachnoid hemorrhage without loss of consciousness, subsequent encounter: Secondary | ICD-10-CM | POA: Diagnosis not present

## 2021-02-02 DIAGNOSIS — D509 Iron deficiency anemia, unspecified: Secondary | ICD-10-CM | POA: Diagnosis not present

## 2021-02-02 DIAGNOSIS — E119 Type 2 diabetes mellitus without complications: Secondary | ICD-10-CM | POA: Diagnosis not present

## 2021-02-02 DIAGNOSIS — M419 Scoliosis, unspecified: Secondary | ICD-10-CM | POA: Diagnosis not present

## 2021-02-02 DIAGNOSIS — Z602 Problems related to living alone: Secondary | ICD-10-CM | POA: Diagnosis not present

## 2021-02-02 DIAGNOSIS — K8020 Calculus of gallbladder without cholecystitis without obstruction: Secondary | ICD-10-CM | POA: Diagnosis not present

## 2021-02-02 DIAGNOSIS — I2510 Atherosclerotic heart disease of native coronary artery without angina pectoris: Secondary | ICD-10-CM | POA: Diagnosis not present

## 2021-02-02 DIAGNOSIS — K219 Gastro-esophageal reflux disease without esophagitis: Secondary | ICD-10-CM | POA: Diagnosis not present

## 2021-02-02 DIAGNOSIS — E039 Hypothyroidism, unspecified: Secondary | ICD-10-CM | POA: Diagnosis not present

## 2021-02-02 DIAGNOSIS — Z9181 History of falling: Secondary | ICD-10-CM | POA: Diagnosis not present

## 2021-02-02 DIAGNOSIS — K5900 Constipation, unspecified: Secondary | ICD-10-CM | POA: Diagnosis not present

## 2021-02-05 DIAGNOSIS — S2242XD Multiple fractures of ribs, left side, subsequent encounter for fracture with routine healing: Secondary | ICD-10-CM | POA: Diagnosis not present

## 2021-02-05 DIAGNOSIS — G2581 Restless legs syndrome: Secondary | ICD-10-CM | POA: Diagnosis not present

## 2021-02-05 DIAGNOSIS — D509 Iron deficiency anemia, unspecified: Secondary | ICD-10-CM | POA: Diagnosis not present

## 2021-02-05 DIAGNOSIS — I10 Essential (primary) hypertension: Secondary | ICD-10-CM | POA: Diagnosis not present

## 2021-02-05 DIAGNOSIS — E039 Hypothyroidism, unspecified: Secondary | ICD-10-CM | POA: Diagnosis not present

## 2021-02-05 DIAGNOSIS — S066X0D Traumatic subarachnoid hemorrhage without loss of consciousness, subsequent encounter: Secondary | ICD-10-CM | POA: Diagnosis not present

## 2021-02-08 DIAGNOSIS — S066X0D Traumatic subarachnoid hemorrhage without loss of consciousness, subsequent encounter: Secondary | ICD-10-CM | POA: Diagnosis not present

## 2021-02-08 DIAGNOSIS — G2581 Restless legs syndrome: Secondary | ICD-10-CM | POA: Diagnosis not present

## 2021-02-08 DIAGNOSIS — S2242XD Multiple fractures of ribs, left side, subsequent encounter for fracture with routine healing: Secondary | ICD-10-CM | POA: Diagnosis not present

## 2021-02-08 DIAGNOSIS — I10 Essential (primary) hypertension: Secondary | ICD-10-CM | POA: Diagnosis not present

## 2021-02-08 DIAGNOSIS — E039 Hypothyroidism, unspecified: Secondary | ICD-10-CM | POA: Diagnosis not present

## 2021-02-08 DIAGNOSIS — D509 Iron deficiency anemia, unspecified: Secondary | ICD-10-CM | POA: Diagnosis not present

## 2021-02-12 DIAGNOSIS — G2581 Restless legs syndrome: Secondary | ICD-10-CM | POA: Diagnosis not present

## 2021-02-12 DIAGNOSIS — D509 Iron deficiency anemia, unspecified: Secondary | ICD-10-CM | POA: Diagnosis not present

## 2021-02-12 DIAGNOSIS — I10 Essential (primary) hypertension: Secondary | ICD-10-CM | POA: Diagnosis not present

## 2021-02-12 DIAGNOSIS — S2242XD Multiple fractures of ribs, left side, subsequent encounter for fracture with routine healing: Secondary | ICD-10-CM | POA: Diagnosis not present

## 2021-02-12 DIAGNOSIS — E039 Hypothyroidism, unspecified: Secondary | ICD-10-CM | POA: Diagnosis not present

## 2021-02-12 DIAGNOSIS — S066X0D Traumatic subarachnoid hemorrhage without loss of consciousness, subsequent encounter: Secondary | ICD-10-CM | POA: Diagnosis not present

## 2021-02-14 ENCOUNTER — Telehealth: Payer: Self-pay | Admitting: Gastroenterology

## 2021-02-14 ENCOUNTER — Encounter: Payer: Medicare Other | Admitting: Gastroenterology

## 2021-02-15 DIAGNOSIS — G2581 Restless legs syndrome: Secondary | ICD-10-CM | POA: Diagnosis not present

## 2021-02-15 DIAGNOSIS — S2242XD Multiple fractures of ribs, left side, subsequent encounter for fracture with routine healing: Secondary | ICD-10-CM | POA: Diagnosis not present

## 2021-02-15 DIAGNOSIS — S066X0D Traumatic subarachnoid hemorrhage without loss of consciousness, subsequent encounter: Secondary | ICD-10-CM | POA: Diagnosis not present

## 2021-02-15 DIAGNOSIS — I10 Essential (primary) hypertension: Secondary | ICD-10-CM | POA: Diagnosis not present

## 2021-02-15 DIAGNOSIS — D509 Iron deficiency anemia, unspecified: Secondary | ICD-10-CM | POA: Diagnosis not present

## 2021-02-15 DIAGNOSIS — E039 Hypothyroidism, unspecified: Secondary | ICD-10-CM | POA: Diagnosis not present

## 2021-02-21 DIAGNOSIS — D509 Iron deficiency anemia, unspecified: Secondary | ICD-10-CM | POA: Diagnosis not present

## 2021-02-21 DIAGNOSIS — E039 Hypothyroidism, unspecified: Secondary | ICD-10-CM | POA: Diagnosis not present

## 2021-02-21 DIAGNOSIS — S2242XD Multiple fractures of ribs, left side, subsequent encounter for fracture with routine healing: Secondary | ICD-10-CM | POA: Diagnosis not present

## 2021-02-21 DIAGNOSIS — S066X0D Traumatic subarachnoid hemorrhage without loss of consciousness, subsequent encounter: Secondary | ICD-10-CM | POA: Diagnosis not present

## 2021-02-21 DIAGNOSIS — G2581 Restless legs syndrome: Secondary | ICD-10-CM | POA: Diagnosis not present

## 2021-02-21 DIAGNOSIS — I10 Essential (primary) hypertension: Secondary | ICD-10-CM | POA: Diagnosis not present

## 2021-02-28 DIAGNOSIS — I10 Essential (primary) hypertension: Secondary | ICD-10-CM | POA: Diagnosis not present

## 2021-02-28 DIAGNOSIS — S066X0D Traumatic subarachnoid hemorrhage without loss of consciousness, subsequent encounter: Secondary | ICD-10-CM | POA: Diagnosis not present

## 2021-02-28 DIAGNOSIS — D509 Iron deficiency anemia, unspecified: Secondary | ICD-10-CM | POA: Diagnosis not present

## 2021-02-28 DIAGNOSIS — E039 Hypothyroidism, unspecified: Secondary | ICD-10-CM | POA: Diagnosis not present

## 2021-02-28 DIAGNOSIS — S2242XD Multiple fractures of ribs, left side, subsequent encounter for fracture with routine healing: Secondary | ICD-10-CM | POA: Diagnosis not present

## 2021-02-28 DIAGNOSIS — G2581 Restless legs syndrome: Secondary | ICD-10-CM | POA: Diagnosis not present

## 2021-03-18 DIAGNOSIS — I10 Essential (primary) hypertension: Secondary | ICD-10-CM | POA: Diagnosis not present

## 2021-03-18 DIAGNOSIS — E1169 Type 2 diabetes mellitus with other specified complication: Secondary | ICD-10-CM | POA: Diagnosis not present

## 2021-03-18 DIAGNOSIS — E785 Hyperlipidemia, unspecified: Secondary | ICD-10-CM | POA: Diagnosis not present

## 2021-03-18 DIAGNOSIS — G2581 Restless legs syndrome: Secondary | ICD-10-CM | POA: Diagnosis not present

## 2021-03-18 DIAGNOSIS — E039 Hypothyroidism, unspecified: Secondary | ICD-10-CM | POA: Diagnosis not present

## 2021-03-18 DIAGNOSIS — G4700 Insomnia, unspecified: Secondary | ICD-10-CM | POA: Diagnosis not present

## 2021-03-18 DIAGNOSIS — R300 Dysuria: Secondary | ICD-10-CM | POA: Diagnosis not present

## 2021-03-18 DIAGNOSIS — M810 Age-related osteoporosis without current pathological fracture: Secondary | ICD-10-CM | POA: Diagnosis not present

## 2021-03-18 DIAGNOSIS — D509 Iron deficiency anemia, unspecified: Secondary | ICD-10-CM | POA: Diagnosis not present

## 2021-04-04 DIAGNOSIS — M419 Scoliosis, unspecified: Secondary | ICD-10-CM | POA: Diagnosis not present

## 2021-04-04 DIAGNOSIS — M4156 Other secondary scoliosis, lumbar region: Secondary | ICD-10-CM | POA: Diagnosis not present

## 2021-06-19 DIAGNOSIS — G2581 Restless legs syndrome: Secondary | ICD-10-CM | POA: Diagnosis not present

## 2021-06-19 DIAGNOSIS — K219 Gastro-esophageal reflux disease without esophagitis: Secondary | ICD-10-CM | POA: Diagnosis not present

## 2021-06-19 DIAGNOSIS — G4700 Insomnia, unspecified: Secondary | ICD-10-CM | POA: Diagnosis not present

## 2021-06-19 DIAGNOSIS — R8290 Unspecified abnormal findings in urine: Secondary | ICD-10-CM | POA: Diagnosis not present

## 2021-06-19 DIAGNOSIS — E785 Hyperlipidemia, unspecified: Secondary | ICD-10-CM | POA: Diagnosis not present

## 2021-06-19 DIAGNOSIS — E039 Hypothyroidism, unspecified: Secondary | ICD-10-CM | POA: Diagnosis not present

## 2021-06-19 DIAGNOSIS — I10 Essential (primary) hypertension: Secondary | ICD-10-CM | POA: Diagnosis not present

## 2021-06-19 DIAGNOSIS — J309 Allergic rhinitis, unspecified: Secondary | ICD-10-CM | POA: Diagnosis not present

## 2021-06-19 DIAGNOSIS — E1169 Type 2 diabetes mellitus with other specified complication: Secondary | ICD-10-CM | POA: Diagnosis not present

## 2021-06-19 DIAGNOSIS — D509 Iron deficiency anemia, unspecified: Secondary | ICD-10-CM | POA: Diagnosis not present

## 2021-06-19 DIAGNOSIS — M810 Age-related osteoporosis without current pathological fracture: Secondary | ICD-10-CM | POA: Diagnosis not present

## 2021-07-17 DIAGNOSIS — R6884 Jaw pain: Secondary | ICD-10-CM | POA: Diagnosis not present

## 2021-07-17 DIAGNOSIS — R110 Nausea: Secondary | ICD-10-CM | POA: Diagnosis not present

## 2021-07-17 DIAGNOSIS — R0789 Other chest pain: Secondary | ICD-10-CM | POA: Diagnosis not present

## 2021-07-17 DIAGNOSIS — M940 Chondrocostal junction syndrome [Tietze]: Secondary | ICD-10-CM | POA: Diagnosis not present

## 2021-07-17 DIAGNOSIS — I4891 Unspecified atrial fibrillation: Secondary | ICD-10-CM | POA: Diagnosis not present

## 2021-07-17 DIAGNOSIS — R079 Chest pain, unspecified: Secondary | ICD-10-CM | POA: Diagnosis not present

## 2021-07-17 DIAGNOSIS — R071 Chest pain on breathing: Secondary | ICD-10-CM | POA: Diagnosis not present

## 2021-07-23 DIAGNOSIS — E039 Hypothyroidism, unspecified: Secondary | ICD-10-CM | POA: Diagnosis not present

## 2021-07-24 NOTE — Progress Notes (Deleted)
Subjective:    Patient ID: Cheryl Stafford, female    DOB: [birth year], 88 y.o.   MRN: [id number]  HPI Cheryl Stafford is an 88 year old woman who presents for hospital follow-up after CIR admission for [facility].   1) essential tremor -would like to restart propanolol which she had taken in the past and which helped with her symptoms.   2) Anxiety; -she finds that the Ativan helps -she was taking it before -she is taking one ativan every morning -she has been taking '1mg'$  Ativan  -her son has discussed that the less medications the better for her -has a week left of it.   3) Dizziness: -resolved.   4) SAH - walking through her house -has not been outside because her yard is not level -no falls.  -no headaches.  -  3)    Pain Inventory Average Pain 6 Pain Right Now 6 My pain is dull and aching  LOCATION OF PAIN  back  BOWEL Number of stools per week: 7 Oral laxative use No   BLADDER Normal  Mobility use a cane ability to climb steps?  yes do you drive?  yes  Function retired  Neuro/Psych weakness tremor  Prior Studies Any changes since last visit?  no  Physicians involved in your care Any changes since last visit?  no   Family History  Problem Relation Age of Onset  . Colon cancer Neg Hx   . Stomach cancer Neg Hx    Social History   Socioeconomic History  . Marital status: Widowed    Spouse name: Not on file  . Number of children: Not on file  . Years of education: Not on file  . Highest education level: Not on file  Occupational History  . Not on file  Tobacco Use  . Smoking status: Never  . Smokeless tobacco: Never  Vaping Use  . Vaping Use: Never used  Substance and Sexual Activity  . Alcohol use: Never  . Drug use: Never  . Sexual activity: Not on file  Other Topics Concern  . Not on file  Social History Narrative  . Not on file   Social Determinants of Health   Financial Resource Strain: Not on file  Food Insecurity: Not on file   Transportation Needs: Not on file  Physical Activity: Not on file  Stress: Not on file  Social Connections: Not on file   Past Surgical History:  Procedure Laterality Date  . ABDOMINAL HYSTERECTOMY    . COLONOSCOPY  06/07/2012   Colonic polyp status post polypectomy, Pancolonic diverticulosis redominantly in the sigmoid colon. Examination was limited due to qualitiy of preparation.  . ESOPHAGOGASTRODUODENOSCOPY  07/30/2016   Highly torturous esophagus suggestive of motility disorder/presbyesophagus. Schatzki's ring status post esophageal dilatation. Large hiatal hernia. Mild gastritis.   Marland Kitchen TOTAL KNEE ARTHROPLASTY  01/02/2015   Past Medical History:  Diagnosis Date  . Esophageal dysphagia   . Functional diarrhea   . GERD (gastroesophageal reflux disease)   . Hypertension   . Hypothyroid   . IDA (iron deficiency anemia)   . Prediabetes 12/12/2020   There were no vitals taken for this visit.  Opioid Risk Score:   Fall Risk Score:  `1  Depression screen [facility] 2/9     01/21/2021   11:29 AM  Depression screen PHQ 2/9  Decreased Interest 0  Down, Depressed, Hopeless 0  PHQ - 2 Score 0  Altered sleeping 0  Tired, decreased energy 1  Change in appetite  0  Feeling bad or failure about yourself  0  Trouble concentrating 0  Moving slowly or fidgety/restless 0  Suicidal thoughts 0  PHQ-9 Score 1    Review of Systems  Musculoskeletal:  Positive for gait problem.  Neurological:  Positive for tremors and weakness.  All other systems reviewed and are negative.     Objective:   Physical Exam        Assessment & Plan:  1) Essential tremor -prescribed propanolol '10mg'$  BID -discussed that essential tremor is genetic -recommended weighted utensils/pen/pencil -discussed that propanolol can lower blood pressure and heart rate. Discussed that if she fills dizzy, weak, or lightheaded this could be a side effect of the medication. Discussed that propanolol can help with  anxiety  2) Anxiety: -prescribed xanax 0.'25mg'$  q6H prn for anxiety -discussed side effects of dependency, tolerance, dementia, increased fall risk, lethargy, somnolence with Xanax.  -discussed that the Lexapro can help prevent anxiety and depression -discussed that propanolol can help with anxiety.  -Discussed exercise and meditation as tools to decrease anxiety. -Recommended Down Dog Yoga app -Discussed spending time outdoors. -Discussed positive re-framing of anxiety.  -Discussed the following foods that have been show to reduce anxiety: 1) [place] nuts, mushrooms, soy beans due to their high selenium content. Upper limit of toxicity of selenium is 463mg/day so no more than 3-4 bBolivianuts per day. Discussed that selenium is also good for hair and nails.  2) Fatty fish such as salmon, mackerel, sardines, trout, and herring- high in omega-3 fatty acids 3) Eggs- increases serotonin and dopamine 4) Pumpkin seeds- high in omega-3 fatty acids 5) dark chocolate- high in flavanols that increase blood flow to brain 6) turmeric- take with black pepper to increase absorption 7) chamomile tea- antioxidant and anti-inflammatory properties 8) yogurt without sugar- supports gut-brain axis 9) green tea- contains L- theanine 10) blueberries- high in vitamin C and antioxidants 11) [place] high in tryptophan which gets converted to serotonin 12) bell peppers- rich in vitamin C and antioxidants 13) citrus fruits- rich in vitamin C and antioxidants 14) almonds- high in vitamin E and healthy fats 15) chia seeds- high in omega-3 fatty acids  3) ICH  Continue therapy  Follow-up in 6 months. Advised that she can cancel appointment if she no longer needs.   40 minutes spent in discussion of essential tremor, documentation, discussion if anxiety, recovery from [place] current walking, documentation

## 2021-07-30 ENCOUNTER — Encounter
Payer: Medicare Other | Attending: Physical Medicine and Rehabilitation | Admitting: Physical Medicine and Rehabilitation

## 2021-08-26 DIAGNOSIS — E039 Hypothyroidism, unspecified: Secondary | ICD-10-CM | POA: Diagnosis not present

## 2021-12-19 DIAGNOSIS — M810 Age-related osteoporosis without current pathological fracture: Secondary | ICD-10-CM | POA: Diagnosis not present

## 2021-12-19 DIAGNOSIS — E785 Hyperlipidemia, unspecified: Secondary | ICD-10-CM | POA: Diagnosis not present

## 2021-12-19 DIAGNOSIS — E1169 Type 2 diabetes mellitus with other specified complication: Secondary | ICD-10-CM | POA: Diagnosis not present

## 2021-12-19 DIAGNOSIS — D509 Iron deficiency anemia, unspecified: Secondary | ICD-10-CM | POA: Diagnosis not present

## 2021-12-19 DIAGNOSIS — G4700 Insomnia, unspecified: Secondary | ICD-10-CM | POA: Diagnosis not present

## 2021-12-19 DIAGNOSIS — G2581 Restless legs syndrome: Secondary | ICD-10-CM | POA: Diagnosis not present

## 2021-12-19 DIAGNOSIS — K219 Gastro-esophageal reflux disease without esophagitis: Secondary | ICD-10-CM | POA: Diagnosis not present

## 2021-12-19 DIAGNOSIS — J309 Allergic rhinitis, unspecified: Secondary | ICD-10-CM | POA: Diagnosis not present

## 2021-12-19 DIAGNOSIS — E039 Hypothyroidism, unspecified: Secondary | ICD-10-CM | POA: Diagnosis not present

## 2021-12-19 DIAGNOSIS — I10 Essential (primary) hypertension: Secondary | ICD-10-CM | POA: Diagnosis not present

## 2022-01-09 DIAGNOSIS — E860 Dehydration: Secondary | ICD-10-CM | POA: Diagnosis not present

## 2022-01-09 DIAGNOSIS — I959 Hypotension, unspecified: Secondary | ICD-10-CM | POA: Diagnosis not present

## 2022-02-07 DIAGNOSIS — N390 Urinary tract infection, site not specified: Secondary | ICD-10-CM | POA: Diagnosis not present

## 2022-06-25 DIAGNOSIS — M810 Age-related osteoporosis without current pathological fracture: Secondary | ICD-10-CM | POA: Diagnosis not present

## 2022-06-25 DIAGNOSIS — I10 Essential (primary) hypertension: Secondary | ICD-10-CM | POA: Diagnosis not present

## 2022-06-25 DIAGNOSIS — Z9181 History of falling: Secondary | ICD-10-CM | POA: Diagnosis not present

## 2022-06-25 DIAGNOSIS — K219 Gastro-esophageal reflux disease without esophagitis: Secondary | ICD-10-CM | POA: Diagnosis not present

## 2022-06-25 DIAGNOSIS — G4700 Insomnia, unspecified: Secondary | ICD-10-CM | POA: Diagnosis not present

## 2022-06-25 DIAGNOSIS — E039 Hypothyroidism, unspecified: Secondary | ICD-10-CM | POA: Diagnosis not present

## 2022-06-25 DIAGNOSIS — E785 Hyperlipidemia, unspecified: Secondary | ICD-10-CM | POA: Diagnosis not present

## 2022-06-25 DIAGNOSIS — D509 Iron deficiency anemia, unspecified: Secondary | ICD-10-CM | POA: Diagnosis not present

## 2022-06-25 DIAGNOSIS — G2581 Restless legs syndrome: Secondary | ICD-10-CM | POA: Diagnosis not present

## 2022-06-25 DIAGNOSIS — E1169 Type 2 diabetes mellitus with other specified complication: Secondary | ICD-10-CM | POA: Diagnosis not present

## 2022-07-08 DIAGNOSIS — M41124 Adolescent idiopathic scoliosis, thoracic region: Secondary | ICD-10-CM | POA: Diagnosis not present

## 2022-07-08 DIAGNOSIS — M41126 Adolescent idiopathic scoliosis, lumbar region: Secondary | ICD-10-CM | POA: Diagnosis not present

## 2022-08-13 DIAGNOSIS — M4125 Other idiopathic scoliosis, thoracolumbar region: Secondary | ICD-10-CM | POA: Diagnosis not present

## 2022-08-13 DIAGNOSIS — M9903 Segmental and somatic dysfunction of lumbar region: Secondary | ICD-10-CM | POA: Diagnosis not present

## 2022-08-13 DIAGNOSIS — M9905 Segmental and somatic dysfunction of pelvic region: Secondary | ICD-10-CM | POA: Diagnosis not present

## 2022-08-13 DIAGNOSIS — M9904 Segmental and somatic dysfunction of sacral region: Secondary | ICD-10-CM | POA: Diagnosis not present

## 2022-08-13 DIAGNOSIS — M9902 Segmental and somatic dysfunction of thoracic region: Secondary | ICD-10-CM | POA: Diagnosis not present

## 2022-08-18 DIAGNOSIS — M9903 Segmental and somatic dysfunction of lumbar region: Secondary | ICD-10-CM | POA: Diagnosis not present

## 2022-08-18 DIAGNOSIS — M9902 Segmental and somatic dysfunction of thoracic region: Secondary | ICD-10-CM | POA: Diagnosis not present

## 2022-08-18 DIAGNOSIS — M9905 Segmental and somatic dysfunction of pelvic region: Secondary | ICD-10-CM | POA: Diagnosis not present

## 2022-08-18 DIAGNOSIS — M9904 Segmental and somatic dysfunction of sacral region: Secondary | ICD-10-CM | POA: Diagnosis not present

## 2022-08-18 DIAGNOSIS — M4125 Other idiopathic scoliosis, thoracolumbar region: Secondary | ICD-10-CM | POA: Diagnosis not present

## 2022-08-20 DIAGNOSIS — M9904 Segmental and somatic dysfunction of sacral region: Secondary | ICD-10-CM | POA: Diagnosis not present

## 2022-08-20 DIAGNOSIS — M9903 Segmental and somatic dysfunction of lumbar region: Secondary | ICD-10-CM | POA: Diagnosis not present

## 2022-08-20 DIAGNOSIS — M9902 Segmental and somatic dysfunction of thoracic region: Secondary | ICD-10-CM | POA: Diagnosis not present

## 2022-08-20 DIAGNOSIS — M4125 Other idiopathic scoliosis, thoracolumbar region: Secondary | ICD-10-CM | POA: Diagnosis not present

## 2022-08-20 DIAGNOSIS — M9905 Segmental and somatic dysfunction of pelvic region: Secondary | ICD-10-CM | POA: Diagnosis not present

## 2022-08-22 DIAGNOSIS — M4125 Other idiopathic scoliosis, thoracolumbar region: Secondary | ICD-10-CM | POA: Diagnosis not present

## 2022-08-22 DIAGNOSIS — M9903 Segmental and somatic dysfunction of lumbar region: Secondary | ICD-10-CM | POA: Diagnosis not present

## 2022-08-22 DIAGNOSIS — M9905 Segmental and somatic dysfunction of pelvic region: Secondary | ICD-10-CM | POA: Diagnosis not present

## 2022-08-22 DIAGNOSIS — M9902 Segmental and somatic dysfunction of thoracic region: Secondary | ICD-10-CM | POA: Diagnosis not present

## 2022-08-22 DIAGNOSIS — M9904 Segmental and somatic dysfunction of sacral region: Secondary | ICD-10-CM | POA: Diagnosis not present

## 2022-08-26 DIAGNOSIS — M9902 Segmental and somatic dysfunction of thoracic region: Secondary | ICD-10-CM | POA: Diagnosis not present

## 2022-08-26 DIAGNOSIS — M4125 Other idiopathic scoliosis, thoracolumbar region: Secondary | ICD-10-CM | POA: Diagnosis not present

## 2022-08-26 DIAGNOSIS — M9903 Segmental and somatic dysfunction of lumbar region: Secondary | ICD-10-CM | POA: Diagnosis not present

## 2022-08-26 DIAGNOSIS — M9905 Segmental and somatic dysfunction of pelvic region: Secondary | ICD-10-CM | POA: Diagnosis not present

## 2022-08-26 DIAGNOSIS — M9904 Segmental and somatic dysfunction of sacral region: Secondary | ICD-10-CM | POA: Diagnosis not present

## 2022-09-04 DIAGNOSIS — M9903 Segmental and somatic dysfunction of lumbar region: Secondary | ICD-10-CM | POA: Diagnosis not present

## 2022-09-04 DIAGNOSIS — M9904 Segmental and somatic dysfunction of sacral region: Secondary | ICD-10-CM | POA: Diagnosis not present

## 2022-09-04 DIAGNOSIS — M9905 Segmental and somatic dysfunction of pelvic region: Secondary | ICD-10-CM | POA: Diagnosis not present

## 2022-09-04 DIAGNOSIS — M4125 Other idiopathic scoliosis, thoracolumbar region: Secondary | ICD-10-CM | POA: Diagnosis not present

## 2022-09-04 DIAGNOSIS — M9902 Segmental and somatic dysfunction of thoracic region: Secondary | ICD-10-CM | POA: Diagnosis not present

## 2022-09-11 DIAGNOSIS — M9902 Segmental and somatic dysfunction of thoracic region: Secondary | ICD-10-CM | POA: Diagnosis not present

## 2022-09-11 DIAGNOSIS — M9905 Segmental and somatic dysfunction of pelvic region: Secondary | ICD-10-CM | POA: Diagnosis not present

## 2022-09-11 DIAGNOSIS — M9904 Segmental and somatic dysfunction of sacral region: Secondary | ICD-10-CM | POA: Diagnosis not present

## 2022-09-11 DIAGNOSIS — M4125 Other idiopathic scoliosis, thoracolumbar region: Secondary | ICD-10-CM | POA: Diagnosis not present

## 2022-09-11 DIAGNOSIS — M9903 Segmental and somatic dysfunction of lumbar region: Secondary | ICD-10-CM | POA: Diagnosis not present

## 2022-09-18 DIAGNOSIS — M9903 Segmental and somatic dysfunction of lumbar region: Secondary | ICD-10-CM | POA: Diagnosis not present

## 2022-09-18 DIAGNOSIS — M4125 Other idiopathic scoliosis, thoracolumbar region: Secondary | ICD-10-CM | POA: Diagnosis not present

## 2022-09-18 DIAGNOSIS — M9904 Segmental and somatic dysfunction of sacral region: Secondary | ICD-10-CM | POA: Diagnosis not present

## 2022-09-18 DIAGNOSIS — M9902 Segmental and somatic dysfunction of thoracic region: Secondary | ICD-10-CM | POA: Diagnosis not present

## 2022-09-18 DIAGNOSIS — M9905 Segmental and somatic dysfunction of pelvic region: Secondary | ICD-10-CM | POA: Diagnosis not present

## 2022-10-08 DIAGNOSIS — M9904 Segmental and somatic dysfunction of sacral region: Secondary | ICD-10-CM | POA: Diagnosis not present

## 2022-10-08 DIAGNOSIS — M9902 Segmental and somatic dysfunction of thoracic region: Secondary | ICD-10-CM | POA: Diagnosis not present

## 2022-10-08 DIAGNOSIS — M4125 Other idiopathic scoliosis, thoracolumbar region: Secondary | ICD-10-CM | POA: Diagnosis not present

## 2022-10-08 DIAGNOSIS — M9903 Segmental and somatic dysfunction of lumbar region: Secondary | ICD-10-CM | POA: Diagnosis not present

## 2022-10-08 DIAGNOSIS — M9905 Segmental and somatic dysfunction of pelvic region: Secondary | ICD-10-CM | POA: Diagnosis not present

## 2022-10-19 IMAGING — CR DG LUMBAR SPINE COMPLETE 4+V
6 series · 6 of 6 positions shown · non-contrast
Comparison: 02/29/2016

CLINICAL DATA: Fall

EXAM:
LUMBAR SPINE - COMPLETE 4+ VIEW

[l-spine ap (1 of 2)]
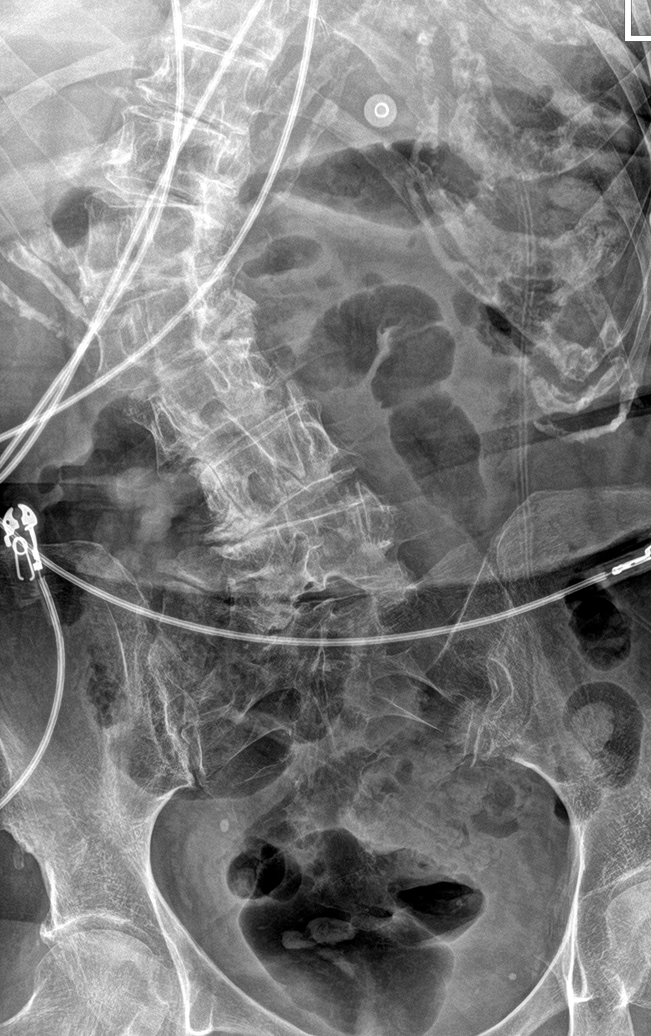

[l-spine obl (1 of 2)]
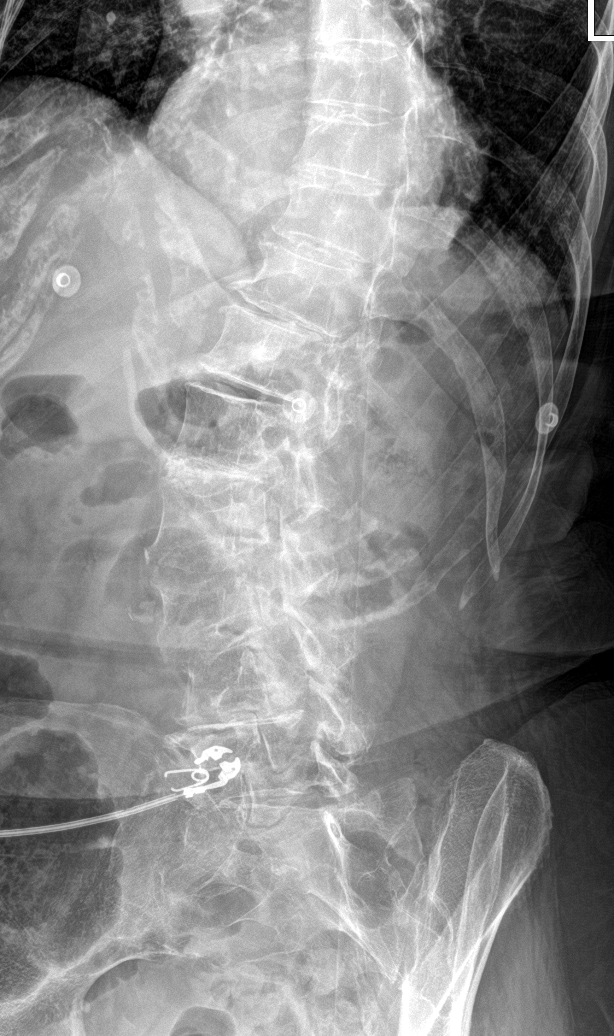

[l-spine obl (2 of 2)]
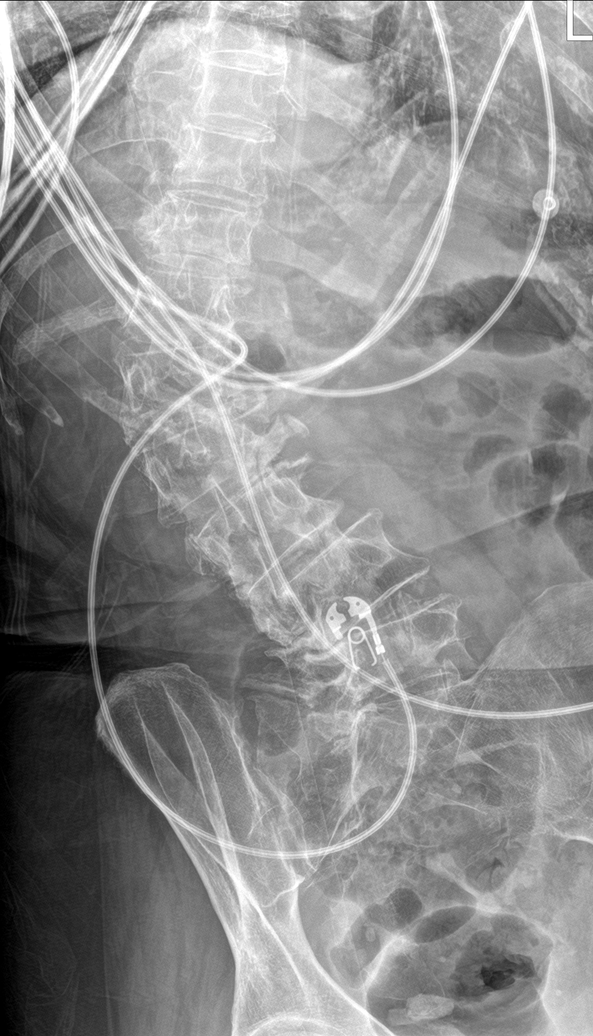

[l-spine lat]
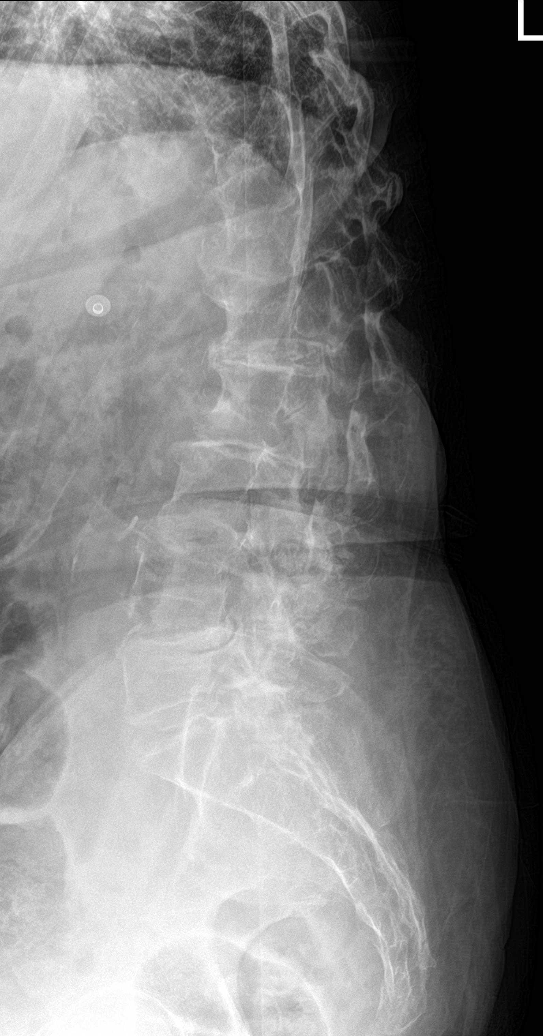

[l-spine spot]
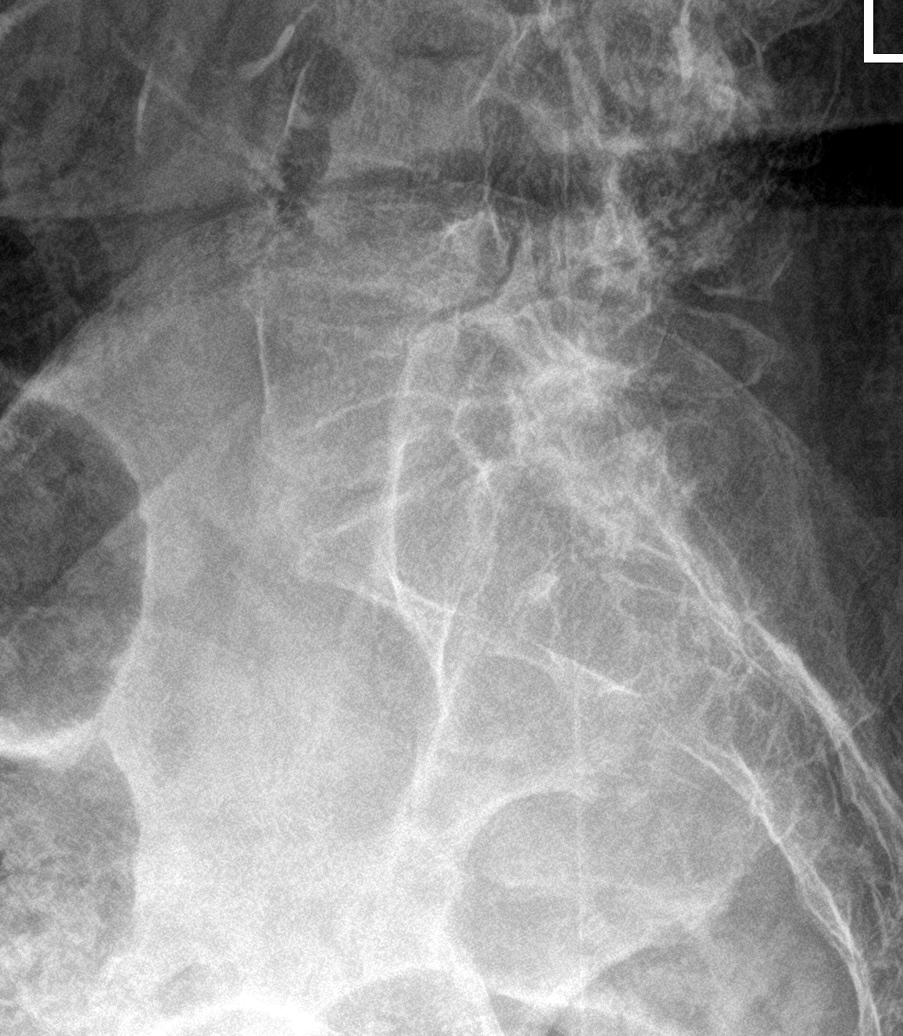

[l-spine ap (2 of 2)]
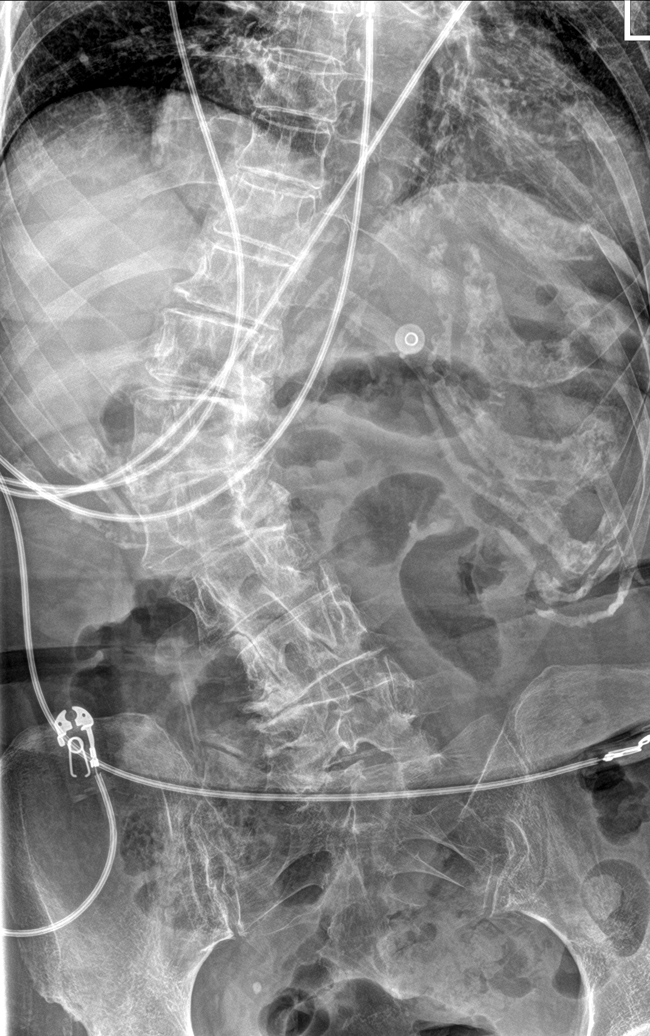

[6 of 6 positions shown; findings below may reference images not displayed]

FINDINGS: Dextroscoliosis. Vertebral body heights are maintained. Multilevel
facet arthrosis.
IMPRESSION: No acute fracture or static subluxation of the lumbar spine. Marked
right convex scoliosis.

## 2022-10-30 DIAGNOSIS — M9905 Segmental and somatic dysfunction of pelvic region: Secondary | ICD-10-CM | POA: Diagnosis not present

## 2022-10-30 DIAGNOSIS — M9903 Segmental and somatic dysfunction of lumbar region: Secondary | ICD-10-CM | POA: Diagnosis not present

## 2022-10-30 DIAGNOSIS — M4125 Other idiopathic scoliosis, thoracolumbar region: Secondary | ICD-10-CM | POA: Diagnosis not present

## 2022-10-30 DIAGNOSIS — M9902 Segmental and somatic dysfunction of thoracic region: Secondary | ICD-10-CM | POA: Diagnosis not present

## 2022-10-30 DIAGNOSIS — M9904 Segmental and somatic dysfunction of sacral region: Secondary | ICD-10-CM | POA: Diagnosis not present

## 2022-12-25 DIAGNOSIS — E039 Hypothyroidism, unspecified: Secondary | ICD-10-CM | POA: Diagnosis not present

## 2022-12-25 DIAGNOSIS — I10 Essential (primary) hypertension: Secondary | ICD-10-CM | POA: Diagnosis not present

## 2022-12-25 DIAGNOSIS — E785 Hyperlipidemia, unspecified: Secondary | ICD-10-CM | POA: Diagnosis not present

## 2022-12-25 DIAGNOSIS — Z23 Encounter for immunization: Secondary | ICD-10-CM | POA: Diagnosis not present

## 2022-12-25 DIAGNOSIS — E1169 Type 2 diabetes mellitus with other specified complication: Secondary | ICD-10-CM | POA: Diagnosis not present

## 2022-12-25 DIAGNOSIS — G2581 Restless legs syndrome: Secondary | ICD-10-CM | POA: Diagnosis not present

## 2022-12-25 DIAGNOSIS — K219 Gastro-esophageal reflux disease without esophagitis: Secondary | ICD-10-CM | POA: Diagnosis not present

## 2022-12-25 DIAGNOSIS — G4700 Insomnia, unspecified: Secondary | ICD-10-CM | POA: Diagnosis not present

## 2022-12-25 DIAGNOSIS — M810 Age-related osteoporosis without current pathological fracture: Secondary | ICD-10-CM | POA: Diagnosis not present

## 2022-12-25 DIAGNOSIS — D509 Iron deficiency anemia, unspecified: Secondary | ICD-10-CM | POA: Diagnosis not present

## 2023-02-05 DIAGNOSIS — M41126 Adolescent idiopathic scoliosis, lumbar region: Secondary | ICD-10-CM | POA: Diagnosis not present

## 2023-02-05 DIAGNOSIS — M41124 Adolescent idiopathic scoliosis, thoracic region: Secondary | ICD-10-CM | POA: Diagnosis not present

## 2023-02-05 DIAGNOSIS — M7918 Myalgia, other site: Secondary | ICD-10-CM | POA: Diagnosis not present

## 2023-02-06 DIAGNOSIS — E039 Hypothyroidism, unspecified: Secondary | ICD-10-CM | POA: Diagnosis not present

## 2023-02-24 DIAGNOSIS — G8911 Acute pain due to trauma: Secondary | ICD-10-CM | POA: Diagnosis not present

## 2023-02-24 DIAGNOSIS — M545 Low back pain, unspecified: Secondary | ICD-10-CM | POA: Diagnosis not present

## 2023-02-24 DIAGNOSIS — M25552 Pain in left hip: Secondary | ICD-10-CM | POA: Diagnosis not present

## 2023-04-22 DIAGNOSIS — M9904 Segmental and somatic dysfunction of sacral region: Secondary | ICD-10-CM | POA: Diagnosis not present

## 2023-04-22 DIAGNOSIS — M9905 Segmental and somatic dysfunction of pelvic region: Secondary | ICD-10-CM | POA: Diagnosis not present

## 2023-04-22 DIAGNOSIS — M4125 Other idiopathic scoliosis, thoracolumbar region: Secondary | ICD-10-CM | POA: Diagnosis not present

## 2023-04-22 DIAGNOSIS — M9902 Segmental and somatic dysfunction of thoracic region: Secondary | ICD-10-CM | POA: Diagnosis not present

## 2023-04-22 DIAGNOSIS — M9903 Segmental and somatic dysfunction of lumbar region: Secondary | ICD-10-CM | POA: Diagnosis not present

## 2023-05-07 DIAGNOSIS — M4125 Other idiopathic scoliosis, thoracolumbar region: Secondary | ICD-10-CM | POA: Diagnosis not present

## 2023-05-07 DIAGNOSIS — M9902 Segmental and somatic dysfunction of thoracic region: Secondary | ICD-10-CM | POA: Diagnosis not present

## 2023-05-07 DIAGNOSIS — M9903 Segmental and somatic dysfunction of lumbar region: Secondary | ICD-10-CM | POA: Diagnosis not present

## 2023-05-07 DIAGNOSIS — M9905 Segmental and somatic dysfunction of pelvic region: Secondary | ICD-10-CM | POA: Diagnosis not present

## 2023-05-07 DIAGNOSIS — M9904 Segmental and somatic dysfunction of sacral region: Secondary | ICD-10-CM | POA: Diagnosis not present

## 2023-05-27 DIAGNOSIS — M9903 Segmental and somatic dysfunction of lumbar region: Secondary | ICD-10-CM | POA: Diagnosis not present

## 2023-05-27 DIAGNOSIS — M4125 Other idiopathic scoliosis, thoracolumbar region: Secondary | ICD-10-CM | POA: Diagnosis not present

## 2023-05-27 DIAGNOSIS — M9905 Segmental and somatic dysfunction of pelvic region: Secondary | ICD-10-CM | POA: Diagnosis not present

## 2023-05-27 DIAGNOSIS — M9904 Segmental and somatic dysfunction of sacral region: Secondary | ICD-10-CM | POA: Diagnosis not present

## 2023-05-27 DIAGNOSIS — M9902 Segmental and somatic dysfunction of thoracic region: Secondary | ICD-10-CM | POA: Diagnosis not present

## 2023-06-03 DIAGNOSIS — M9904 Segmental and somatic dysfunction of sacral region: Secondary | ICD-10-CM | POA: Diagnosis not present

## 2023-06-03 DIAGNOSIS — M9903 Segmental and somatic dysfunction of lumbar region: Secondary | ICD-10-CM | POA: Diagnosis not present

## 2023-06-03 DIAGNOSIS — M9905 Segmental and somatic dysfunction of pelvic region: Secondary | ICD-10-CM | POA: Diagnosis not present

## 2023-06-03 DIAGNOSIS — M4125 Other idiopathic scoliosis, thoracolumbar region: Secondary | ICD-10-CM | POA: Diagnosis not present

## 2023-06-03 DIAGNOSIS — M9902 Segmental and somatic dysfunction of thoracic region: Secondary | ICD-10-CM | POA: Diagnosis not present

## 2023-07-02 DIAGNOSIS — I10 Essential (primary) hypertension: Secondary | ICD-10-CM | POA: Diagnosis not present

## 2023-07-02 DIAGNOSIS — D509 Iron deficiency anemia, unspecified: Secondary | ICD-10-CM | POA: Diagnosis not present

## 2023-07-02 DIAGNOSIS — G4700 Insomnia, unspecified: Secondary | ICD-10-CM | POA: Diagnosis not present

## 2023-07-02 DIAGNOSIS — G2581 Restless legs syndrome: Secondary | ICD-10-CM | POA: Diagnosis not present

## 2023-07-02 DIAGNOSIS — K219 Gastro-esophageal reflux disease without esophagitis: Secondary | ICD-10-CM | POA: Diagnosis not present

## 2023-07-02 DIAGNOSIS — Z9181 History of falling: Secondary | ICD-10-CM | POA: Diagnosis not present

## 2023-07-02 DIAGNOSIS — E785 Hyperlipidemia, unspecified: Secondary | ICD-10-CM | POA: Diagnosis not present

## 2023-07-02 DIAGNOSIS — E1169 Type 2 diabetes mellitus with other specified complication: Secondary | ICD-10-CM | POA: Diagnosis not present

## 2023-07-02 DIAGNOSIS — J309 Allergic rhinitis, unspecified: Secondary | ICD-10-CM | POA: Diagnosis not present

## 2023-07-02 DIAGNOSIS — E039 Hypothyroidism, unspecified: Secondary | ICD-10-CM | POA: Diagnosis not present

## 2023-07-02 DIAGNOSIS — M810 Age-related osteoporosis without current pathological fracture: Secondary | ICD-10-CM | POA: Diagnosis not present

## 2023-07-02 DIAGNOSIS — R399 Unspecified symptoms and signs involving the genitourinary system: Secondary | ICD-10-CM | POA: Diagnosis not present

## 2023-07-31 DIAGNOSIS — R102 Pelvic and perineal pain: Secondary | ICD-10-CM | POA: Diagnosis not present

## 2023-08-11 DIAGNOSIS — E039 Hypothyroidism, unspecified: Secondary | ICD-10-CM | POA: Diagnosis not present

## 2023-08-17 DIAGNOSIS — R1031 Right lower quadrant pain: Secondary | ICD-10-CM | POA: Diagnosis not present

## 2023-08-17 DIAGNOSIS — R300 Dysuria: Secondary | ICD-10-CM | POA: Diagnosis not present

## 2023-08-17 DIAGNOSIS — R54 Age-related physical debility: Secondary | ICD-10-CM | POA: Diagnosis not present

## 2023-08-17 DIAGNOSIS — R1032 Left lower quadrant pain: Secondary | ICD-10-CM | POA: Diagnosis not present

## 2023-08-20 DIAGNOSIS — N390 Urinary tract infection, site not specified: Secondary | ICD-10-CM | POA: Diagnosis not present

## 2023-08-20 DIAGNOSIS — R1031 Right lower quadrant pain: Secondary | ICD-10-CM | POA: Diagnosis not present

## 2023-08-20 DIAGNOSIS — K5730 Diverticulosis of large intestine without perforation or abscess without bleeding: Secondary | ICD-10-CM | POA: Diagnosis not present

## 2023-08-20 DIAGNOSIS — K449 Diaphragmatic hernia without obstruction or gangrene: Secondary | ICD-10-CM | POA: Diagnosis not present

## 2023-08-23 DIAGNOSIS — N390 Urinary tract infection, site not specified: Secondary | ICD-10-CM | POA: Diagnosis not present

## 2023-08-23 DIAGNOSIS — K449 Diaphragmatic hernia without obstruction or gangrene: Secondary | ICD-10-CM | POA: Diagnosis not present

## 2023-08-23 DIAGNOSIS — K5790 Diverticulosis of intestine, part unspecified, without perforation or abscess without bleeding: Secondary | ICD-10-CM | POA: Diagnosis not present

## 2023-08-24 DIAGNOSIS — M9905 Segmental and somatic dysfunction of pelvic region: Secondary | ICD-10-CM | POA: Diagnosis not present

## 2023-08-24 DIAGNOSIS — M9904 Segmental and somatic dysfunction of sacral region: Secondary | ICD-10-CM | POA: Diagnosis not present

## 2023-08-24 DIAGNOSIS — M9903 Segmental and somatic dysfunction of lumbar region: Secondary | ICD-10-CM | POA: Diagnosis not present

## 2023-08-24 DIAGNOSIS — M4125 Other idiopathic scoliosis, thoracolumbar region: Secondary | ICD-10-CM | POA: Diagnosis not present

## 2023-08-24 DIAGNOSIS — M9902 Segmental and somatic dysfunction of thoracic region: Secondary | ICD-10-CM | POA: Diagnosis not present

## 2023-09-17 DIAGNOSIS — M9902 Segmental and somatic dysfunction of thoracic region: Secondary | ICD-10-CM | POA: Diagnosis not present

## 2023-09-17 DIAGNOSIS — M9904 Segmental and somatic dysfunction of sacral region: Secondary | ICD-10-CM | POA: Diagnosis not present

## 2023-09-17 DIAGNOSIS — M4125 Other idiopathic scoliosis, thoracolumbar region: Secondary | ICD-10-CM | POA: Diagnosis not present

## 2023-09-17 DIAGNOSIS — M9905 Segmental and somatic dysfunction of pelvic region: Secondary | ICD-10-CM | POA: Diagnosis not present

## 2023-09-17 DIAGNOSIS — M9903 Segmental and somatic dysfunction of lumbar region: Secondary | ICD-10-CM | POA: Diagnosis not present

## 2023-09-22 DIAGNOSIS — N952 Postmenopausal atrophic vaginitis: Secondary | ICD-10-CM | POA: Diagnosis not present

## 2023-09-22 DIAGNOSIS — N390 Urinary tract infection, site not specified: Secondary | ICD-10-CM | POA: Diagnosis not present

## 2023-09-22 DIAGNOSIS — R300 Dysuria: Secondary | ICD-10-CM | POA: Diagnosis not present

## 2023-09-22 DIAGNOSIS — N3946 Mixed incontinence: Secondary | ICD-10-CM | POA: Diagnosis not present

## 2023-09-22 DIAGNOSIS — Z79899 Other long term (current) drug therapy: Secondary | ICD-10-CM | POA: Diagnosis not present

## 2023-10-13 DIAGNOSIS — R531 Weakness: Secondary | ICD-10-CM | POA: Diagnosis not present

## 2023-10-13 DIAGNOSIS — M810 Age-related osteoporosis without current pathological fracture: Secondary | ICD-10-CM | POA: Diagnosis not present

## 2023-10-13 DIAGNOSIS — Z23 Encounter for immunization: Secondary | ICD-10-CM | POA: Diagnosis not present

## 2023-11-05 DIAGNOSIS — R300 Dysuria: Secondary | ICD-10-CM | POA: Diagnosis not present

## 2023-11-05 DIAGNOSIS — N390 Urinary tract infection, site not specified: Secondary | ICD-10-CM | POA: Diagnosis not present

## 2023-11-05 DIAGNOSIS — N952 Postmenopausal atrophic vaginitis: Secondary | ICD-10-CM | POA: Diagnosis not present

## 2023-11-05 DIAGNOSIS — N3946 Mixed incontinence: Secondary | ICD-10-CM | POA: Diagnosis not present

## 2023-12-24 DIAGNOSIS — I10 Essential (primary) hypertension: Secondary | ICD-10-CM | POA: Diagnosis not present

## 2023-12-24 DIAGNOSIS — E1169 Type 2 diabetes mellitus with other specified complication: Secondary | ICD-10-CM | POA: Diagnosis not present

## 2023-12-24 DIAGNOSIS — M810 Age-related osteoporosis without current pathological fracture: Secondary | ICD-10-CM | POA: Diagnosis not present

## 2023-12-24 DIAGNOSIS — E039 Hypothyroidism, unspecified: Secondary | ICD-10-CM | POA: Diagnosis not present

## 2023-12-24 DIAGNOSIS — R0982 Postnasal drip: Secondary | ICD-10-CM | POA: Diagnosis not present

## 2023-12-24 DIAGNOSIS — F419 Anxiety disorder, unspecified: Secondary | ICD-10-CM | POA: Diagnosis not present

## 2023-12-24 DIAGNOSIS — J309 Allergic rhinitis, unspecified: Secondary | ICD-10-CM | POA: Diagnosis not present

## 2023-12-24 DIAGNOSIS — G4700 Insomnia, unspecified: Secondary | ICD-10-CM | POA: Diagnosis not present

## 2023-12-24 DIAGNOSIS — G2581 Restless legs syndrome: Secondary | ICD-10-CM | POA: Diagnosis not present

## 2023-12-24 DIAGNOSIS — K219 Gastro-esophageal reflux disease without esophagitis: Secondary | ICD-10-CM | POA: Diagnosis not present

## 2023-12-24 DIAGNOSIS — E785 Hyperlipidemia, unspecified: Secondary | ICD-10-CM | POA: Diagnosis not present

## 2023-12-24 DIAGNOSIS — D509 Iron deficiency anemia, unspecified: Secondary | ICD-10-CM | POA: Diagnosis not present
